# Patient Record
Sex: Female | Born: 1950 | Race: White | Hispanic: No | Marital: Married | Smoking: Never smoker
Health system: Southern US, Community
[De-identification: ages and names within clinical notes are randomized; demographics above are authoritative.]

## PROBLEM LIST (undated history)

## (undated) DIAGNOSIS — R87619 Unspecified abnormal cytological findings in specimens from cervix uteri: Secondary | ICD-10-CM

## (undated) DIAGNOSIS — E559 Vitamin D deficiency, unspecified: Secondary | ICD-10-CM

## (undated) DIAGNOSIS — Z860101 Personal history of adenomatous and serrated colon polyps: Secondary | ICD-10-CM

## (undated) DIAGNOSIS — I4891 Unspecified atrial fibrillation: Secondary | ICD-10-CM

## (undated) DIAGNOSIS — Z86010 Personal history of colonic polyps: Secondary | ICD-10-CM

## (undated) DIAGNOSIS — D72819 Decreased white blood cell count, unspecified: Secondary | ICD-10-CM

## (undated) DIAGNOSIS — M1990 Unspecified osteoarthritis, unspecified site: Secondary | ICD-10-CM

## (undated) DIAGNOSIS — G43009 Migraine without aura, not intractable, without status migrainosus: Secondary | ICD-10-CM

## (undated) DIAGNOSIS — E785 Hyperlipidemia, unspecified: Secondary | ICD-10-CM

## (undated) DIAGNOSIS — Z9889 Other specified postprocedural states: Secondary | ICD-10-CM

## (undated) DIAGNOSIS — M779 Enthesopathy, unspecified: Secondary | ICD-10-CM

## (undated) DIAGNOSIS — Z5189 Encounter for other specified aftercare: Secondary | ICD-10-CM

## (undated) DIAGNOSIS — N809 Endometriosis, unspecified: Secondary | ICD-10-CM

## (undated) HISTORY — DX: Migraine without aura, not intractable, without status migrainosus: G43.009

## (undated) HISTORY — DX: Decreased white blood cell count, unspecified: D72.819

## (undated) HISTORY — DX: Unspecified abnormal cytological findings in specimens from cervix uteri: R87.619

## (undated) HISTORY — DX: Vitamin D deficiency, unspecified: E55.9

## (undated) HISTORY — DX: Enthesopathy, unspecified: M77.9

## (undated) HISTORY — DX: Other specified postprocedural states: Z98.890

## (undated) HISTORY — PX: DIAGNOSTIC LAPAROSCOPY: SUR761

## (undated) HISTORY — DX: Hyperlipidemia, unspecified: E78.5

## (undated) HISTORY — DX: Endometriosis, unspecified: N80.9

## (undated) HISTORY — PX: POLYPECTOMY: SHX149

## (undated) HISTORY — DX: Unspecified atrial fibrillation: I48.91

## (undated) HISTORY — DX: Personal history of colonic polyps: Z86.010

## (undated) HISTORY — DX: Personal history of adenomatous and serrated colon polyps: Z86.0101

## (undated) HISTORY — DX: Encounter for other specified aftercare: Z51.89

## (undated) HISTORY — DX: Unspecified osteoarthritis, unspecified site: M19.90

---
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
## 1989-08-18 HISTORY — PX: TOTAL ABDOMINAL HYSTERECTOMY W/ BILATERAL SALPINGOOPHORECTOMY: SHX83

## 2000-11-05 ENCOUNTER — Other Ambulatory Visit: Admission: RE | Admit: 2000-11-05 | Discharge: 2000-11-05 | Payer: Self-pay | Admitting: Obstetrics and Gynecology

## 2000-11-06 ENCOUNTER — Encounter: Admission: RE | Admit: 2000-11-06 | Discharge: 2000-11-06 | Payer: Self-pay | Admitting: Obstetrics and Gynecology

## 2000-11-06 ENCOUNTER — Encounter: Payer: Self-pay | Admitting: Obstetrics and Gynecology

## 2001-12-12 ENCOUNTER — Encounter: Admission: RE | Admit: 2001-12-12 | Discharge: 2001-12-12 | Payer: Self-pay | Admitting: Obstetrics and Gynecology

## 2001-12-12 ENCOUNTER — Encounter: Payer: Self-pay | Admitting: Obstetrics and Gynecology

## 2002-12-22 ENCOUNTER — Ambulatory Visit (HOSPITAL_COMMUNITY): Admission: RE | Admit: 2002-12-22 | Discharge: 2002-12-22 | Payer: Self-pay | Admitting: *Deleted

## 2003-06-10 ENCOUNTER — Encounter: Admission: RE | Admit: 2003-06-10 | Discharge: 2003-06-10 | Payer: Self-pay | Admitting: Obstetrics and Gynecology

## 2003-06-10 ENCOUNTER — Encounter: Payer: Self-pay | Admitting: Obstetrics and Gynecology

## 2004-07-21 ENCOUNTER — Encounter: Admission: RE | Admit: 2004-07-21 | Discharge: 2004-07-21 | Payer: Self-pay | Admitting: Obstetrics and Gynecology

## 2005-08-22 ENCOUNTER — Encounter: Admission: RE | Admit: 2005-08-22 | Discharge: 2005-08-22 | Payer: Self-pay | Admitting: Obstetrics and Gynecology

## 2006-11-19 ENCOUNTER — Encounter: Admission: RE | Admit: 2006-11-19 | Discharge: 2006-11-19 | Payer: Self-pay | Admitting: Obstetrics and Gynecology

## 2006-11-29 ENCOUNTER — Encounter: Admission: RE | Admit: 2006-11-29 | Discharge: 2006-11-29 | Payer: Self-pay | Admitting: Obstetrics and Gynecology

## 2008-06-11 ENCOUNTER — Encounter: Admission: RE | Admit: 2008-06-11 | Discharge: 2008-06-11 | Payer: Self-pay | Admitting: Obstetrics and Gynecology

## 2009-01-12 ENCOUNTER — Encounter (INDEPENDENT_AMBULATORY_CARE_PROVIDER_SITE_OTHER): Payer: Self-pay | Admitting: *Deleted

## 2009-06-14 ENCOUNTER — Encounter: Admission: RE | Admit: 2009-06-14 | Discharge: 2009-06-14 | Payer: Self-pay | Admitting: Obstetrics and Gynecology

## 2009-08-02 ENCOUNTER — Encounter (INDEPENDENT_AMBULATORY_CARE_PROVIDER_SITE_OTHER): Payer: Self-pay | Admitting: *Deleted

## 2009-08-11 ENCOUNTER — Ambulatory Visit: Payer: Self-pay | Admitting: Gastroenterology

## 2009-08-23 ENCOUNTER — Ambulatory Visit: Payer: Self-pay | Admitting: Gastroenterology

## 2009-08-25 ENCOUNTER — Encounter: Payer: Self-pay | Admitting: Gastroenterology

## 2009-08-25 ENCOUNTER — Telehealth: Payer: Self-pay | Admitting: Gastroenterology

## 2010-06-27 ENCOUNTER — Telehealth: Payer: Self-pay | Admitting: Gastroenterology

## 2010-06-28 ENCOUNTER — Encounter: Admission: RE | Admit: 2010-06-28 | Discharge: 2010-06-28 | Payer: Self-pay | Admitting: Obstetrics and Gynecology

## 2010-07-15 ENCOUNTER — Encounter (INDEPENDENT_AMBULATORY_CARE_PROVIDER_SITE_OTHER): Payer: Self-pay | Admitting: *Deleted

## 2010-08-10 ENCOUNTER — Encounter (INDEPENDENT_AMBULATORY_CARE_PROVIDER_SITE_OTHER): Payer: Self-pay | Admitting: *Deleted

## 2010-08-15 ENCOUNTER — Ambulatory Visit: Payer: Self-pay | Admitting: Gastroenterology

## 2010-08-29 ENCOUNTER — Ambulatory Visit: Payer: Self-pay | Admitting: Gastroenterology

## 2010-08-30 ENCOUNTER — Encounter: Payer: Self-pay | Admitting: Gastroenterology

## 2010-10-09 ENCOUNTER — Encounter: Payer: Self-pay | Admitting: Obstetrics and Gynecology

## 2010-10-09 ENCOUNTER — Encounter: Payer: Self-pay | Admitting: Family Medicine

## 2010-10-18 NOTE — Letter (Signed)
Summary: Recall Colonoscopy Letter  [facility]  [address] [place], [place] [address]   Phone: [phone]  Fax: [phone]      January 12, 2009 MRN: [id number]   [facility] [address] [place], [place]  [address]   Dear Ms. Incorvaia,   According to your medical record, it is time for you to schedule a Colonoscopy. The American Cancer Society recommends this procedure as a method to detect early colon cancer. Patients with a family history of colon cancer, or a personal history of colon polyps or inflammatory bowel disease are at increased risk.  This letter has beeen generated based on the recommendations made at the time of your procedure. If you feel that in your particular situation this may no longer apply, please contact our office.  Please call our office at [phone] to schedule this appointment or to update your records at your earliest convenience.  Thank you for cooperating with [place] to provide you with the very best care possible.   Sincerely,  Vania Rea. Jarold Motto, M.D.  [facility] Gastroenter[facility] Division [phone]

## 2010-10-18 NOTE — Progress Notes (Signed)
Summary: rec COL?  Phone Note Call from Patient Call back at Home Phone [phone]   Caller: Patient Call For: Dr. Jarold Motto Reason for Call: Talk to Nurse Summary of Call: pt wants to know if her recall COL is due 2011 or 2013... two recall dates in [facility] Initial call taken by: Vallarie Mare,  June 27, 2010 8:54 AM  Follow-up for Phone Call        Dr Jarold Motto on her procedure from 08/23/2009. It says that she needs a recall in 2013 then you appened it to say follow up in one year.  when is her recall due?  Follow-up by: Harlow Mares CMA Duncan Dull),  June 28, 2010 8:08 AM  Additional Follow-up for Phone Call Additional follow up Details #1::        one year followup per family history and size of the polyp. Additional Follow-up by: Mardella Layman MD FACG,  June 30, 2010 11:51 AM    Additional Follow-up for Phone Call Additional follow up Details #2::    huband aware. I will send myself a flag to have her colon scheduled in December when that schedule comes out.  Follow-up by: Harlow Mares CMA Duncan Dull),  June 30, 2010 2:05 PM

## 2010-10-18 NOTE — Letter (Signed)
Summary: [facility] Instructions  [facility]  [address] [place], [place] [address]   Phone: [phone]  Fax: [phone]       STEPHENY CANAL    Jun 11, 1951    MRN: [id number]        Procedure Day /Date:   08/29/10   Monday     Arrival Time:  7:30am      Procedure Time:  8:30am     Location of Procedure:                    _x _  [facility] (4th Floor)                        PREPARATION FOR COLONOSCOPY WITH MOVIPREP   Starting 5 days prior to your procedure _12/7/11 _ do not eat nuts, seeds, popcorn, corn, beans, peas,  salads, or any raw vegetables.  Do not take any fiber supplements (e.g. Metamucil, Citrucel, and Benefiber).  THE DAY BEFORE YOUR PROCEDURE         DATE:  08/28/10   DAY:  Sunday  1.  Drink clear liquids the entire day-NO SOLID FOOD  2.  Do not drink anything colored red or purple.  Avoid juices with pulp.  No orange juice.  3.  Drink at least 64 oz. (8 glasses) of fluid/clear liquids during the day to prevent dehydration and help the prep work efficiently.  CLEAR LIQUIDS INCLUDE: Water Jello Ice Popsicles Tea (sugar ok, no milk/cream) Powdered fruit flavored drinks Coffee (sugar ok, no milk/cream) Gatorade Juice: apple, white grape, white cranberry  Lemonade Clear bullion, consomm, broth Carbonated beverages (any kind) Strained chicken noodle soup Hard Candy                             4.  In the morning, mix first dose of MoviPrep solution:    Empty 1 Pouch A and 1 Pouch B into the disposable container    Add lukewarm drinking water to the top line of the container. Mix to dissolve    Refrigerate (mixed solution should be used within 24 hrs)  5.  Begin drinking the prep at 5:00 p.m. The MoviPrep container is divided by 4 marks.   Every 15 minutes drink the solution down to the next mark (approximately 8 oz) until the full liter is complete.   6.  Follow completed prep with 16 oz of clear liquid of your choice  (Nothing red or purple).  Continue to drink clear liquids until bedtime.  7.  Before going to bed, mix second dose of MoviPrep solution:    Empty 1 Pouch A and 1 Pouch B into the disposable container    Add lukewarm drinking water to the top line of the container. Mix to dissolve    Refrigerate  THE DAY OF YOUR PROCEDURE      DATE:   DAY:  08/29/10   Monday  Beginning at  5:30am  (5 hours before procedure):         1. Every 15 minutes, drink the solution down to the next mark (approx 8 oz) until the full liter is complete.  2. Follow completed prep with 16 oz. of clear liquid of your choice.    3. You may drink clear liquids until _6:30am  _ (2 HOURS BEFORE PROCEDURE).   MEDICATION INSTRUCTIONS  Unless otherwise instructed, you should take regular prescription  medications with a small sip of water   as early as possible the morning of your procedure.           OTHER INSTRUCTIONS  You will need a responsible adult at least 60 years of age to accompany you and drive you home.   This person must remain in the waiting room during your procedure.  Wear loose fitting clothing that is easily removed.  Leave jewelry and other valuables at home.  However, you may wish to bring a book to read or  an iPod/MP3 player to listen to music as you wait for your procedure to start.  Remove all body piercing jewelry and leave at home.  Total time from sign-in until discharge is approximately 2-3 hours.  You should go home directly after your procedure and rest.  You can resume normal activities the  day after your procedure.  The day of your procedure you should not:   Drive   Make legal decisions   Operate machinery   Drink alcohol   Return to work  You will receive specific instructions about eating, activities and medications before you leave.    The above instructions have been reviewed and explained to me by   Sherren Kerns RN  August 15, 2010 1:22 PM     I  fully understand and can verbalize these instructions _____________________________ Date _________

## 2010-10-18 NOTE — Progress Notes (Signed)
Summary: Procedure f/i  Phone Note Call from Patient Call back at [phone]   Caller: Patient Call For: Dr. Jarold Motto Reason for Call: Talk to Nurse Summary of Call: Pt had colonoscopy on Monday and have a couple of questions regarding the procedure Initial call taken by: Karna Christmas,  August 25, 2009 4:50 PM  Follow-up for Phone Call        Pt states she was told after proc was done that she should not take anything other than tylenol for pain for one week.  Pt states she has a bad headache and tylenol does her no good.  Asking if she can take something like Motrin?  Had proc on Monday.   Polyp removed.   Follow-up by: Ashok Cordia RN,  August 26, 2009 8:22 AM  Additional Follow-up for Phone Call Additional follow up Details #1::        no nsaid's or asa...headache i care problem...tylonol should suffice... Additional Follow-up by: Mardella Layman MD [facility],  August 26, 2009 8:30 AM    Additional Follow-up for Phone Call Additional follow up Details #2::    Pt notified.  Discussed pathology report with pt also.   Follow-up by: Ashok Cordia RN,  August 26, 2009 8:51 AM

## 2010-10-18 NOTE — Miscellaneous (Signed)
Summary: previsit prep/rm  Clinical Lists Changes  Medications: Added new medication of MOVIPREP 100 GM  SOLR (PEG-KCL-NACL-NASULF-NA ASC-C) As per prep instructions. - Signed Rx of MOVIPREP 100 GM  SOLR (PEG-KCL-NACL-NASULF-NA ASC-C) As per prep instructions.;  #1 x 0;  Signed;  Entered by: Sherren Kerns RN;  Authorized by: Mardella Layman MD [facility];  Method used: Print then Give to Patient Observations: Added new observation of ALLERGY REV: Done (08/15/2010 12:57)    Prescriptions: MOVIPREP 100 GM  SOLR (PEG-KCL-NACL-NASULF-NA ASC-C) As per prep instructions.  #1 x 0   Entered by:   Sherren Kerns RN   Authorized by:   Mardella Layman MD [facility]   Signed by:   Sherren Kerns RN on 08/15/2010   Method used:   Print then Give to Patient   RxID:   [id number]

## 2010-10-18 NOTE — Letter (Signed)
Summary: Pre Visit Letter Revised  [facility]  [address] [place], [place] [address]   Phone: [phone]  Fax: [phone]        07/15/2010 MRN: [id number] Krista Henderson [address] [place], [place]  [address]  [place]             Procedure Date:  08/29/2010   Welcome to the Gastroenterology Division at [facility].    You are scheduled to see a nurse for your pre-procedure visit on 08/15/2010 at 1:00pm on the 3rd floor at [facility], [address]  We ask that you try to arrive at our office 15 minutes prior to your appointment time to allow for check-in.  Please take a minute to review the attached form.  If you answer "Yes" to one or more of the questions on the first page, we ask that you call the person listed at your earliest opportunity.  If you answer "No" to all of the questions, please complete the rest of the form and bring it to your appointment.    Your nurse visit will consist of discussing your medical and surgical history, your immediate family medical history, and your medications.   If you are unable to list all of your medications on the form, please bring the medication bottles to your appointment and we will list them.  We will need to be aware of both prescribed and over the counter drugs.  We will need to know exact dosage information as well.    Please be prepared to read and sign documents such as consent forms, a financial agreement, and acknowledgement forms.  If necessary, and with your consent, a friend or relative is welcome to sit-in on the nurse visit with you.  Please bring your insurance card so that we may make a copy of it.  If your insurance requires a referral to see a specialist, please bring your referral form from your primary care physician.  No co-pay is required for this nurse visit.     If you cannot keep your appointment, please call [phone] to cancel or reschedule prior to your appointment date.  This  allows [place] the opportunity to schedule an appointment for another patient in need of care.    Thank you for choosing [facility] for your medical needs.  We appreciate the opportunity to care for you.  Please visit [place] at our website  to learn more about our practice.  Sincerely, The Gastroenterology Division

## 2010-10-18 NOTE — Letter (Signed)
Summary: Patient Notice- Polyp Results   Gastroenterology  [address] [place], [place] [address]   Phone: [phone]  Fax: [phone]        August 25, 2009 MRN: [id number]    [facility] [address] [place], [place]  [address]    Dear Krista Henderson,  I am pleased to inform you that the colon polyp(s) removed during your recent colonoscopy was (were) found to be benign (no cancer detected) upon pathologic examination.  I recommend you have a repeat colonoscopy examination in 3_ years to look for recurrent polyps, as having colon polyps increases your risk for having recurrent polyps or even colon cancer in the future.  Should you develop new or worsening symptoms of abdominal pain, bowel habit changes or bleeding from the rectum or bowels, please schedule an evaluation with either your primary care physician or with me.  Additional information/recommendations:  xx__ No further action with gastroenterology is needed at this time. Please      follow-up with your primary care physician for your other healthcare      needs.  __ Please call [phone] to schedule a return visit to review your      situation.  __ Please keep your follow-up visit as already scheduled.  __ Continue treatment plan as outlined the day of your exam.  Please call us if you are having persistent problems or have questions about your condition that have not been fully answered at this time.  Sincerely,  Mardella Layman MD [facility]  This letter has been electronically signed by your physician.  Appended Document: Patient Notice- Polyp Results Letter mailed 12.09.10.

## 2010-10-18 NOTE — Miscellaneous (Signed)
Summary: [facility] Previsit/prep  Clinical Lists Changes  Medications: Added new medication of MOVIPREP 100 GM  SOLR (PEG-KCL-NACL-NASULF-NA ASC-C) As per prep instructions. - Signed Rx of MOVIPREP 100 GM  SOLR (PEG-KCL-NACL-NASULF-NA ASC-C) As per prep instructions.;  #1 x 0;  Signed;  Entered by: [name] RN;  Authorized by: Mardella Layman MD [facility];  Method used: Print then Give to Patient Allergies: Added new allergy or adverse reaction of CODEINE Observations: Added new observation of NKA: F (08/11/2009 11:04)    Prescriptions: MOVIPREP 100 GM  SOLR (PEG-KCL-NACL-NASULF-NA ASC-C) As per prep instructions.  #1 x 0   Entered by:   [name] RN   Authorized by:   Mardella Layman MD [facility]   Signed by:   [name] RN on 08/11/2009   Method used:   Print then Give to Patient   RxID:   [id number]

## 2010-10-18 NOTE — Letter (Signed)
Summary: [facility] Instructions  [facility]  [address] [place], [place] [address]   Phone: [phone]  Fax: [phone]       Krista Henderson    1951-03-19    MRN: [id number]        Procedure Day /Date: 08/23/2009     Arrival Time: 7:30 am      Procedure Time: 8:30am     Location of Procedure:                    [name] _  [name] (4th Floor)   PREPARATION FOR COLONOSCOPY WITH MOVIPREP   Starting 5 days prior to your procedure 08/18/09 do not eat nuts, seeds, popcorn, corn, beans, peas,  salads, or any raw vegetables.  Do not take any fiber supplements (e.g. Metamucil, Citrucel, and Benefiber).  THE DAY BEFORE YOUR PROCEDURE         DATE: 08/22/09  DAY: Sunday 1.  Drink clear liquids the entire day-NO SOLID FOOD  2.  Do not drink anything colored red or purple.  Avoid juices with pulp.  No orange juice.  3.  Drink at least 64 oz. (8 glasses) of fluid/clear liquids during the day to prevent dehydration and help the prep work efficiently.  CLEAR LIQUIDS INCLUDE: Water Jello Ice Popsicles Tea (sugar ok, no milk/cream) Powdered fruit flavored drinks Coffee (sugar ok, no milk/cream) Gatorade Juice: apple, white grape, white cranberry  Lemonade Clear bullion, consomm, broth Carbonated beverages (any kind) Strained chicken noodle soup Hard Candy                             4.  In the morning, mix first dose of MoviPrep solution:    Empty 1 Pouch A and 1 Pouch B into the disposable container    Add lukewarm drinking water to the top line of the container. Mix to dissolve    Refrigerate (mixed solution should be used within 24 hrs)  5.  Begin drinking the prep at 5:00 p.m. The MoviPrep container is divided by 4 marks.   Every 15 minutes drink the solution down to the next mark (approximately 8 oz) until the full liter is complete.   6.  Follow completed prep with 16 oz of clear liquid of your choice (Nothing red or purple).  Continue to  drink clear liquids until bedtime.  7.  Before going to bed, mix second dose of MoviPrep solution:    Empty 1 Pouch A and 1 Pouch B into the disposable container    Add lukewarm drinking water to the top line of the container. Mix to dissolve    Refrigerate  THE DAY OF YOUR PROCEDURE      DATE:   08/23/09 DAY: Monday  Beginning at 3:30 a.m. (5 hours before procedure):        1. Every 15 minutes, drink the solution down to the next mark (approx 8 oz) until the full liter is complete.  2. Follow completed prep with 16 oz. of clear liquid of your choice.    3. You may drink clear liquids until 6:30AM (2 HOURS BEFORE PROCEDURE).   MEDICATION INSTRUCTIONS  Unless otherwise instructed, you should take regular prescription medications with a small sip of water   as early as possible the morning of your procedure.           OTHER INSTRUCTIONS  You will need a responsible adult at least  60 years of age to accompany you and drive you home.   This person must remain in the waiting room during your procedure.  Wear loose fitting clothing that is easily removed.  Leave jewelry and other valuables at home.  However, you may wish to bring a book to read or  an iPod/MP3 player to listen to music as you wait for your procedure to start.  Remove all body piercing jewelry and leave at home.  Total time from sign-in until discharge is approximately 2-3 hours.  You should go home directly after your procedure and rest.  You can resume normal activities the  day after your procedure.  The day of your procedure you should not:   Drive   Make legal decisions   Operate machinery   Drink alcohol   Return to work  You will receive specific instructions about eating, activities and medications before you leave.    The above instructions have been reviewed and explained to me by   [name] RN  August 11, 2009 11:49 AM     I fully understand and can verbalize these  instructions _____________________________ Date _________

## 2010-10-20 NOTE — Procedures (Signed)
Summary: Colonoscopy  Patient: Krista Henderson Note: All result statuses are Final unless otherwise noted.  Tests: (1) Colonoscopy (COL)   COL Colonoscopy           DONE     [facility]     [address]     [place], [place]  [address]           COLONOSCOPY PROCEDURE REPORT           PATIENT:  Krista Henderson, Faith  MR#:  [id number]     BIRTHDATE:  06-12-1951, 59 yrs. old  GENDER:  female     ENDOSCOPIST:  Vania Rea. Jarold Motto, MD, [facility]     REF. BY:  Rudi Heap, M.D.     PROCEDURE DATE:  08/29/2010     PROCEDURE:  Colonoscopy with snare polypectomy     ASA CLASS:  Class I     INDICATIONS:  family history of colon cancer, history of     pre-cancerous (adenomatous) colon polyps     MEDICATIONS:   Fentanyl 75 mcg IV, Versed 8 mg IV           DESCRIPTION OF PROCEDURE:   After the risks benefits and     alternatives of the procedure were thoroughly explained, informed     consent was obtained.  Digital rectal exam was performed and     revealed no abnormalities.   The LB [id number] endoscope     was introduced through the anus and advanced to the cecum, which     was identified by both the appendix and ileocecal valve, without     limitations.  The quality of the prep was excellent, using     MoviPrep.  The instrument was then slowly withdrawn as the colon     was fully examined.     <<PROCEDUREIMAGES>>           FINDINGS:  There were multiple polyps identified and removed. in     the sigmoid to descending colon segments. 3-4 mm flat sessile     polyps snare excised.  Moderate diverticulosis was found in the     sigmoid to descending colon segments.   Retroflexed views in the     rectum revealed no abnormalities.    The scope was then withdrawn     from the patient and the procedure completed.           COMPLICATIONS:  None     ENDOSCOPIC IMPRESSION:     1) Polyps, multiple in the sigmoid to descending colon segments           2) Moderate diverticulosis in the sigmoid to  descending colon     segments     recurrent adenomas and ++FH OF COLON CA.     RECOMMENDATIONS:     1) Repeat Colonoscopy in 3 years.     2) high fiber diet     REPEAT EXAM:  No           ______________________________     Vania Rea. Jarold Motto, MD, [facility]           CC:           n.     eSIGNED:   Vania Rea. Patterson at 08/29/2010 09:04 AM           Leslee Home, [id number]  Note: An exclamation mark (!) indicates a result that was not dispersed into the flowsheet. Document Creation Date: 08/29/2010 9:05 AM _______________________________________________________________________  (  1) Order result status: Final Collection or observation date-time: 08/29/2010 08:56 Requested date-time:  Receipt date-time:  Reported date-time:  Referring Physician:   Ordering Physician: Sheryn Bison [phone]) Specimen Source:  Source: Launa Grill Order Number: [id number] Lab site:   Appended Document: Colonoscopy three-year followup  Appended Document: Colonoscopy     Procedures Next Due Date:    Colonoscopy: 08/2013

## 2010-10-20 NOTE — Letter (Signed)
Summary: Patient Notice- Polyp Results  [facility]  [address] [place], [place] [address]   Phone: [phone]  Fax: [phone]        August 30, 2010 MRN: [id number]    [facility] [address] [place], [place]  [address]    Dear Ms. Lesperance,  I am pleased to inform you that the colon polyp(s) removed during your recent colonoscopy was (were) found to be benign (no cancer detected) upon pathologic examination.  I recommend you have a repeat colonoscopy examination in 3_ years to look for recurrent polyps, as having colon polyps increases your risk for having recurrent polyps or even colon cancer in the future.  Should you develop new or worsening symptoms of abdominal pain, bowel habit changes or bleeding from the rectum or bowels, please schedule an evaluation with either your primary care physician or with me.  Additional information/recommendations:  _xx_ No further action with gastroenterology is needed at this time. Please      follow-up with your primary care physician for your other healthcare      needs.  __ Please call [phone] to schedule a return visit to review your      situation.  __ Please keep your follow-up visit as already scheduled.  __ Continue treatment plan as outlined the day of your exam.  Please call us if you are having persistent problems or have questions about your condition that have not been fully answered at this time.  Sincerely,  Mardella Layman MD [facility]  This letter has been electronically signed by your physician.  Appended Document: Patient Notice- Polyp Results Letter Mailed

## 2011-02-03 NOTE — Procedures (Signed)
NAME:  Krista Henderson, Krista Henderson                          ACCOUNT NO.:  [id number]   MEDICAL RECORD NO.:  [id number]                   PATIENT TYPE:  OUT   LOCATION:  [facility]                                 FACILITY:  [facility]   PHYSICIAN:  Vida Roller, M.D.                DATE OF BIRTH:  1950/10/03   DATE OF PROCEDURE:  12/22/2002  DATE OF DISCHARGE:                                  ECHOCARDIOGRAM   PROCEDURE:  Stress echocardiogram.   TAPE NUMBER:  [id number]   TAPE COUNT:  [id number]   CLINICAL INFORMATION:  This is a 60 year old woman with cardiac risk factors  of hyperlipidemia. She presented for atypical chest discomfort evaluation.   DETAILS OF THE PROCEDURE:  The patient was imaged in the supine position and  left lateral decubitus position where images of the left ventricle were  performed in the parasternal-long and parasternal-short, apical-4 and apical-  2 chamber views.  These views were used in a loop format as well as recorded  on tape for comparison with the stress images.  The patient then exercised  on a Bruce treadmill.  When she attained her maximum predicted heart rate,  she was immediately moved back to the echo table where echocardiographic  images were again obtained in the parasternal-long and parasternal-short and  apical-4 and apical-2 chambers; and these views were compared with the rest  of the views to assess for wall motion abnormality.   IMAGES:  The left ventricle is normal size with normal systolic function at  rest.  There are no wall motion abnormalities.  Overall ejection fraction is  estimated at 60%.  When the patient exercises on a Bruce protocol her left  ventricular chamber size decreases appropriately.  She has appropriate  augmentation of systolic function; and no significant stress induced wall  motion abnormalities are seen.   STRESS TEST:  The patient exercised on a Bruce protocol for 5-minutes  attaining 7 METS of exercise.  Her resting heart rate  went from 76 to 174  beats/minute which is 103% of her maximum predicted heart rate for her age.  Her blood pressure went from 142/88 to 180/92.  During that time she  experienced bilateral leg fatigue with no chest pain and no shortness of  breath. She had no evidence of arrythmia or ischemic ST-T wave changes on  her electrocardiogram.  The reason for stopping the test was fatigue and  attaining over 100% of the maximum predicted heart rate.   ASSESSMENT:  There is no evidence of hemodynamically significant coronary  artery disease.  The overall poor exercise tolerance; however, limits the  sensitivity and specificity of the test somewhat.  Vida Roller, M.D.    JH/MEDQ  D:  12/22/2002  T:  12/23/2002  Job:  [id number]

## 2011-02-03 NOTE — Procedures (Signed)
   NAME:  Krista Henderson, Krista Henderson                          ACCOUNT NO.:  [id number]   MEDICAL RECORD NO.:  [id number]                   PATIENT TYPE:  OUT   LOCATION:  [facility]                                 FACILITY:  [facility]   PHYSICIAN:  Jae Dire, P.A. [facility]               DATE OF BIRTH:  05-May-1951   DATE OF PROCEDURE:  DATE OF DISCHARGE:                                    STRESS TEST   PROCEDURE:  Stress echocardiogram   INDICATIONS:  Ms. Deane is a 60 year old female with no known coronary  artery disease.  She presented with atypical chest discomfort and minimal  cardiac risk factors.   BASELINE DATA:  EKG shows sinus rhythm at 76 beats/minute with nonspecific  ST abnormalities.  Her blood pressure is 142/88.   DESCRIPTION:  The patient exercised for a total of 5 minutes to 7.0 METS in  Bruce Protocol stage 2.   STRESS DATA:  1. Maximum heart rate was 274 beats/minute with is 103% of predicted     maximum.  2. Maximum blood pressure is 180/92.  3. EKG showed no arrhythmias and no ischemic changes.  4. Echocardiogram and final results are pending MD review.                                               Jae Dire, P.A. [facility]    AB/MEDQ  D:  12/22/2002  T:  12/22/2002  Job:  [id number]

## 2011-06-26 ENCOUNTER — Other Ambulatory Visit: Payer: Self-pay | Admitting: Obstetrics and Gynecology

## 2011-06-26 DIAGNOSIS — Z1231 Encounter for screening mammogram for malignant neoplasm of breast: Secondary | ICD-10-CM

## 2011-07-11 ENCOUNTER — Ambulatory Visit: Payer: Self-pay

## 2011-07-20 ENCOUNTER — Ambulatory Visit
Admission: RE | Admit: 2011-07-20 | Discharge: 2011-07-20 | Disposition: A | Discharge status: Home / Self Care | Payer: BC Managed Care – PPO | Source: Ambulatory Visit | Attending: Obstetrics and Gynecology | Admitting: Obstetrics and Gynecology

## 2011-07-20 DIAGNOSIS — Z1231 Encounter for screening mammogram for malignant neoplasm of breast: Secondary | ICD-10-CM

## 2011-07-25 ENCOUNTER — Other Ambulatory Visit: Payer: Self-pay | Admitting: Obstetrics and Gynecology

## 2011-07-25 DIAGNOSIS — R928 Other abnormal and inconclusive findings on diagnostic imaging of breast: Secondary | ICD-10-CM

## 2011-08-02 ENCOUNTER — Ambulatory Visit
Admission: RE | Admit: 2011-08-02 | Discharge: 2011-08-02 | Disposition: A | Discharge status: Home / Self Care | Payer: BC Managed Care – PPO | Source: Ambulatory Visit | Attending: Obstetrics and Gynecology | Admitting: Obstetrics and Gynecology

## 2011-08-02 DIAGNOSIS — R928 Other abnormal and inconclusive findings on diagnostic imaging of breast: Secondary | ICD-10-CM

## 2012-07-04 ENCOUNTER — Other Ambulatory Visit: Payer: Self-pay | Admitting: Obstetrics and Gynecology

## 2012-07-04 DIAGNOSIS — Z1231 Encounter for screening mammogram for malignant neoplasm of breast: Secondary | ICD-10-CM

## 2012-08-05 ENCOUNTER — Ambulatory Visit
Admission: RE | Admit: 2012-08-05 | Discharge: 2012-08-05 | Disposition: A | Discharge status: Home / Self Care | Payer: BC Managed Care – PPO | Source: Ambulatory Visit | Attending: Obstetrics and Gynecology | Admitting: Obstetrics and Gynecology

## 2012-08-05 DIAGNOSIS — Z1231 Encounter for screening mammogram for malignant neoplasm of breast: Secondary | ICD-10-CM

## 2012-08-13 ENCOUNTER — Telehealth: Payer: Self-pay | Admitting: Oncology

## 2012-08-13 NOTE — Telephone Encounter (Signed)
C/D 08/13/12 for appt.09/04/12

## 2012-08-13 NOTE — Telephone Encounter (Signed)
S/W PT IN REF TO NP APPT ON 09/04/12@1 :30 REFERRING DR Rudi Heap DX-LEUKOPENIA MAILED NP PACKET

## 2012-08-16 ENCOUNTER — Other Ambulatory Visit: Payer: Self-pay | Admitting: Oncology

## 2012-08-16 ENCOUNTER — Encounter: Payer: Self-pay | Admitting: Oncology

## 2012-08-16 DIAGNOSIS — D72819 Decreased white blood cell count, unspecified: Secondary | ICD-10-CM

## 2012-08-16 HISTORY — DX: Decreased white blood cell count, unspecified: D72.819

## 2012-09-04 ENCOUNTER — Other Ambulatory Visit (HOSPITAL_BASED_OUTPATIENT_CLINIC_OR_DEPARTMENT_OTHER): Payer: BC Managed Care – PPO | Admitting: Lab

## 2012-09-04 ENCOUNTER — Ambulatory Visit (HOSPITAL_BASED_OUTPATIENT_CLINIC_OR_DEPARTMENT_OTHER): Payer: BC Managed Care – PPO | Admitting: Oncology

## 2012-09-04 ENCOUNTER — Ambulatory Visit: Payer: BC Managed Care – PPO

## 2012-09-04 VITALS — BP 150/79 | HR 73 | Temp 98.2°F | Resp 20 | Ht 65.0 in | Wt 168.4 lb

## 2012-09-04 DIAGNOSIS — D72819 Decreased white blood cell count, unspecified: Secondary | ICD-10-CM

## 2012-09-04 DIAGNOSIS — K7460 Unspecified cirrhosis of liver: Secondary | ICD-10-CM

## 2012-09-04 LAB — CBC & DIFF AND RETIC
BASO%: 0.6 % (ref 0.0–2.0)
Basophils Absolute: 0 10*3/uL (ref 0.0–0.1)
EOS%: 1.4 % (ref 0.0–7.0)
Eosinophils Absolute: 0.1 10*3/uL (ref 0.0–0.5)
HCT: 35.7 % (ref 34.8–46.6)
HGB: 12.4 g/dL (ref 11.6–15.9)
Immature Retic Fract: 5.7 % (ref 1.60–10.00)
LYMPH%: 35.3 % (ref 14.0–49.7)
MCH: 32.5 pg (ref 25.1–34.0)
MCHC: 34.7 g/dL (ref 31.5–36.0)
MCV: 93.7 fL (ref 79.5–101.0)
MONO#: 0.2 10*3/uL (ref 0.1–0.9)
MONO%: 6.9 % (ref 0.0–14.0)
NEUT#: 1.9 10*3/uL (ref 1.5–6.5)
NEUT%: 55.8 % (ref 38.4–76.8)
Platelets: 165 10*3/uL (ref 145–400)
RBC: 3.81 10*6/uL (ref 3.70–5.45)
RDW: 12.1 % (ref 11.2–14.5)
Retic %: 1.7 % (ref 0.70–2.10)
Retic Ct Abs: 64.77 10*3/uL (ref 33.70–90.70)
WBC: 3.5 10*3/uL — ABNORMAL LOW (ref 3.9–10.3)
lymph#: 1.2 10*3/uL (ref 0.9–3.3)

## 2012-09-04 LAB — CHCC SMEAR

## 2012-09-04 LAB — MORPHOLOGY
PLT EST: ADEQUATE
RBC Comments: NORMAL

## 2012-09-04 NOTE — Progress Notes (Signed)
New Patient Hematology-Oncology Evaluation   Krista Henderson [id number] 1950-09-24 61 y.o. 09/04/2012  CC: Dr. 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 She has been leukopenic for at least 5 years. She brings laboratory data back as far as 03/08/2007 when total white count was 3000 with 55% neutrophils 36% lymphocytes 9 monocytes, hemoglobin 13, and platelet count 159,000. Fluctuated over Time with Lowest White Count Recorded 2600 Done Just Last Month on November 12. Hemoglobin and Platelets Have Remained Stable. Liver Chemistries Also Done November 13 Are Normal. She Has No History of Hepatitis Yellow Jaundice or Malaria but Did Have a Bad Case of Mononucleosis When She Was 61 Years Old. A Hepatitis Profile Done through Dr. Kathi [facility] Was Negative. She Has No Signs or Symptoms of a Collagen Vascular Disorder and Specifically denies polymyalgia, polyarthralgia, hair loss, skin rashes, or easy bruisability. 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 She did not do any of the spraying but did help harvest the tobacco. No exposure to organic solvents or therapeutic radiation. I took care of her mother who I initially thought that had a myelodysplastic syndrome characterized by thrombocytopenia and splenomegaly. As her situation even involved, it appeared that she actually had cirrhosis as the etiology of her splenomegaly.  She does not get frequent or severe infections.   PMH: Past Medical History  Diagnosis Date  . 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    WBC 3,000 08/30/11 52 P 43 L    No past surgical history on file.  Allergies: Allergies  Allergen Reactions  . Codeine     REACTION: rash/nausea/vomiting    Medications: Calcium and vitamin D supplements. Estrogen patch. GI intolerance  to codeine   Social History:  She is married. She had C-sections with both of her children a son currently age 73 and a daughter 35. They're both healthy. She does not smoke or use alcohol. She works as an Print production planner.  Family History: Father with history of prostate and colon cancer. Mother with history of cirrhosis and stroke. She is an only child.  Review of Systems: Constitutional symptoms: No constitutional symptoms HEENT: No sore throat Respiratory: No cough or dyspnea Cardiovascular:  No chest pain or palpitations Gastrointestinal ROS: No change in bowel habit Genito-Urinary ROS: No hematuria or vaginal bleeding Hematological and Lymphatic: No swollen glands Musculoskeletal: Some arthritis in her right foot status post previous fracture Neurologic: No headache or change in vision, no paresthesias Dermatologic: No rash or ecchymosis no easy bruisability Remaining ROS negative.  Physical Exam: Blood pressure 150/79, pulse 73, temperature 98.2 F (36.8 C), temperature source Oral, resp. rate 20, height 5\' 5"  (1.651 m), weight 168 lb 6.4 oz (76.386 kg). Wt Readings from Last 3 Encounters:  09/04/12 168 lb 6.4 oz (76.386 kg)    General appearance: Well-nourished Caucasian woman Head: Normal Neck: Normal Lymph nodes: No adenopathy Breasts: Lungs: Clear to auscultation resonant to percussion Heart: Regular rhythm no murmur Abdominal: Soft, obese, nontender, no mass, no organomegaly GU: Extremities: No edema, no calf tenderness Neurologic: No focal deficit Skin: No rash or ecchymosis    Lab Results: Lab Results  Component Value Date   WBC 3.5* 09/04/2012   HGB 12.4 09/04/2012   HCT 35.7 09/04/2012   MCV 93.7 09/04/2012   PLT 165 09/04/2012  Chemistry   No results found for this basename: NA, K, CL, CO2, BUN, CREATININE, GLU   No results found for this basename: CALCIUM, ALKPHOS, AST, ALT, BILITOT       Review of peripheral blood film: Normochromic  normocytic red cells, mature white cells, subpopulation of benign reactive, large granular lymphocytes. Platelets normal    Impression and Plan: Chronic, stable, leukopenia over at least a five-year interval of observation.  She gives a history of mononucleosis as a teenager. She has a subpopulation of large granular lymphocytes on review of the peripheral blood film. Although it is guilt by association, I believe her leukopenia is likely related to chronic suppression of white cells by previous mononucleosis infection. She's not getting any recurrent or severe infections. There are no progressive changes in her other cell lines over time. I doubt that she has a myelodysplastic syndrome. There are no signs or symptoms of a collagen vascular disorder.  Recommendation: Observation alone I will. see her back in one year.      Levert Feinstein, MD 09/04/2012, 3:21 PM

## 2012-09-05 ENCOUNTER — Telehealth: Payer: Self-pay | Admitting: Oncology

## 2012-09-05 LAB — EPSTEIN-BARR VIRUS EARLY D ANTIGEN ANTIBODY, IGG: EBV EA IgG: 8.4 U/mL (ref <9.0)

## 2012-09-05 LAB — EPSTEIN-BARR VIRUS NUCLEAR ANTIGEN ANTIBODY, IGG: EBV NA IgG: >600 U/mL — ABNORMAL HIGH (ref <18.0)

## 2012-09-05 LAB — EPSTEIN-BARR VIRUS VCA, IGG: EBV VCA IgG: 43 U/mL — ABNORMAL HIGH (ref <18.0)

## 2012-09-05 LAB — EPSTEIN-BARR VIRUS VCA, IGM: EBV VCA IgM: 15.5 U/mL (ref <36.0)

## 2012-09-05 NOTE — Telephone Encounter (Signed)
Called pt left message regarding lab and MD appt on December 2014

## 2012-09-06 ENCOUNTER — Telehealth: Payer: Self-pay | Admitting: *Deleted

## 2012-09-06 ENCOUNTER — Telehealth: Payer: Self-pay | Admitting: Oncology

## 2012-09-06 LAB — ANA: Anti Nuclear Antibody(ANA): NEGATIVE

## 2012-09-06 LAB — CYTOMEGALOVIRUS ANTIBODY, IGG: Cytomegalovirus Ab-IgG: >6 — ABNORMAL HIGH (ref <0.90)

## 2012-09-06 LAB — CMV IGM: CMV IgM: 0 (ref <0.90)

## 2012-09-06 NOTE — Telephone Encounter (Signed)
Pt called and wants to r/s appt from 09/02/13 to 09/16/13 lab and MD

## 2012-09-06 NOTE — Telephone Encounter (Signed)
Message left on identified vm per Dr. Patsy Lager instructions.

## 2012-09-06 NOTE — Telephone Encounter (Signed)
Message copied by Sabino Snipes on Fri Sep 06, 2012  4:39 PM ------      Message from: Levert Feinstein      Created: Fri Sep 06, 2012  8:34 AM       Call pt - evidence of exposure to mononucleosis (which we know already) but no signs of active infection. No change in plan which is observation alone

## 2013-01-14 ENCOUNTER — Telehealth: Payer: Self-pay | Admitting: Obstetrics and Gynecology

## 2013-01-14 DIAGNOSIS — D72819 Decreased white blood cell count, unspecified: Secondary | ICD-10-CM

## 2013-01-14 MED ORDER — ESTRADIOL 0.0375 MG/24HR TD PTTW: 1.0000 | MEDICATED_PATCH | TRANSDERMAL | Status: DC

## 2013-01-14 NOTE — Telephone Encounter (Signed)
Pt needing rx for vivelle dot 3 month supply to [facility] at [phone]

## 2013-01-14 NOTE — Telephone Encounter (Signed)
Patient was given ok for 1 year of refills and samples at 07/10/12 visit.

## 2013-05-08 ENCOUNTER — Telehealth: Payer: Self-pay | Admitting: Obstetrics and Gynecology

## 2013-05-08 NOTE — Telephone Encounter (Addendum)
Patient is asking is Krista Henderson has gone generic? Patient has a discount card for [facility] and needs a prescription called to  [facility]. [place], [place] [phone])

## 2013-05-09 ENCOUNTER — Other Ambulatory Visit: Payer: Self-pay | Admitting: *Deleted

## 2013-05-09 MED ORDER — ESTRADIOL 0.0375 MG/24HR TD PTTW: 1.0000 | MEDICATED_PATCH | TRANSDERMAL | Status: DC

## 2013-05-09 NOTE — Telephone Encounter (Signed)
Spoke with pt whose Aex is due in October. Pt needs her hormone patch refilled, and is wondering if vivelle has gone generic.One month of vivelle is $101.  Pt's husband's job has ended and she no longer has Rx insurance. Pt needs cheapest alternative. Pt reports minivelle with a discount card will be $43.88 for a month's supply. Pt has not tried minivelle yet and is not sure she will even like it. Pt wondering if we could give her a sample to try. Advised pt that vivelle has gone generic, but I am not sure what the cost would be. Pt will call pharmacy and check. OK for pt to try sample of minivelle?

## 2013-05-09 NOTE — Telephone Encounter (Signed)
It would be fine to give her a month of minivelle samples, if we have them.  As an alternative, she could consider po estrogen.  The generic pill is on the $4 list at [facility] and [facility] and probably many other pharmacies.  It's disadvantage is that it can raise triglyceride levels (the fats in the blood) and carries a small risk of increasing her change of having a blood clot (can happen in leg, lung, heart, or brain).  Many Many women do fine on the pill, and the risks are less than those associated with regular birth control pills.  Her choice.

## 2013-05-09 NOTE — Telephone Encounter (Signed)
Samples at front desk to pick up by patient. Left message for patient to call our office for further information concerning samples of minivelle patches.

## 2013-06-24 ENCOUNTER — Encounter: Payer: Self-pay | Admitting: Gastroenterology

## 2013-07-31 ENCOUNTER — Ambulatory Visit: Payer: Self-pay | Admitting: Obstetrics and Gynecology

## 2013-08-07 ENCOUNTER — Other Ambulatory Visit: Payer: Self-pay | Admitting: Family Medicine

## 2013-08-08 ENCOUNTER — Other Ambulatory Visit: Payer: Self-pay

## 2013-08-08 DIAGNOSIS — Z1231 Encounter for screening mammogram for malignant neoplasm of breast: Secondary | ICD-10-CM

## 2013-08-11 NOTE — Telephone Encounter (Signed)
Last seen in 2013, level was 39

## 2013-08-13 ENCOUNTER — Encounter: Payer: Self-pay | Admitting: Family Medicine

## 2013-08-19 ENCOUNTER — Other Ambulatory Visit: Payer: Self-pay

## 2013-08-20 ENCOUNTER — Other Ambulatory Visit (INDEPENDENT_AMBULATORY_CARE_PROVIDER_SITE_OTHER): Payer: BC Managed Care – PPO

## 2013-08-20 ENCOUNTER — Telehealth: Payer: Self-pay | Admitting: *Deleted

## 2013-08-20 DIAGNOSIS — Z0000 Encounter for general adult medical examination without abnormal findings: Secondary | ICD-10-CM

## 2013-08-20 LAB — POCT CBC
Granulocyte percent: 55.7 %G (ref 37–80)
HCT, POC: 39.3 % (ref 37.7–47.9)
Hemoglobin: 13.1 g/dL (ref 12.2–16.2)
Lymph, poc: 1.2 (ref 0.6–3.4)
MCH, POC: 32.1 pg — AB (ref 27–31.2)
MCHC: 33.4 g/dL (ref 31.8–35.4)
MCV: 96 fL (ref 80–97)
MPV: 8.4 fL (ref 0–99.8)
POC Granulocyte: 1.7 — AB (ref 2–6.9)
POC LYMPH PERCENT: 40.2 %L (ref 10–50)
Platelet Count, POC: 173 10*3/uL (ref 142–424)
RBC: 4.1 M/uL (ref 4.04–5.48)
RDW, POC: 12.3 %
WBC: 3.1 10*3/uL — AB (ref 4.6–10.2)

## 2013-08-20 NOTE — Telephone Encounter (Signed)
Pt notified and verbalized understanding.

## 2013-08-20 NOTE — Telephone Encounter (Signed)
Message copied by Baltazar Apo on Wed Aug 20, 2013 11:08 AM ------      Message from: Ernestina Penna      Created: Wed Aug 20, 2013  9:54 AM       CBC is within normal limits with a slightly decreased white blood cell count, a good hemoglobin of 13.1, and adequate platelets ------

## 2013-08-20 NOTE — Progress Notes (Signed)
Patient came in for labs only.

## 2013-08-22 LAB — BMP8+EGFR
BUN/Creatinine Ratio: 15 (ref 11–26)
BUN: 13 mg/dL (ref 8–27)
CO2: 22 mmol/L (ref 18–29)
Calcium: 9.3 mg/dL (ref 8.6–10.2)
Chloride: 101 mmol/L (ref 97–108)
Creatinine, Ser: 0.86 mg/dL (ref 0.57–1.00)
GFR calc Af Amer: 84 mL/min/{1.73_m2} (ref >59)
GFR calc non Af Amer: 73 mL/min/{1.73_m2} (ref >59)
Glucose: 103 mg/dL — ABNORMAL HIGH (ref 65–99)
Potassium: 4.3 mmol/L (ref 3.5–5.2)
Sodium: 140 mmol/L (ref 134–144)

## 2013-08-22 LAB — HEPATIC FUNCTION PANEL
ALT: 14 IU/L (ref 0–32)
AST: 17 IU/L (ref 0–40)
Albumin: 4.3 g/dL (ref 3.6–4.8)
Alkaline Phosphatase: 73 IU/L (ref 39–117)
Bilirubin, Direct: 0.19 mg/dL (ref 0.00–0.40)
Total Bilirubin: 0.9 mg/dL (ref 0.0–1.2)
Total Protein: 6.3 g/dL (ref 6.0–8.5)

## 2013-08-22 LAB — NMR, LIPOPROFILE
Cholesterol: 210 mg/dL — ABNORMAL HIGH (ref <200)
HDL Cholesterol by NMR: 57 mg/dL (ref >=40)
HDL Particle Number: 40.5 umol/L (ref >=30.5)
LDL Particle Number: 1702 nmol/L — ABNORMAL HIGH (ref <1000)
LDL Size: 21.3 nm (ref >20.5)
LDLC SERPL CALC-MCNC: 115 mg/dL — ABNORMAL HIGH (ref <100)
LP-IR Score: 31 (ref <=45)
Small LDL Particle Number: 671 nmol/L — ABNORMAL HIGH (ref <=527)
Triglycerides by NMR: 190 mg/dL — ABNORMAL HIGH (ref <150)

## 2013-08-22 LAB — VITAMIN D 25 HYDROXY (VIT D DEFICIENCY, FRACTURES): Vit D, 25-Hydroxy: 23.1 ng/mL — ABNORMAL LOW (ref 30.0–100.0)

## 2013-08-26 ENCOUNTER — Telehealth: Payer: Self-pay | Admitting: *Deleted

## 2013-08-26 NOTE — Telephone Encounter (Signed)
Message copied by Monica Becton on Tue Aug 26, 2013  5:19 PM ------      Message from: Ernestina Penna      Created: Fri Aug 22, 2013  1:48 PM       The. blood sugar is elevated at 103. Kidney function is good. Electrolytes including potassium are within normal limits.      All liver function tests are within normal limits      On advanced lipid testing, a total LDL [place] number is elevated at 09/24/2000. This number should be less than 1000. The LDL C. is elevated at 115. The triglycerides are elevated at 190 and these need to be much lower ideally less than 100. The small LDL particle number is also elevated. The HDL particle number, which is the good cholesterol particle is very good.0------- the patient really needs to be started on a statin drug, please let me know if she is willing to do that. If she is to start Crestor 10 one daily. Give her a coupon that will save money. If she is willing to do this she must have her liver function tests checked 4 weeks after starting the medication. She must continue to do aggressive therapeutic lifestyle changes which include diet and exercise. She must also try to watch her carbohydrate intake more as this will help bring the sugar and the triglycerides down.      The vitamin D level is very low-------------- please confirm her current treatment. She should be taking vitamin D 50,000 and units once weekly #12 one refill. Recheck vitamin D level in 3 months ------

## 2013-08-27 ENCOUNTER — Telehealth: Payer: Self-pay | Admitting: *Deleted

## 2013-08-27 ENCOUNTER — Encounter: Payer: Self-pay | Admitting: Family Medicine

## 2013-08-27 NOTE — Telephone Encounter (Signed)
Message copied by Magdalene River on Wed Aug 27, 2013 10:47 AM ------      Message from: Ernestina Penna      Created: Fri Aug 22, 2013  1:48 PM       The. blood sugar is elevated at 103. Kidney function is good. Electrolytes including potassium are within normal limits.      All liver function tests are within normal limits      On advanced lipid testing, a total LDL [place] number is elevated at 09/24/2000. This number should be less than 1000. The LDL C. is elevated at 115. The triglycerides are elevated at 190 and these need to be much lower ideally less than 100. The small LDL particle number is also elevated. The HDL particle number, which is the good cholesterol particle is very good.0------- the patient really needs to be started on a statin drug, please let me know if she is willing to do that. If she is to start Crestor 10 one daily. Give her a coupon that will save money. If she is willing to do this she must have her liver function tests checked 4 weeks after starting the medication. She must continue to do aggressive therapeutic lifestyle changes which include diet and exercise. She must also try to watch her carbohydrate intake more as this will help bring the sugar and the triglycerides down.      The vitamin D level is very low-------------- please confirm her current treatment. She should be taking vitamin D 50,000 and units once weekly #12 one refill. Recheck vitamin D level in 3 months ------

## 2013-08-27 NOTE — Telephone Encounter (Signed)
LM TO GO OVER LABS 

## 2013-08-28 ENCOUNTER — Encounter: Payer: Self-pay | Admitting: Family Medicine

## 2013-09-01 ENCOUNTER — Encounter: Payer: BC Managed Care – PPO | Admitting: Gastroenterology

## 2013-09-02 ENCOUNTER — Telehealth: Payer: Self-pay | Admitting: Family Medicine

## 2013-09-02 ENCOUNTER — Ambulatory Visit: Payer: BC Managed Care – PPO | Admitting: Oncology

## 2013-09-02 ENCOUNTER — Other Ambulatory Visit: Payer: BC Managed Care – PPO | Admitting: Lab

## 2013-09-02 ENCOUNTER — Ambulatory Visit (INDEPENDENT_AMBULATORY_CARE_PROVIDER_SITE_OTHER): Payer: BC Managed Care – PPO

## 2013-09-02 ENCOUNTER — Encounter: Payer: Self-pay | Admitting: Family Medicine

## 2013-09-02 ENCOUNTER — Ambulatory Visit (INDEPENDENT_AMBULATORY_CARE_PROVIDER_SITE_OTHER): Payer: BC Managed Care – PPO | Admitting: Family Medicine

## 2013-09-02 VITALS — BP 145/89 | HR 81 | Temp 98.9°F | Ht 65.0 in | Wt 168.0 lb

## 2013-09-02 DIAGNOSIS — D72819 Decreased white blood cell count, unspecified: Secondary | ICD-10-CM

## 2013-09-02 DIAGNOSIS — Z0000 Encounter for general adult medical examination without abnormal findings: Secondary | ICD-10-CM

## 2013-09-02 DIAGNOSIS — E559 Vitamin D deficiency, unspecified: Secondary | ICD-10-CM | POA: Insufficient documentation

## 2013-09-02 DIAGNOSIS — E785 Hyperlipidemia, unspecified: Secondary | ICD-10-CM | POA: Insufficient documentation

## 2013-09-02 DIAGNOSIS — Z780 Asymptomatic menopausal state: Secondary | ICD-10-CM

## 2013-09-02 DIAGNOSIS — M79672 Pain in left foot: Secondary | ICD-10-CM

## 2013-09-02 NOTE — Telephone Encounter (Signed)
VERIFIED W/ INS DEPT- DEXASCANS NOT COVERED UNDER PREVENTIVE.  PT AWARE.  RS

## 2013-09-02 NOTE — Patient Instructions (Addendum)
Continue current medications. Continue good therapeutic lifestyle changes which include good diet and exercise. Fall precautions discussed with patient. Schedule your flu vaccine if you haven't had it yet If you are over 62 years old - you may need Prevnar 13 or the adult Pneumonia vaccine. Please  consider starting a statin drug--continue with your aggressive therapeutic lifestyle changes, watching your diet and carb intake Followup with your gynecologist, the hematologist, and the gastroenterologist for your colonoscopy. Use a heel cup for your he'll and if the pain continues we may need to do x-rays of your heel to see if there could be any spurs causing the problem

## 2013-09-02 NOTE — Progress Notes (Addendum)
Subjective:    Patient ID: Krista Henderson, female    DOB: Jun 26, 1951, 62 y.o.   MRN: [id number]  HPI Pt here for follow up and management of chronic medical problems. Patient has a history of hyperlipidemia, estrogen replacement therapy and vitamin D deficiency. She also has a history of leukopenia.rrecent labs were reviewed with patient and the findings were discussed. She had understanding of this.     Patient Active Problem List   Diagnosis Date Noted  . 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   Outpatient Encounter Prescriptions as of 09/02/2013  Medication Sig  . Calcium Carbonate-Vitamin D 600-400 MG-UNIT per tablet Take 1 tablet by mouth daily. With magnesium  . estradiol (VIVELLE-DOT) 0.0375 MG/24HR Place 1 patch onto the skin 2 (two) times a week.  . Vitamin D, Ergocalciferol, (DRISDOL) 50000 UNITS CAPS capsule TAKE 1 CAPSULE BY MOUTH EVERY WEEK  . [DISCONTINUED] estradiol (VIVELLE-DOT) 0.0375 MG/24HR Place 1 patch onto the skin 2 (two) times a week. Samles given 05/09/2013 Lot # [id number]  Exp. 10/2013  X 2 boxes    Review of Systems  Constitutional: Negative.   HENT: Negative.   Eyes: Negative.   Respiratory: Negative.   Cardiovascular: Negative.   Gastrointestinal: Negative.   Endocrine: Negative.   Genitourinary: Negative.   Musculoskeletal: Negative.        Left heel pain  Skin: Negative.   Allergic/Immunologic: Negative.   Neurological: Negative.   Hematological: Negative.   Psychiatric/Behavioral: Negative.        Objective:   Physical Exam  Nursing note and vitals reviewed. Constitutional: She is oriented to person, place, and time. She appears well-developed and well-nourished. No distress.  HENT:  Head: Normocephalic and atraumatic.  Right Ear: External ear normal.  Left Ear: External ear normal.  Mouth/Throat: Oropharynx is clear and moist. No oropharyngeal exudate.  Nasal congestion bilateral  Eyes: Conjunctivae and EOM are normal. Pupils are equal, round, and  reactive to light. Right eye exhibits no discharge. Left eye exhibits no discharge. No scleral icterus.  Neck: Normal range of motion. Neck supple. No JVD present. No thyromegaly present.  Cardiovascular: Normal rate, regular rhythm, normal heart sounds and intact distal pulses.  Exam reveals no gallop and no friction rub.   No murmur heard. At 72 per minute  Pulmonary/Chest: Effort normal and breath sounds normal. No respiratory distress. She has no wheezes. She has no rales. She exhibits no tenderness.  Abdominal: Soft. Bowel sounds are normal. She exhibits no mass. There is no tenderness. There is no rebound and no guarding.  Musculoskeletal: Normal range of motion. She exhibits no edema and no tenderness.  Lymphadenopathy:    She has no cervical adenopathy.  Neurological: She is alert and oriented to person, place, and time. She has normal reflexes. No cranial nerve deficit.  Skin: Skin is warm and dry.  Psychiatric: She has a normal mood and affect. Her behavior is normal. Judgment and thought content normal.   BP 145/89  Pulse 81  Temp(Src) 98.9 F (37.2 C) (Oral)  Ht 5\' 5"  (1.651 m)  Wt 168 lb (76.204 kg)  BMI 27.96 kg/m2  [facility] reading (PRIMARY) by  Dr.Moore;-Chest x-ray shows degenerative changes and scoliosis                                        Assessment & Plan:  1. Vitamin D deficiency  2. Hyperlipidemia  3. Leukopenia  4. Annual physical exam  No orders of the defined types were placed in this encounter.   Patient Instructions  Continue current medications. Continue good therapeutic lifestyle changes which include good diet and exercise. Fall precautions discussed with patient. Schedule your flu vaccine if you haven't had it yet If you are over 74 years old - you may need Prevnar 13 or the adult Pneumonia vaccine. Please  consider starting a statin drug--continue with your aggressive therapeutic lifestyle changes, watching your diet and carb  intake Followup with your gynecologist, the hematologist, and the gastroenterologist for your colonoscopy. Use a heel cup for your he'll and if the pain continues we may need to do x-rays of your heel to see if there could be any spurs causing the problem   Nyra Capes MD

## 2013-09-03 ENCOUNTER — Ambulatory Visit: Payer: BC Managed Care – PPO

## 2013-09-03 ENCOUNTER — Other Ambulatory Visit: Payer: BC Managed Care – PPO

## 2013-09-03 ENCOUNTER — Ambulatory Visit: Payer: Self-pay | Admitting: Gynecology

## 2013-09-03 ENCOUNTER — Telehealth: Payer: Self-pay | Admitting: *Deleted

## 2013-09-03 ENCOUNTER — Other Ambulatory Visit: Payer: Self-pay | Admitting: *Deleted

## 2013-09-03 DIAGNOSIS — D72819 Decreased white blood cell count, unspecified: Secondary | ICD-10-CM

## 2013-09-03 NOTE — Telephone Encounter (Signed)
Pt was scheduled for her annual exam 09/03/13 appt was cancelled and not rescheduled.

## 2013-09-03 NOTE — Telephone Encounter (Signed)
Pt was scheduled for appt 09/03/13,  but was cancelled was not rescheduled.

## 2013-09-05 ENCOUNTER — Telehealth: Payer: Self-pay

## 2013-09-05 MED ORDER — ESTRADIOL 0.0375 MG/24HR TD PTTW: 1.0000 | MEDICATED_PATCH | TRANSDERMAL | Status: DC

## 2013-09-05 NOTE — Telephone Encounter (Signed)
[facility] to [phone] 

## 2013-09-08 ENCOUNTER — Ambulatory Visit: Payer: BC Managed Care – PPO

## 2013-09-09 NOTE — Telephone Encounter (Signed)
Patient said that only 2 months of Vivelle Dot was called in; she needs 3 months (90 day) in order to get discount.

## 2013-09-10 NOTE — Telephone Encounter (Signed)
Pt has an Annual Exam scheduled for 09/2013 a Rx was sent to [facility] for Vivelle Dot #8 x 1 refill Pt says her Rx for Vivelle Dot cost has increased, it is much cheaper if she gets a 90 day supply. So Rx was called in for a 90 day supply.

## 2013-09-15 ENCOUNTER — Other Ambulatory Visit: Payer: BC Managed Care – PPO | Admitting: Lab

## 2013-09-16 ENCOUNTER — Ambulatory Visit: Payer: BC Managed Care – PPO | Admitting: Oncology

## 2013-09-16 ENCOUNTER — Other Ambulatory Visit: Payer: BC Managed Care – PPO | Admitting: Lab

## 2013-09-22 ENCOUNTER — Ambulatory Visit: Payer: BC Managed Care – PPO

## 2013-09-22 ENCOUNTER — Other Ambulatory Visit: Payer: Self-pay

## 2013-09-22 ENCOUNTER — Telehealth: Payer: Self-pay | Admitting: Family Medicine

## 2013-09-22 DIAGNOSIS — J069 Acute upper respiratory infection, unspecified: Secondary | ICD-10-CM

## 2013-09-23 MED ORDER — AMOXICILLIN 500 MG PO CAPS: 500.0000 mg | ORAL_CAPSULE | Freq: Three times a day (TID) | ORAL | Status: DC

## 2013-09-23 NOTE — Telephone Encounter (Signed)
Patient called in complaining of ear pain and yellow drainage and wanted an antibiotic called into the local drugstore She was called and notified that [facility] [place]

## 2013-09-24 ENCOUNTER — Telehealth: Payer: Self-pay | Admitting: Oncology

## 2013-09-24 NOTE — Telephone Encounter (Signed)
Pt called and change appt to February 2015 per pt rqst  due to her colonoscopy

## 2013-10-01 ENCOUNTER — Ambulatory Visit: Payer: Self-pay | Admitting: Gynecology

## 2013-10-01 ENCOUNTER — Encounter: Payer: Self-pay | Admitting: Family Medicine

## 2013-10-02 ENCOUNTER — Encounter: Payer: Self-pay | Admitting: Internal Medicine

## 2013-10-02 ENCOUNTER — Ambulatory Visit (AMBULATORY_SURGERY_CENTER): Payer: Self-pay

## 2013-10-02 VITALS — Ht 65.0 in | Wt 165.0 lb

## 2013-10-02 DIAGNOSIS — Z860100 Personal history of colon polyps, unspecified: Secondary | ICD-10-CM

## 2013-10-02 MED ORDER — MOVIPREP 100 G PO SOLR: 1.0000 | Freq: Once | ORAL | Status: DC

## 2013-10-06 ENCOUNTER — Encounter: Payer: BC Managed Care – PPO | Admitting: Gastroenterology

## 2013-10-08 ENCOUNTER — Ambulatory Visit
Admission: RE | Admit: 2013-10-08 | Discharge: 2013-10-08 | Disposition: A | Discharge status: Home / Self Care | Payer: BC Managed Care – PPO | Source: Ambulatory Visit

## 2013-10-08 DIAGNOSIS — Z1231 Encounter for screening mammogram for malignant neoplasm of breast: Secondary | ICD-10-CM

## 2013-10-09 NOTE — Telephone Encounter (Signed)
Please review/sign/close encounter.

## 2013-10-13 ENCOUNTER — Encounter: Payer: Self-pay | Admitting: Gastroenterology

## 2013-10-13 ENCOUNTER — Ambulatory Visit (AMBULATORY_SURGERY_CENTER): Payer: BC Managed Care – PPO | Admitting: Gastroenterology

## 2013-10-13 VITALS — BP 127/64 | HR 57 | Temp 98.0°F | Resp 18 | Ht 65.0 in | Wt 165.0 lb

## 2013-10-13 DIAGNOSIS — Z800 Family history of malignant neoplasm of digestive organs: Secondary | ICD-10-CM

## 2013-10-13 DIAGNOSIS — Z860100 Personal history of colon polyps, unspecified: Secondary | ICD-10-CM

## 2013-10-13 DIAGNOSIS — Z1211 Encounter for screening for malignant neoplasm of colon: Secondary | ICD-10-CM

## 2013-10-13 HISTORY — PX: COLONOSCOPY: SHX174

## 2013-10-13 MED ORDER — SODIUM CHLORIDE 0.9 % IV SOLN: 500.0000 mL | INTRAVENOUS | Status: DC

## 2013-10-13 NOTE — Patient Instructions (Signed)
YOU HAD AN ENDOSCOPIC PROCEDURE TODAY AT THE [facility]: Refer to the procedure report that was given to you for any specific questions about what was found during the examination.  If the procedure report does not answer your questions, please call your gastroenterologist to clarify.  If you requested that your care partner not be given the details of your procedure findings, then the procedure report has been included in a sealed envelope for you to review at your convenience later.  YOU SHOULD EXPECT: Some feelings of bloating in the abdomen. Passage of more gas than usual.  Walking can help get rid of the air that was put into your GI tract during the procedure and reduce the bloating. If you had a lower endoscopy (such as a colonoscopy or flexible sigmoidoscopy) you may notice spotting of blood in your stool or on the toilet paper. If you underwent a bowel prep for your procedure, then you may not have a normal bowel movement for a few days.  DIET: Your first meal following the procedure should be a light meal and then it is ok to progress to your normal diet.  A half-sandwich or bowl of soup is an example of a good first meal.  Heavy or fried foods are harder to digest and may make you feel nauseous or bloated.  Likewise meals heavy in dairy and vegetables can cause extra gas to form and this can also increase the bloating.  Drink plenty of fluids but you should avoid alcoholic beverages for 24 hours.  ACTIVITY: Your care partner should take you home directly after the procedure.  You should plan to take it easy, moving slowly for the rest of the day.  You can resume normal activity the day after the procedure however you should NOT DRIVE or use heavy machinery for 24 hours (because of the sedation medicines used during the test).    SYMPTOMS TO REPORT IMMEDIATELY: A gastroenterologist can be reached at any hour.  During normal business hours, 8:30 AM to 5:00 PM Monday through Friday,  call [phone].  After hours and on weekends, please call the GI answering service at [phone] who will take a message and have the physician on call contact you.   Following lower endoscopy (colonoscopy or flexible sigmoidoscopy):  Excessive amounts of blood in the stool  Significant tenderness or worsening of abdominal pains  Swelling of the abdomen that is new, acute  Fever of 100F or higher  FOLLOW UP: If any biopsies were taken you will be contacted by phone or by letter within the next 1-3 weeks.  Call your gastroenterologist if you have not heard about the biopsies in 3 weeks.  Our staff will call the home number listed on your records the next business day following your procedure to check on you and address any questions or concerns that you may have at that time regarding the information given to you following your procedure. This is a courtesy call and so if there is no answer at the home number and we have not heard from you through the emergency physician on call, we will assume that you have returned to your regular daily activities without incident.  SIGNATURES/CONFIDENTIALITY: You and/or your care partner have signed paperwork which will be entered into your electronic medical record.  These signatures attest to the fact that that the information above on your After Visit Summary has been reviewed and is understood.  Full responsibility of the confidentiality of this   discharge information lies with you and/or your care-partner.  Normal exam.  Repeat colonoscopy in 5 years.  

## 2013-10-13 NOTE — Op Note (Signed)
[place]  [address] [place], [id number]   COLONOSCOPY PROCEDURE REPORT  PATIENT: Krista, Henderson.  MR#: [id number] BIRTHDATE: March 02, 1951 , 62  yrs. old GENDER: Female ENDOSCOPIST: Sable Feil, MD, [facility] REFERRED XB:JYNWGN Laurance Flatten, M.D. PROCEDURE DATE:  10/13/2013 PROCEDURE:   Colonoscopy, surveillance First Screening Colonoscopy - Avg.  risk and is 50 yrs.  old or older - No.  Prior Negative Screening - Now for repeat screening. N/A  History of Adenoma - Now for follow-up colonoscopy & has been > or = to 3 yrs.  Yes hx of adenoma.  Has been 3 or more years since last colonoscopy. ASA CLASS:   Class II INDICATIONS:Patient's immediate family history of colon cancer. MEDICATIONS: propofol (Diprivan) 250mg  IV  DESCRIPTION OF PROCEDURE:   After the risks benefits and alternatives of the procedure were thoroughly explained, informed consent was obtained.  A digital rectal exam revealed no abnormalities of the rectum.   The     endoscope was introduced through the anus and advanced to the cecum, which was identified by both the appendix and ileocecal valve. No adverse events experienced.   The quality of the prep was excellent, using MoviPrep  The instrument was then slowly withdrawn as the colon was fully examined.      COLON FINDINGS: A normal appearing cecum, ileocecal valve, and appendiceal orifice were identified.  The ascending, hepatic flexure, transverse, splenic flexure, descending, sigmoid colon and rectum appeared unremarkable.  No polyps or cancers were seen. Retroflexed views revealed no abnormalities. The time to cecum=3 minutes 57 seconds.  Withdrawal time=7 minutes 56 seconds.  The scope was withdrawn and the procedure completed. COMPLICATIONS: There were no complications.  ENDOSCOPIC IMPRESSION: Normal colon....Marland Kitchenno polyps noted....  RECOMMENDATIONS: 1.  Repeat Colonoscopy in 5 years. 2.  Continue current medications   eSigned:   Sable Feil, MD, [facility] 10/13/2013 8:42 AM   cc:

## 2013-10-13 NOTE — Progress Notes (Signed)
Report to pacu rn, vss, bbs=clear 

## 2013-10-14 ENCOUNTER — Telehealth: Payer: Self-pay

## 2013-10-14 ENCOUNTER — Other Ambulatory Visit: Payer: BC Managed Care – PPO

## 2013-10-14 ENCOUNTER — Ambulatory Visit: Payer: BC Managed Care – PPO | Admitting: Oncology

## 2013-10-14 NOTE — Telephone Encounter (Signed)
  Follow up Call-  Call back number 10/13/2013  Post procedure Call Back phone  # [phone] hm  Permission to leave phone message Yes     Patient questions:  Do you have a fever, pain , or abdominal swelling? no Pain Score  0 *  Have you tolerated food without any problems? yes  Have you been able to return to your normal activities? yes  Do you have any questions about your discharge instructions: Diet   no Medications  no Follow up visit  no  Do you have questions or concerns about your Care? no  Actions: * If pain score is 4 or above: No action needed, pain <4.

## 2013-10-15 ENCOUNTER — Encounter: Payer: Self-pay | Admitting: Obstetrics and Gynecology

## 2013-10-16 ENCOUNTER — Telehealth: Payer: Self-pay | Admitting: *Deleted

## 2013-10-16 NOTE — Telephone Encounter (Signed)
Pt did not come to pick up [place] samples left for her at front desk on 05/09/13. Samples discarded per protocol.

## 2013-10-20 ENCOUNTER — Ambulatory Visit: Payer: Self-pay | Admitting: Gynecology

## 2013-10-28 ENCOUNTER — Ambulatory Visit: Payer: BC Managed Care – PPO | Admitting: Oncology

## 2013-10-28 ENCOUNTER — Other Ambulatory Visit: Payer: BC Managed Care – PPO

## 2013-11-10 ENCOUNTER — Ambulatory Visit: Payer: Self-pay | Admitting: Gynecology

## 2013-11-24 ENCOUNTER — Encounter: Payer: Self-pay | Admitting: Gynecology

## 2013-11-24 ENCOUNTER — Ambulatory Visit (INDEPENDENT_AMBULATORY_CARE_PROVIDER_SITE_OTHER): Payer: BC Managed Care – PPO | Admitting: Gynecology

## 2013-11-24 VITALS — BP 148/93 | HR 83 | Resp 20 | Ht 64.0 in | Wt 170.0 lb

## 2013-11-24 DIAGNOSIS — Z01419 Encounter for gynecological examination (general) (routine) without abnormal findings: Secondary | ICD-10-CM

## 2013-11-24 DIAGNOSIS — E049 Nontoxic goiter, unspecified: Secondary | ICD-10-CM

## 2013-11-24 DIAGNOSIS — Z0000 Encounter for general adult medical examination without abnormal findings: Secondary | ICD-10-CM

## 2013-11-24 DIAGNOSIS — N951 Menopausal and female climacteric states: Secondary | ICD-10-CM

## 2013-11-24 DIAGNOSIS — D72819 Decreased white blood cell count, unspecified: Secondary | ICD-10-CM

## 2013-11-24 LAB — POCT URINALYSIS DIPSTICK
Leukocytes, UA: NEGATIVE
Urobilinogen, UA: NEGATIVE
pH, UA: 5

## 2013-11-24 MED ORDER — ESTRADIOL 0.0375 MG/24HR TD PTTW: 1.0000 | MEDICATED_PATCH | TRANSDERMAL | Status: DC

## 2013-11-24 NOTE — Patient Instructions (Signed)

## 2013-11-24 NOTE — Addendum Note (Signed)
Addended by: Alfonzo Feller on: 11/24/2013 05:18 PM   Modules accepted: Orders

## 2013-11-24 NOTE — Progress Notes (Signed)
63 y.o. Married Caucasian female   G2P2002 here for annual exam. Pt reports menses are not regular.  She does not report hot flashes, does not have night sweats, does have vaginal dryness.  She is not using lubricants.  She does not report post-menopasual bleeding.  Pt happy with vivelle and would like to continue..pt reports 9# weight gain since May.  No LMP recorded. Patient has had a hysterectomy.          Sexually active: yes  The current method of family planning is status post hysterectomy.    Exercising: yes  walking, pickle ball 5x/2x wk Last pap: 2002? Hysterectomy Abnormal PAP: yes before hysterectomy  Mammogram: 10/09/13 Bi-rads 1 BSE: yes  Colonoscopy: 09/2013 Normal f/u in 5 years  DEXA: 2013 Alcohol: no Tobacco: no  Labs: Berkley Harvey ; Urine: Negative  Health Maintenance  Topic Date Due  . Pap Smear  10/20/2003  . Influenza Vaccine  09/03/2015 (Originally 04/18/2013)  . Mammogram  10/09/2015  . Colonoscopy  10/13/2018  . Tetanus/tdap  05/19/2021  . Zostavax  Completed    Family History  Problem Relation Age of Onset  . Diabetes Mother   . Osteoporosis Mother   . Hypertension Mother   . Cancer Father     colon  . Colon cancer Father 74  . Stomach cancer Paternal Grandfather   . Esophageal cancer Neg Hx   . Rectal cancer Neg Hx   . Breast cancer Paternal Aunt     80's    Patient Active Problem List   Diagnosis Date Noted  . Vitamin D deficiency 09/02/2013  . Hyperlipidemia 09/02/2013  . 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    Past Medical History  Diagnosis Date  . 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    WBC 3,000 08/30/11 52 P 43 L  . Vitamin D deficiency   . Hormone replacement therapy   . Hyperlipidemia   . Endometriosis   . History of migraine     Past Surgical History  Procedure Laterality Date  . Abdominal laporoscopic surgery    . Cesarean section      x2 '84 & '85  . Abdominal hysterectomy  1990    TAH/BSO-Endometriosis  . Appendectomy  1965    7th grade in  the '60's    Allergies: Codeine; Influenza vaccines; and Tylenol  Current Outpatient Prescriptions  Medication Sig Dispense Refill  . Calcium Carbonate-Vitamin D 600-400 MG-UNIT per tablet Take 1 tablet by mouth daily. With magnesium      . estradiol (VIVELLE-DOT) 0.0375 MG/24HR Place 1 patch onto the skin 2 (two) times a week.  8 patch  1  . Vitamin D, Ergocalciferol, (DRISDOL) 50000 UNITS CAPS capsule TAKE 1 CAPSULE BY MOUTH EVERY WEEK  12 capsule  0   No current facility-administered medications for this visit.    ROS: Pertinent items are noted in HPI.  Exam:    BP 148/93  Pulse 83  Resp 20  Ht 5\' 4"  (1.626 m)  Wt 170 lb (77.111 kg)  BMI 29.17 kg/m2 Weight change: @WEIGHTCHANGE @ Last 3 height recordings:  Ht Readings from Last 3 Encounters:  11/24/13 5\' 4"  (1.626 m)  10/13/13 5\' 5"  (1.651 m)  10/02/13 5\' 5"  (1.651 m)   General appearance: alert, cooperative and appears stated age Head: Normocephalic, without obvious abnormality, atraumatic Neck: no adenopathy, no carotid bruit, no JVD, supple, symmetrical, trachea midline and thyroid enlarged, symmetric, no tenderness/mass/nodules Lungs: clear to auscultation bilaterally Breasts: normal appearance, no masses or tenderness Heart: regular rate and  rhythm, S1, S2 normal, no murmur, click, rub or gallop Abdomen: soft, non-tender; bowel sounds normal; no masses,  no organomegaly Extremities: extremities normal, atraumatic, no cyanosis or edema Skin: Skin color, texture, turgor normal. No rashes or lesions Lymph nodes: Cervical, supraclavicular, and axillary nodes normal. no inguinal nodes palpated Neurologic: Grossly normal   Pelvic: External genitalia:  no lesions              Urethra: normal appearing urethra with no masses, tenderness or lesions              Bartholins and Skenes: normal                 Vagina: normal appearing vagina with normal color and discharge, no lesions              Cervix: absent               Pap taken: no        Bimanual Exam:  Uterus:  absent                                      Adnexa:    no masses                                      Rectovaginal: Confirms                                      Anus:  normal sphincter tone, no lesions  A: well woman Menopausal symptoms Vaginal dryness Enlarged thyroid     P: mammogram counseled on breast self exam, mammography screening, adequate intake of calcium and vitamin D, diet and exercise Pt would like to continue vivelle dot recommend cocoanut oil   TSH return annually or prn Discussed PAP guideline changes, importance of weight bearing exercises, calcium, vit D and balanced diet.  An After Visit Summary was printed and given to the patient.

## 2013-11-25 LAB — TSH: TSH: 2.956 u[IU]/mL (ref 0.350–4.500)

## 2014-01-06 ENCOUNTER — Ambulatory Visit: Payer: BC Managed Care – PPO | Admitting: Family Medicine

## 2014-01-27 ENCOUNTER — Other Ambulatory Visit: Payer: Self-pay | Admitting: Family Medicine

## 2014-01-27 ENCOUNTER — Telehealth: Payer: Self-pay | Admitting: Family Medicine

## 2014-01-27 MED ORDER — VITAMIN D (ERGOCALCIFEROL) 1.25 MG (50000 UNIT) PO CAPS: ORAL_CAPSULE | ORAL | Status: DC

## 2014-02-17 ENCOUNTER — Other Ambulatory Visit: Payer: Self-pay | Admitting: Gynecology

## 2014-02-19 ENCOUNTER — Telehealth: Payer: Self-pay

## 2014-02-19 DIAGNOSIS — D72819 Decreased white blood cell count, unspecified: Secondary | ICD-10-CM

## 2014-02-19 MED ORDER — ESTRADIOL 0.0375 MG/24HR TD PTTW: 1.0000 | MEDICATED_PATCH | TRANSDERMAL | Status: DC

## 2014-02-19 NOTE — Telephone Encounter (Addendum)
[facility] pharmacy calling stating that patient would like to pick up three month supply of vivelle dot at a time and prescription was not written that way. Requesting rx be changed to 3 month supply with refills. Rx for vivelle dot 0.0375 changed to #24 with 3RF and sent over to [facility]. Pharmacy agreeable.  Routing to provider for final review. Patient agreeable to disposition. Will close encounter

## 2014-03-27 DIAGNOSIS — Z0289 Encounter for other administrative examinations: Secondary | ICD-10-CM

## 2014-05-04 ENCOUNTER — Other Ambulatory Visit: Payer: Self-pay | Admitting: Family Medicine

## 2014-05-06 NOTE — Telephone Encounter (Signed)
Last vit D was 23.1 on 08/2013

## 2014-05-11 ENCOUNTER — Ambulatory Visit (INDEPENDENT_AMBULATORY_CARE_PROVIDER_SITE_OTHER): Payer: BC Managed Care – PPO | Admitting: Family Medicine

## 2014-05-11 ENCOUNTER — Encounter: Payer: Self-pay | Admitting: Family Medicine

## 2014-05-11 VITALS — BP 148/87 | HR 80 | Temp 98.9°F | Ht 64.0 in | Wt 162.0 lb

## 2014-05-11 DIAGNOSIS — H6090 Unspecified otitis externa, unspecified ear: Secondary | ICD-10-CM | POA: Insufficient documentation

## 2014-05-11 DIAGNOSIS — H60399 Other infective otitis externa, unspecified ear: Secondary | ICD-10-CM

## 2014-05-11 DIAGNOSIS — B029 Zoster without complications: Secondary | ICD-10-CM

## 2014-05-11 DIAGNOSIS — H6091 Unspecified otitis externa, right ear: Secondary | ICD-10-CM

## 2014-05-11 MED ORDER — VALACYCLOVIR HCL 1 G PO TABS: 1000.0000 mg | ORAL_TABLET | Freq: Three times a day (TID) | ORAL | Status: DC

## 2014-05-11 MED ORDER — ANTIPYRINE-BENZOCAINE 5.4-1.4 % OT SOLN: 3.0000 [drp] | OTIC | Status: DC | PRN

## 2014-05-11 NOTE — Progress Notes (Signed)
Krista Henderson is a 63 y.o. female who presents to [facility] today for back pain and ear pain   R lower back pain 4 days ago. Developed a hot feeling and a rash on Sunday. Spreading. Painful. H/o shingles in 2008. Shingles shot sometime in the past. Denies CP, SOB, fevers. Expired Lidoderm patches w/ some benefit. Movement makes the pain worse.   R ear pain: started a few days ago. Unchanged. Qtips w/o relief. No discharge. Deneis fevers, vertigo, hearing loss. H/o R TM rupture  The following portions of the patient's history were reviewed and updated as appropriate: allergies, current medications, past medical history, family and social history, and problem list.  Patient is a nonsmoker.    Past Medical History  Diagnosis Date  . 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    WBC 3,000 08/30/11 52 P 43 L  . Vitamin D deficiency   . Hormone replacement therapy   . Hyperlipidemia   . Endometriosis   . Migraine without aura     ROS as above otherwise neg.    Medications reviewed. Current Outpatient Prescriptions  Medication Sig Dispense Refill  . estradiol (VIVELLE-DOT) 0.0375 MG/24HR Place 1 patch onto the skin 2 (two) times a week.  24 patch  3  . Calcium Carbonate-Vitamin D 600-400 MG-UNIT per tablet Take 1 tablet by mouth daily. With magnesium      . Vitamin D, Ergocalciferol, (DRISDOL) 50000 UNITS CAPS capsule TAKE 1 CAPSULE BY MOUTH EVERY WEEK  12 capsule  0   No current facility-administered medications for this visit.    Exam:  BP 148/87  Pulse 80  Temp(Src) 98.9 F (37.2 C) (Oral)  Ht 5\' 4"  (1.626 m)  Wt 162 lb (73.483 kg)  BMI 27.79 kg/m2 Gen: Well NAD HEENT: EOMI,  MMM, L TM and external canal nml, R TM injected w/o retraction or protrusion. No effusion. External canal w/ mild erythema but no skin maceration or edema.  Lungs: CTABL Nl WOB Heart: RRR no MRG Abd: NABS, NT, ND Exts: warm and well perfused.  SKin: lumbar spin w/ Ecchymosis on L and R side that is warm to the touch.R>L. Mild  swelling. And ttp. No bony pain  No results found for this or any previous visit (from the past 72 hour(s)).  A/P (as seen in Problem list)  Shingles Not a typical presentation of shingles, but common for pt who develops more classic symptoms if allowed to progress NSAIDs Valtrex Precautions given and all questions answered   Otitis externa R non-infectious otitis externa.  Auralgan Routine cleaning instructions given w/ H2O2 and water. No Qtips

## 2014-05-11 NOTE — Assessment & Plan Note (Signed)
Not a typical presentation of shingles, but common for pt who develops more classic symptoms if allowed to progress NSAIDs Valtrex Precautions given and all questions answered

## 2014-05-11 NOTE — Patient Instructions (Signed)
You are doing well overall Please start the Valtrex for the shingles infection Please use an NSAID for pain relief as needed Your ear is not infected, just irritated Please start the auralgan for the ear pain.   Shingles Shingles (herpes zoster) is an infection that is caused by the same virus that causes chickenpox (varicella). The infection causes a painful skin rash and fluid-filled blisters, which eventually break open, crust over, and heal. It may occur in any area of the body, but it usually affects only one side of the body or face. The pain of shingles usually lasts about 1 month. However, some people with shingles may develop long-term (chronic) pain in the affected area of the body. Shingles often occurs many years after the person had chickenpox. It is more common:  In people older than 50 years.  In people with weakened immune systems, such as those with HIV, AIDS, or cancer.  In people taking medicines that weaken the immune system, such as transplant medicines.  In people under great stress. CAUSES  Shingles is caused by the varicella zoster virus (VZV), which also causes chickenpox. After a person is infected with the virus, it can remain in the person's body for years in an inactive state (dormant). To cause shingles, the virus reactivates and breaks out as an infection in a nerve root. The virus can be spread from person to person (contagious) through contact with open blisters of the shingles rash. It will only spread to people who have not had chickenpox. When these people are exposed to the virus, they may develop chickenpox. They will not develop shingles. Once the blisters scab over, the person is no longer contagious and cannot spread the virus to others. SIGNS AND SYMPTOMS  Shingles shows up in stages. The initial symptoms may be pain, itching, and tingling in an area of the skin. This pain is usually described as burning, stabbing, or throbbing.In a few days or weeks, a  painful red rash will appear in the area where the pain, itching, and tingling were felt. The rash is usually on one side of the body in a band or belt-like pattern. Then, the rash usually turns into fluid-filled blisters. They will scab over and dry up in approximately 2-3 weeks. Flu-like symptoms may also occur with the initial symptoms, the rash, or the blisters. These may include:  Fever.  Chills.  Headache.  Upset stomach. DIAGNOSIS  Your health care provider will perform a skin exam to diagnose shingles. Skin scrapings or fluid samples may also be taken from the blisters. This sample will be examined under a microscope or sent to a lab for further testing. TREATMENT  There is no specific cure for shingles. Your health care provider will likely prescribe medicines to help you manage the pain, recover faster, and avoid long-term problems. This may include antiviral drugs, anti-inflammatory drugs, and pain medicines. HOME CARE INSTRUCTIONS   Take a cool bath or apply cool compresses to the area of the rash or blisters as directed. This may help with the pain and itching.   Take medicines only as directed by your health care provider.   Rest as directed by your health care provider.  Keep your rash and blisters clean with mild soap and cool water or as directed by your health care provider.  Do not pick your blisters or scratch your rash. Apply an anti-itch cream or numbing creams to the affected area as directed by your health care provider.  Keep your  shingles rash covered with a loose bandage (dressing).  Avoid skin contact with:  Babies.   Pregnant women.   Children with eczema.   Elderly people with transplants.   People with chronic illnesses, such as leukemia or AIDS.   Wear loose-fitting clothing to help ease the pain of material rubbing against the rash.  Keep all follow-up visits as directed by your health care provider.If the area involved is on your  face, you may receive a referral for a specialist, such as an eye doctor (ophthalmologist) or an ear, nose, and throat (ENT) doctor. Keeping all follow-up visits will help you avoid eye problems, chronic pain, or disability.  SEEK IMMEDIATE MEDICAL CARE IF:   You have facial pain, pain around the eye area, or loss of feeling on one side of your face.  You have ear pain or ringing in your ear.  You have loss of taste.  Your pain is not relieved with prescribed medicines.   Your redness or swelling spreads.   You have more pain and swelling.  Your condition is worsening or has changed.   You have a fever. MAKE SURE YOU:  Understand these instructions.  Will watch your condition.  Will get help right away if you are not doing well or get worse. Document Released: 09/04/2005 Document Revised: 01/19/2014 Document Reviewed: 04/18/2012 [facility] Patient Information 2015 [place], [place]. This information is not intended to replace advice given to you by your health care provider. Make sure you discuss any questions you have with your health care provider.

## 2014-05-11 NOTE — Assessment & Plan Note (Signed)
R non-infectious otitis externa.  Auralgan Routine cleaning instructions given w/ H2O2 and water. No Qtips

## 2014-06-24 ENCOUNTER — Other Ambulatory Visit: Payer: BC Managed Care – PPO

## 2014-06-30 ENCOUNTER — Encounter: Payer: BC Managed Care – PPO | Admitting: Family Medicine

## 2014-07-02 ENCOUNTER — Encounter: Payer: BC Managed Care – PPO | Admitting: Family Medicine

## 2014-07-07 ENCOUNTER — Other Ambulatory Visit (INDEPENDENT_AMBULATORY_CARE_PROVIDER_SITE_OTHER): Payer: BC Managed Care – PPO

## 2014-07-07 DIAGNOSIS — E559 Vitamin D deficiency, unspecified: Secondary | ICD-10-CM

## 2014-07-07 DIAGNOSIS — E785 Hyperlipidemia, unspecified: Secondary | ICD-10-CM

## 2014-07-07 DIAGNOSIS — I10 Essential (primary) hypertension: Secondary | ICD-10-CM

## 2014-07-07 LAB — POCT CBC
Granulocyte percent: 62.6 %G (ref 37–80)
HCT, POC: 38.9 % (ref 37.7–47.9)
Hemoglobin: 12.6 g/dL (ref 12.2–16.2)
Lymph, poc: 1.1 (ref 0.6–3.4)
MCH, POC: 31.4 pg — AB (ref 27–31.2)
MCHC: 32.5 g/dL (ref 31.8–35.4)
MCV: 96.6 fL (ref 80–97)
MPV: 8.8 fL (ref 0–99.8)
POC Granulocyte: 2.3 (ref 2–6.9)
POC LYMPH PERCENT: 30.8 %L (ref 10–50)
Platelet Count, POC: 156 10*3/uL (ref 142–424)
RBC: 4 M/uL — AB (ref 4.04–5.48)
RDW, POC: 12.1 %
WBC: 3.7 10*3/uL — AB (ref 4.6–10.2)

## 2014-07-08 ENCOUNTER — Telehealth: Payer: Self-pay | Admitting: *Deleted

## 2014-07-08 LAB — BMP8+EGFR
BUN/Creatinine Ratio: 21 (ref 11–26)
BUN: 18 mg/dL (ref 8–27)
CO2: 21 mmol/L (ref 18–29)
Calcium: 9.4 mg/dL (ref 8.7–10.3)
Chloride: 102 mmol/L (ref 97–108)
Creatinine, Ser: 0.84 mg/dL (ref 0.57–1.00)
GFR calc Af Amer: 86 mL/min/{1.73_m2} (ref >59)
GFR calc non Af Amer: 74 mL/min/{1.73_m2} (ref >59)
Glucose: 114 mg/dL — ABNORMAL HIGH (ref 65–99)
Potassium: 4.2 mmol/L (ref 3.5–5.2)
Sodium: 140 mmol/L (ref 134–144)

## 2014-07-08 LAB — LIPID PANEL
Chol/HDL Ratio: 3.4 ratio units (ref 0.0–4.4)
Cholesterol, Total: 230 mg/dL — ABNORMAL HIGH (ref 100–199)
HDL: 68 mg/dL (ref >39)
LDL Calculated: 141 mg/dL — ABNORMAL HIGH (ref 0–99)
Triglycerides: 104 mg/dL (ref 0–149)
VLDL Cholesterol Cal: 21 mg/dL (ref 5–40)

## 2014-07-08 LAB — HEPATIC FUNCTION PANEL
ALT: 12 IU/L (ref 0–32)
AST: 14 IU/L (ref 0–40)
Albumin: 4.6 g/dL (ref 3.6–4.8)
Alkaline Phosphatase: 73 IU/L (ref 39–117)
Bilirubin, Direct: 0.16 mg/dL (ref 0.00–0.40)
Total Bilirubin: 0.8 mg/dL (ref 0.0–1.2)
Total Protein: 6.7 g/dL (ref 6.0–8.5)

## 2014-07-08 LAB — VITAMIN D 25 HYDROXY (VIT D DEFICIENCY, FRACTURES): Vit D, 25-Hydroxy: 32.2 ng/mL (ref 30.0–100.0)

## 2014-07-08 NOTE — Telephone Encounter (Signed)
Please call for lab results.

## 2014-07-15 ENCOUNTER — Encounter: Payer: Self-pay | Admitting: Family Medicine

## 2014-07-15 ENCOUNTER — Ambulatory Visit (INDEPENDENT_AMBULATORY_CARE_PROVIDER_SITE_OTHER): Payer: BC Managed Care – PPO | Admitting: Family Medicine

## 2014-07-15 VITALS — BP 143/87 | HR 81 | Temp 98.7°F | Ht 64.0 in | Wt 164.0 lb

## 2014-07-15 DIAGNOSIS — D72819 Decreased white blood cell count, unspecified: Secondary | ICD-10-CM

## 2014-07-15 DIAGNOSIS — E785 Hyperlipidemia, unspecified: Secondary | ICD-10-CM

## 2014-07-15 DIAGNOSIS — Z0000 Encounter for general adult medical examination without abnormal findings: Secondary | ICD-10-CM

## 2014-07-15 DIAGNOSIS — E559 Vitamin D deficiency, unspecified: Secondary | ICD-10-CM

## 2014-07-15 DIAGNOSIS — Z23 Encounter for immunization: Secondary | ICD-10-CM

## 2014-07-15 MED ORDER — VITAMIN D (ERGOCALCIFEROL) 1.25 MG (50000 UNIT) PO CAPS: ORAL_CAPSULE | ORAL | Status: DC

## 2014-07-15 NOTE — Patient Instructions (Addendum)
Continue current medications. Continue good therapeutic lifestyle changes which include good diet and exercise. Fall precautions discussed with patient. If an FOBT was given today- please return it to our front desk. If you are over 63 years old - you may need Prevnar 71 or the adult Pneumonia vaccine.  Flu Shots will be available at our office starting mid- September. Please call and schedule a FLU CLINIC APPOINTMENT.   Continue to take vitamin D 50,000 units weekly Try cholesterol medicine as directed 2 mg on Monday Wednesday and Friday. Samples were given to patient to try. Continue to try to exercise and drink plenty of water and lose weight

## 2014-07-15 NOTE — Progress Notes (Signed)
Subjective:    Patient ID: Krista Henderson, female    DOB: 1951/05/01, 63 y.o.   MRN: [id number]  HPI Pt here for follow up and management of chronic medical problems. The patient is here today for her annual medical exam. She has no specific complaints other than she wants an area on the top of her right foot checked more closely. Her recent blood work will be reviewed with her. The blood sugar was elevated at 114. The LDL C was elevated at 141. The the vitamin D remains at the low end of the normal range. She was reminded to continue to take the vitamin D 50,000 weekly. She continues to have a leukopenia with a slightly decreased white blood cell count and this has been consistent with her readings in the past. Her next mammogram will be due in January and it appears that she is due a Prevnar vaccine.         Patient Active Problem List   Diagnosis Date Noted  . Shingles 05/11/2014  . Otitis externa 05/11/2014  . Vitamin D deficiency 09/02/2013  . Hyperlipidemia 09/02/2013  . 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   Outpatient Encounter Prescriptions as of 07/15/2014  Medication Sig  . Calcium Carbonate-Vitamin D 600-400 MG-UNIT per tablet Take 1 tablet by mouth daily. With magnesium  . estradiol (VIVELLE-DOT) 0.0375 MG/24HR Place 1 patch onto the skin 2 (two) times a week.  . Vitamin D, Ergocalciferol, (DRISDOL) 50000 UNITS CAPS capsule TAKE 1 CAPSULE BY MOUTH EVERY WEEK  . [DISCONTINUED] antipyrine-benzocaine (AURALGAN) otic solution Place 3-4 drops into the right ear every 2 (two) hours as needed for ear pain.  . [DISCONTINUED] valACYclovir (VALTREX) 1000 MG tablet Take 1 tablet (1,000 mg total) by mouth 3 (three) times daily.    Review of Systems  Constitutional: Negative.   HENT: Negative.   Eyes: Negative.   Respiratory: Negative.   Cardiovascular: Negative.   Gastrointestinal: Negative.   Endocrine: Negative.   Genitourinary: Negative.   Musculoskeletal: Negative.   Skin: Negative.         Area on right top of foot  Allergic/Immunologic: Negative.   Neurological: Negative.   Hematological: Negative.   Psychiatric/Behavioral: Negative.        Objective:   Physical Exam  Nursing note and vitals reviewed. Constitutional: She is oriented to person, place, and time. She appears well-developed and well-nourished. No distress.  HENT:  Head: Normocephalic and atraumatic.  Right Ear: External ear normal.  Left Ear: External ear normal.  Nose: Nose normal.  Mouth/Throat: Oropharynx is clear and moist. No oropharyngeal exudate.  Eyes: Conjunctivae and EOM are normal. Pupils are equal, round, and reactive to light. Right eye exhibits no discharge. Left eye exhibits no discharge. No scleral icterus.  Neck: Normal range of motion. Neck supple. No thyromegaly present.  No carotid bruits  Cardiovascular: Normal rate, regular rhythm, normal heart sounds and intact distal pulses.  Exam reveals no gallop and no friction rub.   No murmur heard. At 72/m  Pulmonary/Chest: Effort normal and breath sounds normal. No respiratory distress. She has no wheezes. She has no rales. She exhibits no tenderness.  Lungs are clear anteriorly and posteriorly  Abdominal: Soft. Bowel sounds are normal. She exhibits no mass. There is no tenderness. There is no rebound and no guarding.  No abdominal bruits or masses  Musculoskeletal: Normal range of motion. She exhibits no edema and no tenderness.  Lymphadenopathy:    She has no cervical adenopathy.  Neurological: She is  alert and oriented to person, place, and time. She has normal reflexes.  Skin: Skin is warm and dry. No rash noted. No erythema. No pallor.  The area of skin on top of the right foot appears wider than the surrounding skin is most likely scar tissue and there is no abnormal lesion here.  Psychiatric: She has a normal mood and affect. Her behavior is normal. Judgment and thought content normal.   BP 143/87  Pulse 81  Temp(Src)  98.7 F (37.1 C) (Oral)  Ht 5\' 4"  (1.626 m)  Wt 164 lb (74.39 kg)  BMI 28.14 kg/m2        Assessment & Plan:  1. Vitamin D deficiency -Continue current treatment  2. Hyperlipidemia -Trial of Livalo 2mg , one on Monday Wednesday and Friday  3. Leukopenia -Continue to monitor this -Patient has seen hematologist and no further treatment was necessary  4. Annual physical exam  Patient Instructions  Continue current medications. Continue good therapeutic lifestyle changes which include good diet and exercise. Fall precautions discussed with patient. If an FOBT was given today- please return it to our front desk. If you are over 57 years old - you may need Prevnar 34 or the adult Pneumonia vaccine.  Flu Shots will be available at our office starting mid- September. Please call and schedule a FLU CLINIC APPOINTMENT.   Continue to take vitamin D 50,000 units weekly Try cholesterol medicine as directed 2 mg on Monday Wednesday and Friday. Samples were given to patient to try. Continue to try to exercise and drink plenty of water and lose weight   Arrie Senate MD

## 2014-07-20 ENCOUNTER — Encounter: Payer: Self-pay | Admitting: Family Medicine

## 2014-12-09 ENCOUNTER — Other Ambulatory Visit: Payer: Self-pay | Admitting: Nurse Practitioner

## 2014-12-09 ENCOUNTER — Other Ambulatory Visit: Payer: Self-pay

## 2014-12-09 DIAGNOSIS — D72819 Decreased white blood cell count, unspecified: Secondary | ICD-10-CM

## 2014-12-09 DIAGNOSIS — Z1231 Encounter for screening mammogram for malignant neoplasm of breast: Secondary | ICD-10-CM

## 2014-12-09 NOTE — Telephone Encounter (Addendum)
Medication refill request: estradiol Last AEX:  11/24/13 TL Next AEX: 12/17/14 PG Last MMG (if hormonal medication request): 10/08/13 BIRADS1:Neg Refill authorized: 02/19/14 #[id number]. [facility] states she has refills until 02/2015. Attempted to transfer Rx. [facility] has to put in request for pt.  Called pt and notified pt she needs to call [facility] to [facility]. Patient hesitant, states pharmacy told her she does not have any more refills. Assure pt I just spoke with pharmacist and she has refills until 02/2015.

## 2014-12-09 NOTE — Telephone Encounter (Addendum)
Patient scheduled her MMG. [facility] 12[facility]15[facility]14 at 3:40pm.  Patient needs refill this week.

## 2014-12-09 NOTE — Telephone Encounter (Signed)
Patient calling requesting a refill on Vivelle Dot. AEX 12/17/14.  [facility]/S [phone]

## 2014-12-10 ENCOUNTER — Telehealth: Payer: Self-pay | Admitting: Nurse Practitioner

## 2014-12-10 DIAGNOSIS — D72819 Decreased white blood cell count, unspecified: Secondary | ICD-10-CM

## 2014-12-10 MED ORDER — ESTRADIOL 0.0375 MG/24HR TD PTTW: 1.0000 | MEDICATED_PATCH | TRANSDERMAL | Status: DC

## 2014-12-10 NOTE — Telephone Encounter (Signed)
Pt says her Rx for Vivelle Dot 0.0375 should go to [facility] at [phone]. Does not want the generic.

## 2014-12-10 NOTE — Addendum Note (Signed)
Addended by: Alfonzo Feller on: 12/10/2014 11:54 AM   Modules accepted: Orders

## 2014-12-10 NOTE — Telephone Encounter (Signed)
Last aex: 11/24/2013 with Dr.Lathrop Next aex: 12/17/2014 with Milford Cage, FNP  Last MMG: 10/08/2013 Birads 1 negative Next MMG: 12/15/2014  One refill sent to [facility] for Vivelle Dot 0.0375mg  until she is seen for annual exam with Milford Cage, FNP. Advised patient rx has been sent. Patient is agreeable.  Routing to provider for final review. Patient agreeable to disposition. Will close encounter

## 2014-12-10 NOTE — Telephone Encounter (Signed)
Ms. Chong Sicilian please advise refill.

## 2014-12-10 NOTE — Telephone Encounter (Signed)
LM on patient's vm that rx has been sent to [facility].

## 2014-12-13 MED ORDER — ESTRADIOL 0.0375 MG/24HR TD PTTW: 1.0000 | MEDICATED_PATCH | TRANSDERMAL | Status: DC

## 2014-12-15 ENCOUNTER — Ambulatory Visit
Admission: RE | Admit: 2014-12-15 | Discharge: 2014-12-15 | Disposition: A | Discharge status: Home / Self Care | Payer: BLUE CROSS/BLUE SHIELD | Source: Ambulatory Visit

## 2014-12-15 DIAGNOSIS — Z1231 Encounter for screening mammogram for malignant neoplasm of breast: Secondary | ICD-10-CM

## 2014-12-17 ENCOUNTER — Ambulatory Visit (INDEPENDENT_AMBULATORY_CARE_PROVIDER_SITE_OTHER): Payer: BLUE CROSS/BLUE SHIELD | Admitting: Nurse Practitioner

## 2014-12-17 ENCOUNTER — Encounter: Payer: Self-pay | Admitting: Nurse Practitioner

## 2014-12-17 VITALS — BP 130/82 | HR 68 | Ht 64.25 in | Wt 162.0 lb

## 2014-12-17 DIAGNOSIS — Z01419 Encounter for gynecological examination (general) (routine) without abnormal findings: Secondary | ICD-10-CM

## 2014-12-17 DIAGNOSIS — D72819 Decreased white blood cell count, unspecified: Secondary | ICD-10-CM | POA: Diagnosis not present

## 2014-12-17 MED ORDER — ESTRADIOL 0.0375 MG/24HR TD PTTW: 1.0000 | MEDICATED_PATCH | TRANSDERMAL | Status: DC

## 2014-12-17 MED ORDER — ESTRADIOL 0.5 MG PO TABS: 0.5000 mg | ORAL_TABLET | Freq: Every day | ORAL | Status: DC

## 2014-12-17 NOTE — Progress Notes (Signed)
Patient ID: Krista Henderson, female   DOB: 08/10/1951, 64 y.o.   MRN: [id number] 64 y.o. G2P2002 Married  Caucasian Fe here for annual exam.  No new health problems.  She is on ERT since Hysterectomy at age 65.  She is now finding the cost to be very expensive.  She has tried the generic Vivelle dot and had problems with adhesion.  She is not fond of trying the oral tablet but has agreed to try for a month - prefers a written RX to compare cost.  She has been out of ERT for a week and having bad vaso symptoms.  Patient's last menstrual period was 08/18/1989.          Sexually active: Yes.    The current method of family planning is status post hysterectomy.    Exercising: Yes.    walking 4 miles 5 days per week Smoker:  no  Health Maintenance: Pap:  10/2000, history of abnormal prior to hysterectomy MMG:  12/15/14, Bi-Rads 1:  Negative Colonoscopy:  10/13/13, normal, repeat in 5 years due to family history BMD:   2012, normal per patient TDaP:  05/20/11 Labs:  07/07/14 in EPIC   reports that she has never smoked. She has never used smokeless tobacco. She reports that she does not drink alcohol or use illicit drugs.  Past Medical History  Diagnosis Date  . 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    WBC 3,000 08/30/11 52 P 43 L  . Vitamin D deficiency   . Hormone replacement therapy   . Hyperlipidemia   . Endometriosis   . Migraine without aura     Past Surgical History  Procedure Laterality Date  . Abdominal laporoscopic surgery    . Cesarean section      x2 '84 & '85  . Appendectomy  1965    7th grade in the '60's  . Total abdominal hysterectomy w/ bilateral salpingoophorectomy  08/1989    endometriosis    Current Outpatient Prescriptions  Medication Sig Dispense Refill  . Calcium Carbonate-Vitamin D 600-400 MG-UNIT per tablet Take 1 tablet by mouth daily. With magnesium    . Vitamin D, Ergocalciferol, (DRISDOL) 50000 UNITS CAPS capsule TAKE 1 CAPSULE BY MOUTH EVERY WEEK 12 capsule 3  . estradiol  (ESTRACE) 0.5 MG tablet Take 1 tablet (0.5 mg total) by mouth daily. 30 tablet 0  . estradiol (VIVELLE-DOT) 0.0375 MG/24HR Place 1 patch onto the skin 2 (two) times a week. 4 patch 12   No current facility-administered medications for this visit.    Family History  Problem Relation Age of Onset  . Diabetes Mother   . Osteoporosis Mother   . Hypertension Mother   . Colon cancer Father 67  . Stomach cancer Paternal Grandfather   . Esophageal cancer Neg Hx   . Rectal cancer Neg Hx   . Breast cancer Paternal Aunt     80's    ROS:  Pertinent items are noted in HPI.  Otherwise, a comprehensive ROS was negative.  Exam:   BP 130/82 mmHg  Pulse 68  Ht 5' 4.25" (1.632 m)  Wt 162 lb (73.483 kg)  BMI 27.59 kg/m2  LMP 08/18/1989 Height: 5' 4.25" (163.2 cm) Ht Readings from Last 3 Encounters:  12/17/14 5' 4.25" (1.632 m)  07/15/14 5\' 4"  (1.626 m)  05/11/14 5\' 4"  (1.626 m)    General appearance: alert, cooperative and appears stated age Head: Normocephalic, without obvious abnormality, atraumatic Neck: no adenopathy, supple, symmetrical, trachea midline and thyroid normal to  inspection and palpation Lungs: clear to auscultation bilaterally Breasts: normal appearance, no masses or tenderness Heart: regular rate and rhythm Abdomen: soft, non-tender; no masses,  no organomegaly Extremities: extremities normal, atraumatic, no cyanosis or edema Skin: Skin color, texture, turgor normal. No rashes or lesions Lymph nodes: Cervical, supraclavicular, and axillary nodes normal. No abnormal inguinal nodes palpated Neurologic: Grossly normal   Pelvic: External genitalia:  no lesions              Urethra:  normal appearing urethra with no masses, tenderness or lesions              Bartholin's and Skene's: normal                 Vagina: normal appearing vagina with normal color and discharge, no lesions              Cervix: absent              Pap taken: No. Bimanual Exam:  Uterus:  normal  size, contour, position, consistency, mobility, non-tender              Adnexa: no mass, fullness, tenderness               Rectovaginal: Confirms               Anus:  normal sphincter tone, no lesions  Chaperone present:  no  A:  Well Woman with normal exam  S/P TAH / BSO secondary to endometriosis 08/1989  Postmenopausal on ERT since 08/1989  Atrophic vaginitis  Hx. enlarged thyroid - followed by PCP    P:   Reviewed health and wellness pertinent to exam  Pap smear not taken today  Mammogram is due 11/2015  Refilled RX for Vivelle dot 0.375 mg for a year   RX given for Estradiol 0.5 mg # 30 to see if tolerated and cost comparison.  Counseled with risk of DVT, CVA, cancer, etc.  Counseled with mammogram, diet, calcium, exercise return annually or prn  An After Visit Summary was printed and given to the patient.

## 2014-12-17 NOTE — Patient Instructions (Addendum)

## 2014-12-17 NOTE — Progress Notes (Signed)
Encounter reviewed by Dr. Jassmine Vandruff Silva.  

## 2014-12-22 ENCOUNTER — Telehealth: Payer: Self-pay | Admitting: Nurse Practitioner

## 2014-12-22 NOTE — Telephone Encounter (Signed)
Spoke with patient. Patient states that she started taking Estradiol .5mg  tablets daily last Friday 4/1. "I am still having night sweats with this prescription. Is this because it is not in my system?" Advised patient can take 4-6 weeks for medication to get in system to really reduce symptoms. Patient states "I really just have not had luck with oral estrogen. I was not having any problems with the patch. I am just going to switch back over." Advised would let Krista Cage, FNP know. Patient is agreeable. Refills were sent for Vivelle dot at annual exam appointment as well.   Routing to provider for final review. Patient agreeable to disposition. Will close encounter

## 2014-12-22 NOTE — Telephone Encounter (Signed)
Patient changed prescriptions from Vivelle dot to Estradiol. Patient is having night sweats with Estradiol. Last seen 12/17/14.

## 2015-03-24 ENCOUNTER — Encounter (INDEPENDENT_AMBULATORY_CARE_PROVIDER_SITE_OTHER): Payer: Self-pay

## 2015-03-24 ENCOUNTER — Ambulatory Visit (HOSPITAL_COMMUNITY): Admission: RE | Admit: 2015-03-24 | Payer: BLUE CROSS/BLUE SHIELD | Source: Ambulatory Visit

## 2015-03-24 ENCOUNTER — Encounter: Payer: Self-pay | Admitting: Family Medicine

## 2015-03-24 ENCOUNTER — Ambulatory Visit (INDEPENDENT_AMBULATORY_CARE_PROVIDER_SITE_OTHER): Payer: BLUE CROSS/BLUE SHIELD | Admitting: Family Medicine

## 2015-03-24 VITALS — BP 150/88 | HR 84 | Temp 98.0°F | Ht 64.0 in | Wt 161.0 lb

## 2015-03-24 DIAGNOSIS — N390 Urinary tract infection, site not specified: Secondary | ICD-10-CM

## 2015-03-24 DIAGNOSIS — R8281 Pyuria: Secondary | ICD-10-CM

## 2015-03-24 DIAGNOSIS — M545 Low back pain: Secondary | ICD-10-CM

## 2015-03-24 DIAGNOSIS — R1032 Left lower quadrant pain: Secondary | ICD-10-CM | POA: Diagnosis not present

## 2015-03-24 DIAGNOSIS — R739 Hyperglycemia, unspecified: Secondary | ICD-10-CM

## 2015-03-24 DIAGNOSIS — R197 Diarrhea, unspecified: Secondary | ICD-10-CM

## 2015-03-24 DIAGNOSIS — R002 Palpitations: Secondary | ICD-10-CM

## 2015-03-24 DIAGNOSIS — B349 Viral infection, unspecified: Secondary | ICD-10-CM | POA: Diagnosis not present

## 2015-03-24 LAB — POCT CBC
Granulocyte percent: 73.2 %G (ref 37–80)
HCT, POC: 41.3 % (ref 37.7–47.9)
Hemoglobin: 13.4 g/dL (ref 12.2–16.2)
Lymph, poc: 1 (ref 0.6–3.4)
MCH, POC: 30.3 pg (ref 27–31.2)
MCHC: 32.4 g/dL (ref 31.8–35.4)
MCV: 93.5 fL (ref 80–97)
MPV: 8.1 fL (ref 0–99.8)
POC Granulocyte: 3.1 (ref 2–6.9)
POC LYMPH PERCENT: 24 %L (ref 10–50)
Platelet Count, POC: 195 10*3/uL (ref 142–424)
RBC: 4.41 M/uL (ref 4.04–5.48)
RDW, POC: 12.2 %
WBC: 4.2 10*3/uL — AB (ref 4.6–10.2)

## 2015-03-24 LAB — POCT UA - MICROSCOPIC ONLY
Bacteria, U Microscopic: NEGATIVE
Casts, Ur, LPF, POC: NEGATIVE
Crystals, Ur, HPF, POC: NEGATIVE
Mucus, UA: NEGATIVE
RBC, urine, microscopic: NEGATIVE
Yeast, UA: NEGATIVE

## 2015-03-24 LAB — POCT URINALYSIS DIPSTICK
Bilirubin, UA: NEGATIVE
Glucose, UA: NEGATIVE
Ketones, UA: NEGATIVE
Leukocytes, UA: NEGATIVE
Nitrite, UA: NEGATIVE
Spec Grav, UA: 1.01
Urobilinogen, UA: NEGATIVE
pH, UA: 7

## 2015-03-24 MED ORDER — CIPROFLOXACIN HCL 500 MG PO TABS: 500.0000 mg | ORAL_TABLET | Freq: Two times a day (BID) | ORAL | Status: DC

## 2015-03-24 NOTE — Progress Notes (Signed)
Subjective:    Patient ID: Krista Henderson, female    DOB: December 06, 1950, 64 y.o.   MRN: [id number]  HPI  Woke up last night at 2:30am with night sweats and SOB. Had chills and diarrhea. Called 911 and they came to home and performed an EKG which they stated was normal. The patient had been to the beach this weekend and did have some seafood and she had an episode early Sunday morning which would've been 3 days ago similar to the one that she had this morning. She has had 3-5 loose bowel movements since last night. She is hurting in her left lower quadrant also. She denies any voiding symptoms. She has had some shortness of breath with the palpitations. As mentioned she does drink a lot of caffeine. The patient's husband comes with her to the visit today.    Patient Active Problem List   Diagnosis Date Noted  . Shingles 05/11/2014  . Vitamin D deficiency 09/02/2013  . Hyperlipidemia 09/02/2013  . 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   Outpatient Encounter Prescriptions as of 03/24/2015  Medication Sig  . Calcium Carbonate-Vitamin D 600-400 MG-UNIT per tablet Take 1 tablet by mouth daily. With magnesium  . estradiol (VIVELLE-DOT) 0.0375 MG/24HR Place 1 patch onto the skin 2 (two) times a week.  . Vitamin D, Ergocalciferol, (DRISDOL) 50000 UNITS CAPS capsule TAKE 1 CAPSULE BY MOUTH EVERY WEEK  . [DISCONTINUED] estradiol (ESTRACE) 0.5 MG tablet Take 1 tablet (0.5 mg total) by mouth daily.   No facility-administered encounter medications on file as of 03/24/2015.       Review of Systems  Constitutional: Positive for chills. Negative for fever, diaphoresis, activity change, appetite change, fatigue and unexpected weight change.  HENT: Negative.   Eyes: Negative.   Respiratory: Positive for shortness of breath. Negative for apnea, cough, choking, chest tightness, wheezing and stridor.   Cardiovascular: Positive for palpitations. Negative for chest pain and leg swelling.  Gastrointestinal: Negative.     Endocrine: Negative.   Genitourinary: Negative.   Musculoskeletal: Positive for back pain. Negative for myalgias, joint swelling, arthralgias, gait problem, neck pain and neck stiffness.  Skin: Negative.   Allergic/Immunologic: Negative.   Neurological: Negative.   Hematological: Negative.   Psychiatric/Behavioral: Negative.        Objective:   Physical Exam  Constitutional: She is oriented to person, place, and time. She appears well-developed and well-nourished. She appears distressed.  HENT:  Head: Normocephalic and atraumatic.  Right Ear: External ear normal.  Left Ear: External ear normal.  Mouth/Throat: Oropharynx is clear and moist. No oropharyngeal exudate.  Some nasal congestion right greater than left  Eyes: Conjunctivae and EOM are normal. Pupils are equal, round, and reactive to light. Right eye exhibits no discharge. Left eye exhibits no discharge. No scleral icterus.  Neck: Normal range of motion. Neck supple. No JVD present. No thyromegaly present.  No anterior cervical nodes or thyromegaly  Cardiovascular: Normal rate, regular rhythm, normal heart sounds and intact distal pulses.   No murmur heard. At 84/m and regular  Pulmonary/Chest: Effort normal and breath sounds normal. No respiratory distress. She has no wheezes. She has no rales. She exhibits no tenderness.  Abdominal: Soft. Bowel sounds are normal. She exhibits no mass. There is tenderness. There is no rebound and no guarding.  The abdomen is tender in the. Umbilical area and down toward the left lower quadrant  Musculoskeletal: Normal range of motion. She exhibits no edema.  Lymphadenopathy:    She has no cervical  adenopathy.  Neurological: She is alert and oriented to person, place, and time.  Skin: Skin is warm and dry. No rash noted.  Psychiatric: She has a normal mood and affect. Her behavior is normal. Judgment and thought content normal.  Somewhat anxious  Nursing note and vitals reviewed.   BP  150/88 mmHg  Pulse 84  Temp(Src) 98 F (36.7 C) (Oral)  Ht 5' 4" (1.626 m)  Wt 161 lb (73.029 kg)  BMI 27.62 kg/m2  SpO2 98%  LMP 08/18/1989  EKG: Within normal limits Results for orders placed or performed in visit on 03/24/15  POCT CBC  Result Value Ref Range   WBC 4.2 (A) 4.6 - 10.2 K/uL   Lymph, poc 1.0 0.6 - 3.4   POC LYMPH PERCENT 24.0 10 - 50 %L   POC Granulocyte 3.1 2 - 6.9   Granulocyte percent 73.2 37 - 80 %G   RBC 4.41 4.04 - 5.48 M/uL   Hemoglobin 13.4 12.2 - 16.2 g/dL   HCT, POC 41.3 37.7 - 47.9 %   MCV 93.5 80 - 97 fL   MCH, POC 30.3 27 - 31.2 pg   MCHC 32.4 31.8 - 35.4 g/dL   RDW, POC 12.2 %   Platelet Count, POC 195 142 - 424 K/uL   MPV 8.1 0 - 99.8 fL  POCT urinalysis dipstick  Result Value Ref Range   Color, UA gold    Clarity, UA clear    Glucose, UA negative    Bilirubin, UA negative    Ketones, UA negative    Spec Grav, UA 1.010    Blood, UA trace    pH, UA 7.0    Protein, UA trace    Urobilinogen, UA negative    Nitrite, UA negative    Leukocytes, UA Negative Negative  POCT UA - Microscopic Only  Result Value Ref Range   WBC, Ur, HPF, POC 10-15    RBC, urine, microscopic negative    Bacteria, U Microscopic negative    Mucus, UA negative    Epithelial cells, urine per micros few    Crystals, Ur, HPF, POC negative    Casts, Ur, LPF, POC negative    Yeast, UA negative    The above lab results and EKG results were reviewed with the patient and her husband.     Assessment & Plan:  1. Low back pain without sciatica, unspecified back pain laterality - BMP8+EGFR - POCT urinalysis dipstick - POCT UA - Microscopic Only - Urine culture - Thyroid Panel With TSH - CT Abdomen Pelvis Wo Contrast; Future  2. Diarrhea -Follow diet as directed - POCT CBC - Thyroid Panel With TSH - CT Abdomen Pelvis Wo Contrast; Future  3. Palpitations -Decrease caffeine as directed - Thyroid Panel With TSH - EKG 12-Lead  4. Viral syndrome -Follow diet  as directed - Thyroid Panel With TSH  5. Pyuria -Take antibiotic, Cipro 500 twice daily for 7 days - Thyroid Panel With TSH - CT Abdomen Pelvis Wo Contrast; Future  6. Left lower quadrant pain -Follow diet as directed and get CT scan - Thyroid Panel With TSH - CT Abdomen Pelvis Wo Contrast; Future  Meds ordered this encounter  Medications  . ciprofloxacin (CIPRO) 500 MG tablet    Sig: Take 1 tablet (500 mg total) by mouth 2 (two) times daily.    Dispense:  14 tablet    Refill:  0   Patient Instructions  Decrease the use of caffeine as much as  possible Clear liquids for 24 hours (like 7-Up, ginger ale, Sprite, Jello, frozen pops) Full liquids the second 24-hours (like potato soup, tomato soup, chicken noodle soup) Bland diet the third 24-hours (boiled and baked foods, no fried or greasy foods) Avoid milk, cheese, ice cream and dairy products for 72 hours. Avoid caffeine (cola drinks, coffee, tea, Mountain Dew, Mellow Yellow) Take in small amounts, but frequently. Tylenol and/or Advil as needed for aches pains and fever     Arrie Senate MD

## 2015-03-24 NOTE — Patient Instructions (Signed)
Decrease the use of caffeine as much as possible Clear liquids for 24 hours (like 7-Up, ginger ale, Sprite, Jello, frozen pops) Full liquids the second 24-hours (like potato soup, tomato soup, chicken noodle soup) Bland diet the third 24-hours (boiled and baked foods, no fried or greasy foods) Avoid milk, cheese, ice cream and dairy products for 72 hours. Avoid caffeine (cola drinks, coffee, tea, Mountain Dew, Mellow Yellow) Take in small amounts, but frequently. Tylenol and/or Advil as needed for aches pains and fever

## 2015-03-25 LAB — BMP8+EGFR
BUN/Creatinine Ratio: 12 (ref 11–26)
BUN: 10 mg/dL (ref 8–27)
CO2: 22 mmol/L (ref 18–29)
Calcium: 9.5 mg/dL (ref 8.7–10.3)
Chloride: 103 mmol/L (ref 97–108)
Creatinine, Ser: 0.86 mg/dL (ref 0.57–1.00)
GFR calc Af Amer: 83 mL/min/{1.73_m2} (ref >59)
GFR calc non Af Amer: 72 mL/min/{1.73_m2} (ref >59)
Glucose: 127 mg/dL — ABNORMAL HIGH (ref 65–99)
Potassium: 4.5 mmol/L (ref 3.5–5.2)
Sodium: 141 mmol/L (ref 134–144)

## 2015-03-25 LAB — THYROID PANEL WITH TSH
Free Thyroxine Index: 2.7 (ref 1.2–4.9)
T3 Uptake Ratio: 26 % (ref 24–39)
T4, Total: 10.4 ug/dL (ref 4.5–12.0)
TSH: 2.21 u[IU]/mL (ref 0.450–4.500)

## 2015-03-26 ENCOUNTER — Telehealth: Payer: Self-pay | Admitting: Family Medicine

## 2015-03-26 LAB — URINE CULTURE

## 2015-03-26 LAB — POCT GLYCOSYLATED HEMOGLOBIN (HGB A1C): Hemoglobin A1C: 5.5

## 2015-03-26 MED ORDER — LORAZEPAM 0.5 MG PO TABS: ORAL_TABLET | ORAL | Status: DC

## 2015-03-26 NOTE — Telephone Encounter (Signed)
Please start this patient on lorazepam 0.5 one half tablet twice daily if needed #30 and Lexapro 10 #30 one half daily for 1 week and then one daily after that Have her return to the clinic for recheck in about 4 weeks

## 2015-03-26 NOTE — Addendum Note (Signed)
Addended by: Earlene Plater on: 03/26/2015 08:38 AM   Modules accepted: Orders

## 2015-03-26 NOTE — Telephone Encounter (Signed)
Pt aware and doesn't want the daily Lexapro - just the PRN lorazepam at this time --- she will follow up next

## 2015-03-26 NOTE — Telephone Encounter (Signed)
Patient feels stressed.  Feels like a weight on her chest when she lies down but some better with being up and moving.  Nervous and jittery feeling and can not sleep.  Please send in some medication to help her relax and rest.

## 2015-04-01 ENCOUNTER — Ambulatory Visit (INDEPENDENT_AMBULATORY_CARE_PROVIDER_SITE_OTHER): Payer: BLUE CROSS/BLUE SHIELD | Admitting: Family Medicine

## 2015-04-01 ENCOUNTER — Encounter: Payer: Self-pay | Admitting: Family Medicine

## 2015-04-01 VITALS — BP 135/86 | HR 84 | Temp 97.0°F | Ht 64.0 in | Wt 161.4 lb

## 2015-04-01 DIAGNOSIS — R350 Frequency of micturition: Secondary | ICD-10-CM | POA: Diagnosis not present

## 2015-04-01 DIAGNOSIS — R1032 Left lower quadrant pain: Secondary | ICD-10-CM

## 2015-04-01 DIAGNOSIS — R739 Hyperglycemia, unspecified: Secondary | ICD-10-CM | POA: Diagnosis not present

## 2015-04-01 DIAGNOSIS — R002 Palpitations: Secondary | ICD-10-CM

## 2015-04-01 DIAGNOSIS — R197 Diarrhea, unspecified: Secondary | ICD-10-CM | POA: Diagnosis not present

## 2015-04-01 LAB — POCT URINALYSIS DIPSTICK
Bilirubin, UA: NEGATIVE
Glucose, UA: NEGATIVE
Ketones, UA: NEGATIVE
Leukocytes, UA: NEGATIVE
Nitrite, UA: NEGATIVE
Spec Grav, UA: >=1.03
Urobilinogen, UA: NEGATIVE
pH, UA: 5

## 2015-04-01 LAB — POCT UA - MICROSCOPIC ONLY
Casts, Ur, LPF, POC: NEGATIVE
Crystals, Ur, HPF, POC: NEGATIVE
Yeast, UA: NEGATIVE

## 2015-04-01 NOTE — Patient Instructions (Signed)
She should continue to drink plenty of fluids She should avoid caffeine intake She should finish her antibiotic

## 2015-04-01 NOTE — Progress Notes (Signed)
Subjective:    Patient ID: Krista Henderson, female    DOB: Feb 08, 1951, 64 y.o.   MRN: [id number]  HPI Patient is here for a 1 week recheck on the diarrhea, UTI, and the trouble breathing she was having. Patient states that her symptoms have improved. The patient comes in today for follow-up. At the last visit she was having chills palpitations insomnia and had had some loose bowel movements. She is feeling better today. He was found on a urine specimen that she did have some pyuria and she was treated with Cipro when the culture was returned and that was the appropriate choice of antibiotic. Her recent labs will be reviewed with her during the visit today. She was given a copy of these labs. The patient indicates she has almost stopped all caffeine intake. She is feeling better with her bladder but didn't realize she was having a problem previously. She has more one more antibiotic to take. She is voiding well. She's not had any more loose bowel movements. She is resting better at night and only took one half of one lorazepam one time.   Review of Systems  Constitutional: Negative.   HENT: Negative.   Eyes: Negative.   Respiratory: Negative.   Cardiovascular: Negative.   Gastrointestinal: Negative.   Endocrine: Negative.   Genitourinary: Negative.   Musculoskeletal: Negative.   Skin: Negative.   Allergic/Immunologic: Negative.   Neurological: Negative.   Hematological: Negative.   Psychiatric/Behavioral: Negative.           Patient Active Problem List   Diagnosis Date Noted  . Shingles 05/11/2014  . Vitamin D deficiency 09/02/2013  . Hyperlipidemia 09/02/2013  . 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   Outpatient Encounter Prescriptions as of 04/01/2015  Medication Sig  . Calcium Carbonate-Vitamin D 600-400 MG-UNIT per tablet Take 1 tablet by mouth daily. With magnesium  . ciprofloxacin (CIPRO) 500 MG tablet Take 1 tablet (500 mg total) by mouth 2 (two) times daily.  Marland Kitchen estradiol  (VIVELLE-DOT) 0.0375 MG/24HR Place 1 patch onto the skin 2 (two) times a week.  Marland Kitchen LORazepam (ATIVAN) 0.5 MG tablet Take 1/2 tab BID PRN  . Vitamin D, Ergocalciferol, (DRISDOL) 50000 UNITS CAPS capsule TAKE 1 CAPSULE BY MOUTH EVERY WEEK   No facility-administered encounter medications on file as of 04/01/2015.       Objective:   Physical Exam  Constitutional: She is oriented to person, place, and time. She appears well-developed and well-nourished.  The patient today is pleasant, alert and calm. She is almost off the caffeine. She is almost finished her antibiotic.  HENT:  Head: Normocephalic.  Eyes: Conjunctivae and EOM are normal. Pupils are equal, round, and reactive to light. Right eye exhibits no discharge. Left eye exhibits no discharge. No scleral icterus.  Neck: Normal range of motion. Neck supple. No thyromegaly present.  Cardiovascular: Normal rate, regular rhythm and normal heart sounds.   No murmur heard. At 72/m  Pulmonary/Chest: Effort normal and breath sounds normal. No respiratory distress. She has no wheezes. She has no rales. She exhibits no tenderness.  Abdominal: Soft. Bowel sounds are normal. She exhibits no mass. There is no tenderness. There is no rebound and no guarding.  No masses tenderness or organ enlargement  Musculoskeletal: Normal range of motion. She exhibits no edema or tenderness.  Lymphadenopathy:    She has no cervical adenopathy.  Neurological: She is alert and oriented to person, place, and time. She has normal reflexes. No cranial nerve deficit.  Skin: Skin  is warm and dry. No rash noted.  Psychiatric: She has a normal mood and affect. Her behavior is normal. Judgment and thought content normal.  Nursing note and vitals reviewed.   BP 135/86 mmHg  Pulse 84  Temp(Src) 97 F (36.1 C) (Oral)  Ht 5\' 4"  (1.626 m)  Wt 161 lb 6.4 oz (73.211 kg)  BMI 27.69 kg/m2  LMP 08/18/1989         Assessment & Plan:  1. Frequent urination -We will do one  follow-up urine today to make sure that the infection has cleared. She should finish any remaining antibiotic - POCT urinalysis dipstick - POCT UA - Microscopic Only  2. Palpitations -The patient is no longer having any palpitations this may be due to her reduction in caffeine.  3. Diarrhea -She is no longer having any loose bowel movements  4. Left lower quadrant pain -This has also resolved  5. Elevated blood sugar level -The patient's blood sugar was elevated but her A1c was 5.5% and she is relieved to know that this is good because of the strong family history of diabetes  Patient Instructions  She should continue to drink plenty of fluids She should avoid caffeine intake She should finish her antibiotic    Arrie Senate MD

## 2015-05-06 ENCOUNTER — Telehealth: Payer: Self-pay | Admitting: Nurse Practitioner

## 2015-05-06 NOTE — Telephone Encounter (Signed)
Patient has a question regarding her medication. °

## 2015-05-06 NOTE — Telephone Encounter (Signed)
Spoke with patient. Patient states that when she went to the pharmacy to pick up her Vivelle dot 0.0375 mg patches she was advised the cost had gone up to $166. Was able to get the Minivelle 0.0375 mg patches for $99. Asking if this is okay. Advised as long as they are the same dosage it is okay for her to use the Minivelle patch to reduce cost. Patient is agreeable and verbalizes understanding.  Routing to provider for final review. Patient agreeable to disposition. Will close encounter.   Patient aware provider will review message and nurse will return call if any additional advice or change of disposition.

## 2015-05-26 ENCOUNTER — Other Ambulatory Visit: Payer: Self-pay | Admitting: Family Medicine

## 2015-05-26 NOTE — Telephone Encounter (Signed)
Last seen 04/01/15 DWM   Last VIT D 07/07/14  32.2

## 2015-08-17 ENCOUNTER — Telehealth: Payer: Self-pay | Admitting: Family Medicine

## 2015-08-17 NOTE — Telephone Encounter (Signed)
It is okay to get labs drawn and see me for her physical exam in January

## 2015-08-17 NOTE — Telephone Encounter (Signed)
Patient wants to know if she can have labs drawn in December and see you for her physical with Dr. Laurance Flatten in January.

## 2015-08-18 NOTE — Telephone Encounter (Signed)
Detailed message left for patient.

## 2015-08-24 ENCOUNTER — Encounter: Payer: BLUE CROSS/BLUE SHIELD | Admitting: Family Medicine

## 2015-08-24 ENCOUNTER — Ambulatory Visit: Payer: BLUE CROSS/BLUE SHIELD | Admitting: Family Medicine

## 2015-09-24 ENCOUNTER — Other Ambulatory Visit: Payer: Self-pay

## 2015-09-24 ENCOUNTER — Other Ambulatory Visit: Payer: Self-pay | Admitting: Nurse Practitioner

## 2015-09-24 ENCOUNTER — Telehealth: Payer: Self-pay | Admitting: Nurse Practitioner

## 2015-09-24 DIAGNOSIS — D72819 Decreased white blood cell count, unspecified: Secondary | ICD-10-CM

## 2015-09-24 MED ORDER — ESTRADIOL 0.0375 MG/24HR TD PTTW: 1.0000 | MEDICATED_PATCH | TRANSDERMAL | Status: DC

## 2015-09-24 MED ORDER — VIVELLE-DOT 0.0375 MG/24HR TD PTTW: 1.0000 | MEDICATED_PATCH | TRANSDERMAL | Status: DC

## 2015-09-24 NOTE — Telephone Encounter (Signed)
Patient left message on our answering machine needs 3 month supply of vivelle dot 0.0375 mg due to having new insurance and the pharmacy not having her old prescription.

## 2015-09-24 NOTE — Telephone Encounter (Signed)
Patient is needing brand name vivelle dot sent in to [facility] [place] until her AEX in April.  She had a rx sent in 12/17/2014 apparently they don't have that anymore. Patient is then needing Ms. Patty to talk to her insurance company so she can have the minivelle patches sent in going forward. [phone]

## 2015-09-24 NOTE — Progress Notes (Signed)
The medication was listed as DAW - so I just refilled this.

## 2015-09-24 NOTE — Telephone Encounter (Signed)
Spoke with patient. Advised rx for Vivelle dot 0.0375 mg patches #24 0RF have been [facility] to [facility] [place]. Patient is agreeable and verbalizes understanding. Patient would like to change her aex appointment to see an MD only. Appointment rescheduled with Dr.Miller for 12/20/2015 at 2:45 pm. Agreeable to date and time.  Routing to provider for final review. Patient agreeable to disposition. Will close encounter.

## 2015-09-24 NOTE — Telephone Encounter (Signed)
Attempted to reach patient at number provided [phone]. Phone rang and rang with no answer. No voicemail available. Left message at mobile number provided [phone].

## 2015-09-29 ENCOUNTER — Other Ambulatory Visit: Payer: Self-pay | Admitting: Family Medicine

## 2015-10-06 ENCOUNTER — Encounter: Payer: BLUE CROSS/BLUE SHIELD | Admitting: Family Medicine

## 2015-10-27 ENCOUNTER — Encounter: Payer: BLUE CROSS/BLUE SHIELD | Admitting: Family Medicine

## 2015-11-12 ENCOUNTER — Other Ambulatory Visit: Payer: Self-pay | Admitting: Nurse Practitioner

## 2015-11-12 NOTE — Telephone Encounter (Signed)
Medication refill request: Vivelle Last AEX:  12/17/14 PG Next AEX: 12/20/15 PG Last MMG (if hormonal medication request): 12/05/14 BI-RADS CATEGORY 1: Negative. Refill authorized: 09/24/15 #24 patch 0 Refill Pharmacy requesting 8 patches 0 Refills  Today:  Refused: Patient should still have enough patches.  Refusal sent to pharmacy.

## 2015-11-23 ENCOUNTER — Other Ambulatory Visit: Payer: BLUE CROSS/BLUE SHIELD

## 2015-11-23 DIAGNOSIS — E785 Hyperlipidemia, unspecified: Secondary | ICD-10-CM

## 2015-11-23 DIAGNOSIS — E559 Vitamin D deficiency, unspecified: Secondary | ICD-10-CM

## 2015-11-23 DIAGNOSIS — Z0000 Encounter for general adult medical examination without abnormal findings: Secondary | ICD-10-CM

## 2015-11-24 ENCOUNTER — Telehealth: Payer: Self-pay | Admitting: Family Medicine

## 2015-11-24 LAB — BMP8+EGFR
BUN/Creatinine Ratio: 17 (ref 11–26)
BUN: 14 mg/dL (ref 8–27)
CO2: 24 mmol/L (ref 18–29)
Calcium: 9.3 mg/dL (ref 8.7–10.3)
Chloride: 101 mmol/L (ref 96–106)
Creatinine, Ser: 0.83 mg/dL (ref 0.57–1.00)
GFR calc Af Amer: 86 mL/min/{1.73_m2} (ref >59)
GFR calc non Af Amer: 75 mL/min/{1.73_m2} (ref >59)
Glucose: 110 mg/dL — ABNORMAL HIGH (ref 65–99)
Potassium: 4.3 mmol/L (ref 3.5–5.2)
Sodium: 141 mmol/L (ref 134–144)

## 2015-11-24 LAB — CBC WITH DIFFERENTIAL/PLATELET
Basophils Absolute: 0 10*3/uL (ref 0.0–0.2)
Basos: 1 %
EOS (ABSOLUTE): 0.1 10*3/uL (ref 0.0–0.4)
Eos: 2 %
Hematocrit: 39.2 % (ref 34.0–46.6)
Hemoglobin: 13.4 g/dL (ref 11.1–15.9)
Immature Grans (Abs): 0 10*3/uL (ref 0.0–0.1)
Immature Granulocytes: 0 %
Lymphocytes Absolute: 1.3 10*3/uL (ref 0.7–3.1)
Lymphs: 36 %
MCH: 32.3 pg (ref 26.6–33.0)
MCHC: 34.2 g/dL (ref 31.5–35.7)
MCV: 95 fL (ref 79–97)
Monocytes Absolute: 0.3 10*3/uL (ref 0.1–0.9)
Monocytes: 8 %
Neutrophils Absolute: 1.9 10*3/uL (ref 1.4–7.0)
Neutrophils: 53 %
Platelets: 189 10*3/uL (ref 150–379)
RBC: 4.15 x10E6/uL (ref 3.77–5.28)
RDW: 12.7 % (ref 12.3–15.4)
WBC: 3.6 10*3/uL (ref 3.4–10.8)

## 2015-11-24 LAB — HEPATIC FUNCTION PANEL
ALT: 12 IU/L (ref 0–32)
AST: 18 IU/L (ref 0–40)
Albumin: 4.5 g/dL (ref 3.6–4.8)
Alkaline Phosphatase: 71 IU/L (ref 39–117)
Bilirubin Total: 0.9 mg/dL (ref 0.0–1.2)
Bilirubin, Direct: 0.18 mg/dL (ref 0.00–0.40)
Total Protein: 6.7 g/dL (ref 6.0–8.5)

## 2015-11-24 LAB — LIPID PANEL
Chol/HDL Ratio: 3.7 ratio units (ref 0.0–4.4)
Cholesterol, Total: 217 mg/dL — ABNORMAL HIGH (ref 100–199)
HDL: 59 mg/dL (ref >39)
LDL Calculated: 127 mg/dL — ABNORMAL HIGH (ref 0–99)
Triglycerides: 154 mg/dL — ABNORMAL HIGH (ref 0–149)
VLDL Cholesterol Cal: 31 mg/dL (ref 5–40)

## 2015-11-24 LAB — VITAMIN D 25 HYDROXY (VIT D DEFICIENCY, FRACTURES): Vit D, 25-Hydroxy: 25.8 ng/mL — ABNORMAL LOW (ref 30.0–100.0)

## 2015-11-24 NOTE — Telephone Encounter (Signed)
Aware of lab results. Does not want to take a statin.

## 2015-11-29 ENCOUNTER — Encounter: Payer: Self-pay | Admitting: Family Medicine

## 2015-11-29 ENCOUNTER — Ambulatory Visit (INDEPENDENT_AMBULATORY_CARE_PROVIDER_SITE_OTHER): Payer: BLUE CROSS/BLUE SHIELD | Admitting: Family Medicine

## 2015-11-29 VITALS — BP 140/78 | HR 88 | Temp 97.7°F | Ht 64.0 in | Wt 167.0 lb

## 2015-11-29 DIAGNOSIS — E785 Hyperlipidemia, unspecified: Secondary | ICD-10-CM

## 2015-11-29 DIAGNOSIS — E559 Vitamin D deficiency, unspecified: Secondary | ICD-10-CM

## 2015-11-29 DIAGNOSIS — Z1211 Encounter for screening for malignant neoplasm of colon: Secondary | ICD-10-CM

## 2015-11-29 DIAGNOSIS — D72819 Decreased white blood cell count, unspecified: Secondary | ICD-10-CM

## 2015-11-29 DIAGNOSIS — Z0000 Encounter for general adult medical examination without abnormal findings: Secondary | ICD-10-CM

## 2015-11-29 MED ORDER — VITAMIN D (ERGOCALCIFEROL) 1.25 MG (50000 UNIT) PO CAPS: 50000.0000 [IU] | ORAL_CAPSULE | ORAL | Status: DC

## 2015-11-29 NOTE — Patient Instructions (Addendum)
Continue current medications. Continue good therapeutic lifestyle changes which include good diet and exercise. Fall precautions discussed with patient. If an FOBT was given today- please return it to our front desk. If you are over 65 years old - you may need Prevnar 55 or the adult Pneumonia vaccine.  **Flu shots are available--- please call and schedule a FLU-CLINIC appointment**  After your visit with [place] today you will receive a survey in the mail or online from [employer] regarding your care with [place]. Please take a moment to fill this out. Your feedback is very important to [place] as you can help [place] better understand your patient needs as well as improve your experience and satisfaction. WE CARE ABOUT YOU!!!   The patient should try the Crestor 5 mg 1 daily at bedtime. If she develops any muscle aches or myalgias she should discontinue the medicine and call us She should also she should also continue with aggressive therapeutic lifestyle changes and patent more attention to diet. She should drink plenty of fluids and stay well hydrated. If the problems with the hip continue she should get back in touch with [place] and we will arrange to do LS spine films and left hip films.

## 2015-11-29 NOTE — Progress Notes (Signed)
Subjective:    Patient ID: Krista Henderson, female    DOB: [birth year], 65 y.o.   MRN: [id number]  HPI Patient is here today for annual wellness exam and follow up of chronic medical problems which includes hyperlipidemia. She is taking medications regularly. The patient's only complaint today is some arthralgias in her left hip. She has already had her lab work done and we will review these results with her during the visit today. She had a normal CBC and an adequate platelet count. The blood sugars elevated at 110 and a creatinine and electrolytes were within normal limits. The vitamin D remains low despite taking 50,000 units weekly of vitamin D. Cholesterol numbers with traditional lipid testing have a total LDL C that was elevated at 127. The triglycerides were elevated at 154 and the patient is currently not on a statin drug. The patient wore some high heels recently and has some hip pain following this. He has since resolved. She has not had any more left hip pain. She is due to get a DEXA scan and due to get a chest x-ray but wants to check with her insurance first. She will be given an FOBT to return. Her recent lab work was reviewed with her. The vitamin D level remains low and as a result of this discussion with her she will add vitamin D3 daily to her current treatment regimen of taking 50,000 once weekly. We have also talked her into trying a statin drug again because of her elevated LDL C. She will take Crestor 5 mg 1 daily generic and if she develops any muscle aches or myalgias she will discontinue this. She will continue to follow a good and aggressive therapeutic lifestyle changes in her diet. And try to get more exercise. She denies any GI symptoms heartburn indigestion nausea vomiting diarrhea or blood in the stool. She is not having any more urinary tract infections that she had back in the summer and she is passing her water without problems. Up-to-date on her eye exam.      Patient  Active Problem List   Diagnosis Date Noted  . Shingles 05/11/2014  . Vitamin D deficiency 09/02/2013  . Hyperlipidemia 09/02/2013  . 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   Outpatient Encounter Prescriptions as of 11/29/2015  Medication Sig  . Calcium Carbonate-Vitamin D 600-400 MG-UNIT per tablet Take 1 tablet by mouth daily. With magnesium  . ciprofloxacin (CIPRO) 500 MG tablet Take 1 tablet (500 mg total) by mouth 2 (two) times daily.  . Vitamin D, Ergocalciferol, (DRISDOL) 50000 units CAPS capsule TAKE 1 CAPSULE BY MOUTH EVERY WEEK  . VIVELLE-DOT 0.0375 MG/24HR Place 1 patch onto the skin 2 (two) times a week.  . [DISCONTINUED] LORazepam (ATIVAN) 0.5 MG tablet Take 1/2 tab BID PRN   No facility-administered encounter medications on file as of 11/29/2015.     Review of Systems  Constitutional: Negative.   HENT: Negative.   Eyes: Negative.   Respiratory: Negative.   Cardiovascular: Negative.   Gastrointestinal: Negative.   Endocrine: Negative.   Genitourinary: Negative.   Musculoskeletal: Positive for arthralgias (left hip pain).  Skin: Negative.   Allergic/Immunologic: Negative.   Neurological: Negative.   Hematological: Negative.   Psychiatric/Behavioral: Negative.        Objective:   Physical Exam  Constitutional: She is oriented to person, place, and time. She appears well-developed and well-nourished. No distress.  HENT:  Head: Normocephalic and atraumatic.  Right Ear: External ear normal.  Left Ear: External ear  normal.  Nose: Nose normal.  Mouth/Throat: Oropharynx is clear and moist.  Eyes: Conjunctivae and EOM are normal. Pupils are equal, round, and reactive to light. Right eye exhibits no discharge. Left eye exhibits no discharge. No scleral icterus.  Neck: Normal range of motion. Neck supple. No thyromegaly present.  Cardiovascular: Normal rate, regular rhythm, normal heart sounds and intact distal pulses.   No murmur heard. Heart had a regular rate and rhythm at 84/m    Pulmonary/Chest: Effort normal and breath sounds normal. No respiratory distress. She has no wheezes. She has no rales. She exhibits no tenderness.  Clear anteriorly and posteriorly  Abdominal: Soft. Bowel sounds are normal. She exhibits no mass. There is no tenderness. There is no rebound and no guarding.  No abdominal tenderness liver or spleen enlargement no bruits and no inguinal adenopathy  Musculoskeletal: Normal range of motion. She exhibits no edema.  Lymphadenopathy:    She has no cervical adenopathy.  Neurological: She is alert and oriented to person, place, and time. She has normal reflexes. No cranial nerve deficit.  Skin: Skin is warm and dry. No rash noted.  Psychiatric: She has a normal mood and affect. Her behavior is normal. Judgment and thought content normal.  Nursing note and vitals reviewed.  BP 144/90 mmHg  Pulse 88  Temp(Src) 97.7 F (36.5 C) (Oral)  Ht 5\' 4"  (1.626 m)  Wt 167 lb (75.751 kg)  BMI 28.65 kg/m2  LMP 08/18/1989        Assessment & Plan:  1. Vitamin D deficiency -Increase vitamin D3 by taking 1000 units daily in addition to the 50,000 units weekly  2. Hyperlipidemia -Continue aggressive therapeutic lifestyle changes and consider adding Crestor 5 mg  3. Annual physical exam -Return FOBT -Get DEXA scan -Get chest x-ray -Check with insurance regarding LS spine and left hip - Fecal occult blood, imunochemical; Future  4. Leukopenia -This is within normal limits on the CBC that was done recently.  5. Special screening for malignant neoplasms, colon - Fecal occult blood, imunochemical; Future  Meds ordered this encounter  Medications  . Vitamin D, Ergocalciferol, (DRISDOL) 50000 units CAPS capsule    Sig: Take 1 capsule (50,000 Units total) by mouth once a week.    Dispense:  12 capsule    Refill:  3   Patient Instructions  Continue current medications. Continue good therapeutic lifestyle changes which include good diet and  exercise. Fall precautions discussed with patient. If an FOBT was given today- please return it to our front desk. If you are over 68 years old - you may need Prevnar 63 or the adult Pneumonia vaccine.  **Flu shots are available--- please call and schedule a FLU-CLINIC appointment**  After your visit with [place] today you will receive a survey in the mail or online from [employer] regarding your care with [place]. Please take a moment to fill this out. Your feedback is very important to [place] as you can help [place] better understand your patient needs as well as improve your experience and satisfaction. WE CARE ABOUT YOU!!!   The patient should try the Crestor 5 mg 1 daily at bedtime. If she develops any muscle aches or myalgias she should discontinue the medicine and call us She should also she should also continue with aggressive therapeutic lifestyle changes and patent more attention to diet. She should drink plenty of fluids and stay well hydrated. If the problems with the hip continue she should get back in touch with [place]  and we will arrange to do LS spine films and left hip films.    Arrie Senate MD

## 2015-12-07 ENCOUNTER — Other Ambulatory Visit: Payer: Self-pay

## 2015-12-07 DIAGNOSIS — Z1231 Encounter for screening mammogram for malignant neoplasm of breast: Secondary | ICD-10-CM

## 2015-12-20 ENCOUNTER — Ambulatory Visit (INDEPENDENT_AMBULATORY_CARE_PROVIDER_SITE_OTHER): Payer: BLUE CROSS/BLUE SHIELD | Admitting: Obstetrics & Gynecology

## 2015-12-20 ENCOUNTER — Encounter: Payer: Self-pay | Admitting: Obstetrics & Gynecology

## 2015-12-20 VITALS — BP 132/70 | HR 84 | Resp 16 | Ht 64.25 in | Wt 167.0 lb

## 2015-12-20 DIAGNOSIS — Z79890 Hormone replacement therapy (postmenopausal): Secondary | ICD-10-CM | POA: Diagnosis not present

## 2015-12-20 DIAGNOSIS — Z0000 Encounter for general adult medical examination without abnormal findings: Secondary | ICD-10-CM | POA: Diagnosis not present

## 2015-12-20 DIAGNOSIS — D72819 Decreased white blood cell count, unspecified: Secondary | ICD-10-CM

## 2015-12-20 DIAGNOSIS — Z01419 Encounter for gynecological examination (general) (routine) without abnormal findings: Secondary | ICD-10-CM | POA: Diagnosis not present

## 2015-12-20 LAB — POCT URINALYSIS DIPSTICK
Bilirubin, UA: NEGATIVE
Blood, UA: NEGATIVE
Glucose, UA: NEGATIVE
Ketones, UA: NEGATIVE
Nitrite, UA: NEGATIVE
Protein, UA: NEGATIVE
Urobilinogen, UA: NEGATIVE
pH, UA: 5

## 2015-12-20 MED ORDER — ESTRADIOL 0.0375 MG/24HR TD PTTW: 1.0000 | MEDICATED_PATCH | TRANSDERMAL | Status: DC

## 2015-12-20 NOTE — Progress Notes (Signed)
65 y.o. [id number] MarriedCaucasianF here for annual exam.  Pt reports she continues to desire HRT.  She is clearly aware of risks including DVT, PE, stroke, MI, and breast cancer.  Denies vaginal bleeding.     Patient's last menstrual period was 08/18/1989.          Sexually active: Yes.    The current method of family planning is status post hysterectomy.    Exercising: Yes.    Walking Smoker:  no  Health Maintenance: Pap:  10/2000 Normal  History of abnormal Pap:  yes MMG:  12/16/14 BIRADS1:neg.  Has appt 12/22/15. Colonoscopy:  1/26/1.  Repeat 5 years  BMD:   2012 normal - per pt. Has appt tomorrow with PCP, Dr. Laurance Flatten.  TDaP:  05/2011 Screening Labs: PCP, Urine today: pending   reports that she has never smoked. She has never used smokeless tobacco. She reports that she does not drink alcohol or use illicit drugs.  Past Medical History  Diagnosis Date  . 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    WBC 3,000 08/30/11 52 P 43 L  . Vitamin D deficiency   . Hormone replacement therapy   . Hyperlipidemia   . Endometriosis   . Migraine without aura     Past Surgical History  Procedure Laterality Date  . Abdominal laporoscopic surgery    . Cesarean section      x2 '84 & '85  . Appendectomy  1965    7th grade in the '60's  . Total abdominal hysterectomy w/ bilateral salpingoophorectomy  08/1989    endometriosis    Current Outpatient Prescriptions  Medication Sig Dispense Refill  . Calcium Carbonate-Vitamin D 600-400 MG-UNIT per tablet Take 1 tablet by mouth daily. With magnesium    . Vitamin D, Ergocalciferol, (DRISDOL) 50000 units CAPS capsule Take 1 capsule (50,000 Units total) by mouth once a week. 12 capsule 3  . VIVELLE-DOT 0.0375 MG/24HR Place 1 patch onto the skin 2 (two) times a week. 24 patch 0   No current facility-administered medications for this visit.    Family History  Problem Relation Age of Onset  . Diabetes Mother   . Osteoporosis Mother   . Hypertension Mother   . Colon  cancer Father 50  . Stomach cancer Paternal Grandfather   . Esophageal cancer Neg Hx   . Rectal cancer Neg Hx   . Breast cancer Paternal Aunt     80's    ROS:  Pertinent items are noted in HPI.  Otherwise, a comprehensive ROS was negative.  Exam:   BP 132/70 mmHg  Pulse 84  Resp 16  Ht 5' 4.25" (1.632 m)  Wt 167 lb (75.751 kg)  BMI 28.44 kg/m2  LMP 08/18/1989  Weight change: @WEIGHTCHANGE @ Height:   Height: 5' 4.25" (163.2 cm)  Ht Readings from Last 3 Encounters:  12/20/15 5' 4.25" (1.632 m)  11/29/15 5\' 4"  (1.626 m)  04/01/15 5\' 4"  (1.626 m)    General appearance: alert, cooperative and appears stated age Head: Normocephalic, without obvious abnormality, atraumatic Neck: no adenopathy, supple, symmetrical, trachea midline and thyroid normal to inspection and palpation Lungs: clear to auscultation bilaterally Breasts: normal appearance, no masses or tenderness Heart: regular rate and rhythm Abdomen: soft, non-tender; bowel sounds normal; no masses,  no organomegaly Extremities: extremities normal, atraumatic, no cyanosis or edema Skin: Skin color, texture, turgor normal. No rashes or lesions Lymph nodes: Cervical, supraclavicular, and axillary nodes normal. No abnormal inguinal nodes palpated Neurologic: Grossly normal   Pelvic: External genitalia:  no lesions              Urethra:  normal appearing urethra with no masses, tenderness or lesions              Bartholins and Skenes: normal                 Vagina: normal appearing vagina with normal color and discharge, no lesions              Cervix: no lesions              Pap taken: No. Bimanual Exam:  Uterus:  uterus absent              Adnexa: no mass, fullness, tenderness               Rectovaginal: Confirms               Anus:  normal sphincter tone, no lesions  Chaperone was present for exam.  A:  Well Woman with normal exam S/P TAH / BSO secondary to endometriosis 08/1989 Postmenopausal on ERT since  08/1989 H/O enlarged thyroid Low WBC ct, has seen Dr. Beryle Beams Mildly elevated lipids   P:  Yearly MMG recommended.  Pt already has scheduled. Pap smear not indicated Refilled RX for Vivelle dot 0.375 mg for two months.  Rx for 3 months supply given to pt.  She will call and advise whether she wants to stay on generic. Returning to Dr. Laurance Flatten for BMD.  She plans to have Hep C testing done when back in his office.   AEX 1 year or follow-up prn

## 2015-12-21 ENCOUNTER — Other Ambulatory Visit: Payer: Self-pay | Admitting: Family Medicine

## 2015-12-21 ENCOUNTER — Ambulatory Visit: Payer: BLUE CROSS/BLUE SHIELD | Admitting: Nurse Practitioner

## 2015-12-21 ENCOUNTER — Other Ambulatory Visit: Payer: BLUE CROSS/BLUE SHIELD

## 2015-12-21 DIAGNOSIS — Z780 Asymptomatic menopausal state: Secondary | ICD-10-CM

## 2015-12-22 ENCOUNTER — Ambulatory Visit
Admission: RE | Admit: 2015-12-22 | Discharge: 2015-12-22 | Disposition: A | Discharge status: Home / Self Care | Payer: BLUE CROSS/BLUE SHIELD | Source: Ambulatory Visit

## 2015-12-22 DIAGNOSIS — Z1231 Encounter for screening mammogram for malignant neoplasm of breast: Secondary | ICD-10-CM

## 2016-02-21 ENCOUNTER — Telehealth: Payer: Self-pay | Admitting: Family Medicine

## 2016-02-21 MED ORDER — MECLIZINE HCL 12.5 MG PO TABS
ORAL_TABLET | ORAL | Status: DC
Start: 2016-02-21 — End: 2016-04-14

## 2016-02-21 NOTE — Telephone Encounter (Signed)
Meclizine 12.5 mg 14 times daily with food at bedtime with a snack for 1 week and then take as needed. #40. One refill. If the dizziness does not get better the patient needs to BE seen. She should stay well hydrated.

## 2016-02-21 NOTE — Telephone Encounter (Signed)
Received phone call from patient stating she has vertigo again and would like something sent to her pharmacy.  Patient states that she is has dizziness with standing and laying down since Saturday that has got worse.  Patient reports no other symptoms other than dizziness.

## 2016-02-21 NOTE — Telephone Encounter (Signed)
Patient aware.

## 2016-02-22 ENCOUNTER — Other Ambulatory Visit: Payer: Self-pay | Admitting: Obstetrics & Gynecology

## 2016-02-22 DIAGNOSIS — D72819 Decreased white blood cell count, unspecified: Secondary | ICD-10-CM

## 2016-02-22 NOTE — Telephone Encounter (Signed)
Patient would like a refill for the name brand vivelle dot to go to [facility]. [phone]

## 2016-02-22 NOTE — Telephone Encounter (Signed)
Medication refill request: Vivelle Patch  Last AEX:  12-20-15 Next AEX: 01-09-17 Last MMG (if hormonal medication request): 12-22-15 WNL  Refill authorized: please advise

## 2016-02-23 MED ORDER — VIVELLE-DOT 0.0375 MG/24HR TD PTTW: 1.0000 | MEDICATED_PATCH | TRANSDERMAL | Status: DC

## 2016-02-24 NOTE — Telephone Encounter (Signed)
Prescription for Vivelle Dot faxed to [facility] per patient request. Fax number [phone].

## 2016-02-25 MED FILL — VIVELLE-DOT 0.0375 MG PATCH: 0.0375 | 28 days supply | Qty: 8 | Fill #0

## 2016-03-16 ENCOUNTER — Telehealth: Payer: Self-pay | Admitting: Family Medicine

## 2016-03-16 NOTE — Telephone Encounter (Signed)
Patient given an appointment for 7/5 @ 1:45 with Laurance Flatten.

## 2016-03-22 ENCOUNTER — Ambulatory Visit (INDEPENDENT_AMBULATORY_CARE_PROVIDER_SITE_OTHER): Payer: BLUE CROSS/BLUE SHIELD | Admitting: Family Medicine

## 2016-03-22 ENCOUNTER — Encounter: Payer: Self-pay | Admitting: Family Medicine

## 2016-03-22 VITALS — BP 134/83 | HR 80 | Temp 97.8°F | Ht 64.25 in | Wt 166.6 lb

## 2016-03-22 DIAGNOSIS — M25552 Pain in left hip: Secondary | ICD-10-CM | POA: Diagnosis not present

## 2016-03-22 DIAGNOSIS — M79604 Pain in right leg: Secondary | ICD-10-CM | POA: Diagnosis not present

## 2016-03-22 NOTE — Progress Notes (Signed)
Subjective:    Patient ID: Krista Henderson, female    DOB: 1951-03-12, 65 y.o.   MRN: [id number]  HPI  Pt is here today for Right lower leg aching with swelling. These symptoms started last Monday.     Patient Active Problem List   Diagnosis Date Noted  . Hormone replacement therapy (HRT) 12/20/2015  . Shingles 05/11/2014  . Vitamin D deficiency 09/02/2013  . Hyperlipidemia 09/02/2013  . 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   Outpatient Encounter Prescriptions as of 03/22/2016  Medication Sig  . Calcium Carbonate-Vitamin D 600-400 MG-UNIT per tablet Take 1 tablet by mouth daily. With magnesium  . Vitamin D, Ergocalciferol, (DRISDOL) 50000 units CAPS capsule Take 1 capsule (50,000 Units total) by mouth once a week.  Marland Kitchen VIVELLE-DOT 0.0375 MG/24HR Place 1 patch onto the skin 2 (two) times a week.  . meclizine (ANTIVERT) 12.5 MG tablet Take 1 tab. 3 times daily with food for 1 week.  Then as needed. (Patient not taking: Reported on 03/22/2016)   No facility-administered encounter medications on file as of 03/22/2016.       Review of Systems  Constitutional: Negative.   HENT: Negative.   Eyes: Negative.   Respiratory: Negative.   Cardiovascular: Negative.   Gastrointestinal: Negative.   Endocrine: Negative.   Genitourinary: Negative.   Musculoskeletal: Positive for arthralgias (R lower leg ache with swelling, started last Monday).  Skin: Negative.   Allergic/Immunologic: Negative.   Neurological: Negative.   Psychiatric/Behavioral: Negative.        Objective:   Physical Exam  Constitutional: She is oriented to person, place, and time. She appears well-developed and well-nourished. No distress.  HENT:  Head: Normocephalic and atraumatic.  Nose: Nose normal.  Eyes: Conjunctivae and EOM are normal. Pupils are equal, round, and reactive to light. Right eye exhibits no discharge. Left eye exhibits no discharge. No scleral icterus.  Neck: Normal range of motion. Neck supple.  Abdominal: Soft.  Bowel sounds are normal. She exhibits no distension and no mass. There is tenderness. There is no rebound and no guarding.  Slight tenderness over left pelvic area  Musculoskeletal: Normal range of motion. She exhibits tenderness. She exhibits no edema.  The joint lines in both knees are tender to palpation with the right being greater than the left. The patient has good hip abduction and good leg raising bilaterally except slight tenderness or discomfort in the left pelvic area above the left hip joint.  Neurological: She is alert and oriented to person, place, and time.  Skin: Skin is warm and dry. No rash noted.  Psychiatric: She has a normal mood and affect. Her behavior is normal. Judgment and thought content normal.  Nursing note and vitals reviewed.   BP 134/83 mmHg  Pulse 80  Temp(Src) 97.8 F (36.6 C) (Oral)  Ht 5' 4.25" (1.632 m)  Wt 166 lb 9.6 oz (75.569 kg)  BMI 28.37 kg/m2  SpO2 97%  LMP 08/18/1989          Assessment & Plan:  1. Right leg pain -Use warm wet compresses and take ibuprofen as directed  2. Left hip pain -Use warm wet compresses and take ibuprofen as directed and limit walking for the next 10-14 days. -Wear good shoes with good support -If problems continue consider x-rays of left hip and right knee -Take ibuprofen as directed  Patient Instructions  Take ibuprofen one after breakfast and one after supper for the next 10-14 days Monitor blood pressures closely and watch sodium intake Use  warm wet compresses to the right leg where you have had more swelling 20 minutes 3 or 4 times daily Discontinue walking exercises for the next 10-14 days until left hip is feeling better and until right leg is feeling better. If no better in a couple weeks come back to the office and we will arrange to get an x-ray of the left hip and the right knee   Arrie Senate MD

## 2016-03-22 NOTE — Patient Instructions (Signed)
Take ibuprofen one after breakfast and one after supper for the next 10-14 days Monitor blood pressures closely and watch sodium intake Use warm wet compresses to the right leg where you have had more swelling 20 minutes 3 or 4 times daily Discontinue walking exercises for the next 10-14 days until left hip is feeling better and until right leg is feeling better. If no better in a couple weeks come back to the office and we will arrange to get an x-ray of the left hip and the right knee

## 2016-04-03 MED FILL — VIVELLE-DOT 0.0375 MG PATCH: 0.0375 | 28 days supply | Qty: 8 | Fill #1

## 2016-04-14 ENCOUNTER — Encounter: Payer: Self-pay | Admitting: Pediatrics

## 2016-04-14 ENCOUNTER — Ambulatory Visit (INDEPENDENT_AMBULATORY_CARE_PROVIDER_SITE_OTHER): Payer: BLUE CROSS/BLUE SHIELD | Admitting: Pediatrics

## 2016-04-14 VITALS — BP 131/81 | HR 89 | Temp 97.8°F | Ht 64.25 in | Wt 164.2 lb

## 2016-04-14 DIAGNOSIS — A059 Bacterial foodborne intoxication, unspecified: Secondary | ICD-10-CM | POA: Diagnosis not present

## 2016-04-14 DIAGNOSIS — Z79890 Hormone replacement therapy (postmenopausal): Secondary | ICD-10-CM

## 2016-04-14 DIAGNOSIS — R0789 Other chest pain: Secondary | ICD-10-CM | POA: Diagnosis not present

## 2016-04-14 NOTE — Patient Instructions (Signed)
Famotidine or ranitidine Take twice a day 30 minutes before eating

## 2016-04-14 NOTE — Progress Notes (Signed)
    Subjective:    Patient ID: Krista Henderson, female    DOB: 06-06-1951, 65 y.o.   MRN: [id number]  CC: Gastroesophageal Reflux   HPI: Krista Henderson is a 65 y.o. female presenting for Gastroesophageal Reflux  Last night ate dinner at 6pm, had lasagna and salad Chest started burning badly after that 3 tums helped some when she stood up Throughout all of dinner Then chest really burned Vomited for 30 minutes, then intermittently, started after the cayopeptate  Felt sweaty High BP--160/99 at that time Usually 120s/70s Temp was fine Every time she layed down last night started burping tok 3 doses of sucralfate, bloated her stomach This morning feeling weak Still burping Ate boiled egg, toast Now feeling weak, no burning but says it doesn't feel right Husband with diarrhea right after eating  Fam hx: mother at 53yo with stroke   Depression screen [facility] 2/9 04/14/2016 03/22/2016 11/29/2015 11/29/2015  Decreased Interest 0 0 0 0  Down, Depressed, Hopeless 0 0 0 0  PHQ - 2 Score 0 0 0 0     Relevant past medical, surgical, family and social history reviewed and updated. Interim medical history since our last visit reviewed. Allergies and medications reviewed and updated.  History  Smoking Status  . Never Smoker  Smokeless Tobacco  . Never Used    ROS: Per HPI   EKG: normal rate, normal sinus rhythm, no T wave inversions     Objective:    BP 131/81 (BP Location: Right Arm, Patient Position: Sitting, Cuff Size: Normal)   Pulse 89   Temp 97.8 F (36.6 C) (Oral)   Ht 5' 4.25" (1.632 m)   Wt 164 lb 3.2 oz (74.5 kg)   LMP 08/18/1989   BMI 27.97 kg/m   Wt Readings from Last 3 Encounters:  04/14/16 164 lb 3.2 oz (74.5 kg)  03/22/16 166 lb 9.6 oz (75.6 kg)  12/20/15 167 lb (75.8 kg)     Gen: NAD, alert, cooperative with exam, NCAT EYES: EOMI, no scleral injection or icterus ENT:  TMs pearly gray b/l, OP without erythema LYMPH: no cervical LAD CV: NRRR, normal  S1/S2, no murmur, distal pulses 2+ b/l Resp: CTABL, no wheezes, normal WOB Abd: +BS, soft, NTND. no guarding or organomegaly Ext: No edema, warm Neuro: Alert and oriented     Assessment & Plan:    Umeka was seen today for chest pain, vomiting.  Diagnoses and all orders for this visit:  Chest tightness Likely due to reflux/vomiting from food poisoning.  EKG was normal -     EKG 12-Lead  Hormone replacement therapy (HRT) Will continue decreasing as able Pt just picked up new month Rx Will decrease dose if needed to help with wean May need premarin cream for vaginal dryness, had in the past Pt will let u sknow if symptomatic with decrease  Burning chest pain Famotidine as needed  Food poisoning Husband with similar symptoms, improved now overall Let me know if symptoms return.    Follow up plan: As needed  Assunta Found, MD [facility] 04/14/2016, 1:59 PM

## 2016-04-26 ENCOUNTER — Other Ambulatory Visit: Payer: BLUE CROSS/BLUE SHIELD

## 2016-04-26 ENCOUNTER — Other Ambulatory Visit: Payer: Self-pay | Admitting: *Deleted

## 2016-04-26 DIAGNOSIS — E559 Vitamin D deficiency, unspecified: Secondary | ICD-10-CM

## 2016-04-26 DIAGNOSIS — Z79890 Hormone replacement therapy (postmenopausal): Secondary | ICD-10-CM

## 2016-04-26 DIAGNOSIS — E785 Hyperlipidemia, unspecified: Secondary | ICD-10-CM

## 2016-04-27 ENCOUNTER — Telehealth: Payer: Self-pay | Admitting: Family Medicine

## 2016-04-27 ENCOUNTER — Other Ambulatory Visit: Payer: Self-pay | Admitting: Pediatrics

## 2016-04-27 LAB — CMP14+EGFR
ALT: 18 IU/L (ref 0–32)
AST: 37 IU/L (ref 0–40)
Albumin/Globulin Ratio: 1.9 (ref 1.2–2.2)
Albumin: 4.6 g/dL (ref 3.6–4.8)
Alkaline Phosphatase: 77 IU/L (ref 39–117)
BUN/Creatinine Ratio: 16 (ref 12–28)
BUN: 15 mg/dL (ref 8–27)
Bilirubin Total: 1.7 mg/dL — ABNORMAL HIGH (ref 0.0–1.2)
CO2: 23 mmol/L (ref 18–29)
Calcium: 9.6 mg/dL (ref 8.7–10.3)
Chloride: 98 mmol/L (ref 96–106)
Creatinine, Ser: 0.93 mg/dL (ref 0.57–1.00)
GFR calc Af Amer: 75 mL/min/{1.73_m2} (ref >59)
GFR calc non Af Amer: 65 mL/min/{1.73_m2} (ref >59)
Globulin, Total: 2.4 g/dL (ref 1.5–4.5)
Glucose: 108 mg/dL — ABNORMAL HIGH (ref 65–99)
Potassium: 4.1 mmol/L (ref 3.5–5.2)
Sodium: 140 mmol/L (ref 134–144)
Total Protein: 7 g/dL (ref 6.0–8.5)

## 2016-04-27 LAB — CBC WITH DIFFERENTIAL/PLATELET
Basophils Absolute: 0 10*3/uL (ref 0.0–0.2)
Basos: 1 %
EOS (ABSOLUTE): 0.1 10*3/uL (ref 0.0–0.4)
Eos: 1 %
Hematocrit: 40 % (ref 34.0–46.6)
Hemoglobin: 13.5 g/dL (ref 11.1–15.9)
Immature Grans (Abs): 0 10*3/uL (ref 0.0–0.1)
Immature Granulocytes: 0 %
Lymphocytes Absolute: 1.1 10*3/uL (ref 0.7–3.1)
Lymphs: 32 %
MCH: 31.7 pg (ref 26.6–33.0)
MCHC: 33.8 g/dL (ref 31.5–35.7)
MCV: 94 fL (ref 79–97)
Monocytes Absolute: 0.3 10*3/uL (ref 0.1–0.9)
Monocytes: 7 %
Neutrophils Absolute: 2.1 10*3/uL (ref 1.4–7.0)
Neutrophils: 59 %
Platelets: 199 10*3/uL (ref 150–379)
RBC: 4.26 x10E6/uL (ref 3.77–5.28)
RDW: 12.9 % (ref 12.3–15.4)
WBC: 3.5 10*3/uL (ref 3.4–10.8)

## 2016-04-27 LAB — THYROID PANEL WITH TSH
Free Thyroxine Index: 2.3 (ref 1.2–4.9)
T3 Uptake Ratio: 25 % (ref 24–39)
T4, Total: 9.2 ug/dL (ref 4.5–12.0)
TSH: 2.7 u[IU]/mL (ref 0.450–4.500)

## 2016-04-27 LAB — LIPID PANEL
Chol/HDL Ratio: 3.7 ratio units (ref 0.0–4.4)
Cholesterol, Total: 222 mg/dL — ABNORMAL HIGH (ref 100–199)
HDL: 60 mg/dL (ref >39)
LDL Calculated: 127 mg/dL — ABNORMAL HIGH (ref 0–99)
Triglycerides: 173 mg/dL — ABNORMAL HIGH (ref 0–149)
VLDL Cholesterol Cal: 35 mg/dL (ref 5–40)

## 2016-04-27 LAB — VITAMIN D 25 HYDROXY (VIT D DEFICIENCY, FRACTURES): Vit D, 25-Hydroxy: 21.5 ng/mL — ABNORMAL LOW (ref 30.0–100.0)

## 2016-04-27 MED ORDER — VITAMIN D (ERGOCALCIFEROL) 1.25 MG (50000 UNIT) PO CAPS: 50000.0000 [IU] | ORAL_CAPSULE | ORAL | 3 refills | Status: DC

## 2016-04-27 NOTE — Telephone Encounter (Signed)
Seen in clinic 7/28 for heart burn/reflux. Was avoiding tomatos, GER-inducing foods until 4 days ago, had tomato sauce later than usual then tomatoes on a BLT. That night started feeling nauseous, threw up a few times over course of 2 hours. Since then has been eating bland foods and feeling better. Came in yesterday for labs due to second event. Slightly elevated bilirubin, otherwise normal. Pt says she has been tol din the past that both she and her son have slightly elevated bilirubin levels. Other levels in system from past 2 years were normal on chart review. Normal WBC. No abdominal pain, even during vomiting episode. Feeling fine now. Pt hasnt started taking H2-blocker yet for reflux. Will start that. Low threshold for abd ultrasound to eval gallbladder.

## 2016-04-27 NOTE — Telephone Encounter (Signed)
Please review and advise.

## 2016-05-04 ENCOUNTER — Telehealth: Payer: Self-pay | Admitting: Pediatrics

## 2016-05-04 ENCOUNTER — Telehealth: Payer: Self-pay | Admitting: Obstetrics & Gynecology

## 2016-05-04 NOTE — Telephone Encounter (Signed)
Pt notified of results Verbalizes understanding 

## 2016-05-04 NOTE — Telephone Encounter (Signed)
Patient is wanting to find out from Dr Sabra Heck should she continue wearing the estrogen patch.

## 2016-05-04 NOTE — Telephone Encounter (Signed)
Spoke with patient. Patient reports she was recently seen with her PCP for evaluation of chest burning and vomiting. Reports she had an echocardiogram which was normal. "She said my heart was very healthy." Reports symptoms were determined to be from "ffood poisoning." While being assessed for these symptoms her PCP asked why she was on an Estrogen patch. Reports her PCP told her she would not be on this and that she should start cutting her patch in half. Patient has continued to wear full patch at 0.0375 mg which she changes two times weekly. Patient would like Dr.Miller's advice on continuing her estrogen patch. "I feel comfortable continuing it, but I want Dr.Miller's opinion." Advised I will speak with Dr.Miller and return call with further recommendations. She is agreeable.

## 2016-05-05 MED ORDER — VIVELLE-DOT 0.025 MG/24HR TD PTTW: 1.0000 | MEDICATED_PATCH | TRANSDERMAL | 11 refills | Status: DC

## 2016-05-05 MED FILL — VIVELLE-DOT 0.025 MG PATCH: 0.025 | 28 days supply | Qty: 8 | Fill #0

## 2016-05-05 NOTE — Telephone Encounter (Signed)
Spoke with patient. Advised of message as seen below from [place]. She is agreeable and verbalizes understanding. Rx for Vivelle Dot 0.025 mg patch place 1 patch twice weekly #[id number] until patient's aex on 04/10/2017 sent to pharmacy on file. Patient is agreeable.  Routing to provider for final review. Patient agreeable to disposition. Will close encounter.

## 2016-05-05 NOTE — Telephone Encounter (Signed)
I would decrease to 0.025 mg patch first before cutting in 1/2.  Ok to change dosage and sent to pharmacy.  We can discuss decreasing further at her next AEX.

## 2016-05-29 ENCOUNTER — Other Ambulatory Visit: Payer: BLUE CROSS/BLUE SHIELD

## 2016-05-29 DIAGNOSIS — Z1211 Encounter for screening for malignant neoplasm of colon: Secondary | ICD-10-CM

## 2016-05-29 NOTE — Addendum Note (Signed)
Addended by: Liliane Bade on: 05/29/2016 03:55 PM   Modules accepted: Orders

## 2016-06-01 LAB — FECAL OCCULT BLOOD, IMMUNOCHEMICAL: Fecal Occult Bld: NEGATIVE

## 2016-06-09 MED FILL — VIVELLE-DOT 0.025 MG PATCH: 0.025 | 28 days supply | Qty: 8 | Fill #1

## 2016-06-12 ENCOUNTER — Telehealth: Payer: Self-pay | Admitting: Family Medicine

## 2016-06-12 NOTE — Telephone Encounter (Signed)
Patient aware of Vaccines.

## 2016-06-26 ENCOUNTER — Ambulatory Visit: Payer: BLUE CROSS/BLUE SHIELD

## 2016-06-28 ENCOUNTER — Ambulatory Visit: Payer: BLUE CROSS/BLUE SHIELD

## 2016-07-31 ENCOUNTER — Ambulatory Visit (INDEPENDENT_AMBULATORY_CARE_PROVIDER_SITE_OTHER): Payer: Medicare HMO | Admitting: *Deleted

## 2016-07-31 DIAGNOSIS — R69 Illness, unspecified: Secondary | ICD-10-CM | POA: Diagnosis not present

## 2016-07-31 DIAGNOSIS — Z23 Encounter for immunization: Secondary | ICD-10-CM

## 2016-08-23 DIAGNOSIS — H43812 Vitreous degeneration, left eye: Secondary | ICD-10-CM | POA: Diagnosis not present

## 2016-09-21 DIAGNOSIS — L308 Other specified dermatitis: Secondary | ICD-10-CM | POA: Diagnosis not present

## 2016-09-21 DIAGNOSIS — L820 Inflamed seborrheic keratosis: Secondary | ICD-10-CM | POA: Diagnosis not present

## 2016-09-29 DIAGNOSIS — M5136 Other intervertebral disc degeneration, lumbar region: Secondary | ICD-10-CM | POA: Diagnosis not present

## 2016-09-29 DIAGNOSIS — M1612 Unilateral primary osteoarthritis, left hip: Secondary | ICD-10-CM | POA: Diagnosis not present

## 2016-10-23 DIAGNOSIS — H524 Presbyopia: Secondary | ICD-10-CM | POA: Diagnosis not present

## 2016-10-23 DIAGNOSIS — H43813 Vitreous degeneration, bilateral: Secondary | ICD-10-CM | POA: Diagnosis not present

## 2016-10-23 DIAGNOSIS — H35411 Lattice degeneration of retina, right eye: Secondary | ICD-10-CM | POA: Diagnosis not present

## 2016-10-23 DIAGNOSIS — D3132 Benign neoplasm of left choroid: Secondary | ICD-10-CM | POA: Diagnosis not present

## 2016-12-18 ENCOUNTER — Other Ambulatory Visit: Payer: Self-pay

## 2016-12-18 ENCOUNTER — Other Ambulatory Visit: Payer: Self-pay | Admitting: Obstetrics & Gynecology

## 2016-12-18 DIAGNOSIS — Z1231 Encounter for screening mammogram for malignant neoplasm of breast: Secondary | ICD-10-CM

## 2016-12-25 ENCOUNTER — Other Ambulatory Visit: Payer: Medicare HMO

## 2016-12-25 DIAGNOSIS — E559 Vitamin D deficiency, unspecified: Secondary | ICD-10-CM

## 2016-12-25 DIAGNOSIS — E7800 Pure hypercholesterolemia, unspecified: Secondary | ICD-10-CM | POA: Diagnosis not present

## 2016-12-25 DIAGNOSIS — Z0000 Encounter for general adult medical examination without abnormal findings: Secondary | ICD-10-CM

## 2016-12-26 ENCOUNTER — Telehealth: Payer: Self-pay

## 2016-12-26 LAB — HEPATIC FUNCTION PANEL
ALT: 13 IU/L (ref 0–32)
AST: 18 IU/L (ref 0–40)
Albumin: 4.5 g/dL (ref 3.6–4.8)
Alkaline Phosphatase: 68 IU/L (ref 39–117)
Bilirubin Total: 1 mg/dL (ref 0.0–1.2)
Bilirubin, Direct: 0.2 mg/dL (ref 0.00–0.40)
Total Protein: 6.7 g/dL (ref 6.0–8.5)

## 2016-12-26 LAB — CBC WITH DIFFERENTIAL/PLATELET
Basophils Absolute: 0 10*3/uL (ref 0.0–0.2)
Basos: 1 %
EOS (ABSOLUTE): 0.1 10*3/uL (ref 0.0–0.4)
Eos: 2 %
Hematocrit: 39.5 % (ref 34.0–46.6)
Hemoglobin: 13.3 g/dL (ref 11.1–15.9)
Immature Grans (Abs): 0 10*3/uL (ref 0.0–0.1)
Immature Granulocytes: 0 %
Lymphocytes Absolute: 1.4 10*3/uL (ref 0.7–3.1)
Lymphs: 44 %
MCH: 32 pg (ref 26.6–33.0)
MCHC: 33.7 g/dL (ref 31.5–35.7)
MCV: 95 fL (ref 79–97)
Monocytes Absolute: 0.3 10*3/uL (ref 0.1–0.9)
Monocytes: 8 %
Neutrophils Absolute: 1.4 10*3/uL (ref 1.4–7.0)
Neutrophils: 45 %
Platelets: 182 10*3/uL (ref 150–379)
RBC: 4.15 x10E6/uL (ref 3.77–5.28)
RDW: 13 % (ref 12.3–15.4)
WBC: 3.2 10*3/uL — ABNORMAL LOW (ref 3.4–10.8)

## 2016-12-26 LAB — LIPID PANEL
Chol/HDL Ratio: 3.2 ratio (ref 0.0–4.4)
Cholesterol, Total: 204 mg/dL — ABNORMAL HIGH (ref 100–199)
HDL: 63 mg/dL (ref >39)
LDL Calculated: 112 mg/dL — ABNORMAL HIGH (ref 0–99)
Triglycerides: 147 mg/dL (ref 0–149)
VLDL Cholesterol Cal: 29 mg/dL (ref 5–40)

## 2016-12-26 LAB — THYROID PANEL WITH TSH
Free Thyroxine Index: 2.2 (ref 1.2–4.9)
T3 Uptake Ratio: 26 % (ref 24–39)
T4, Total: 8.6 ug/dL (ref 4.5–12.0)
TSH: 4.02 u[IU]/mL (ref 0.450–4.500)

## 2016-12-26 LAB — BMP8+EGFR
BUN/Creatinine Ratio: 15 (ref 12–28)
BUN: 12 mg/dL (ref 8–27)
CO2: 22 mmol/L (ref 18–29)
Calcium: 9.1 mg/dL (ref 8.7–10.3)
Chloride: 102 mmol/L (ref 96–106)
Creatinine, Ser: 0.8 mg/dL (ref 0.57–1.00)
GFR calc Af Amer: 89 mL/min/{1.73_m2} (ref >59)
GFR calc non Af Amer: 78 mL/min/{1.73_m2} (ref >59)
Glucose: 109 mg/dL — ABNORMAL HIGH (ref 65–99)
Potassium: 4.1 mmol/L (ref 3.5–5.2)
Sodium: 142 mmol/L (ref 134–144)

## 2016-12-26 LAB — VITAMIN D 25 HYDROXY (VIT D DEFICIENCY, FRACTURES): Vit D, 25-Hydroxy: 24.1 ng/mL — ABNORMAL LOW (ref 30.0–100.0)

## 2016-12-26 NOTE — Telephone Encounter (Addendum)
Left message to call [place] at [phone].  Need to inform patient that PA for Vivelle Dot has been approved by [employer] through 09/17/2017.

## 2016-12-26 NOTE — Telephone Encounter (Signed)
Spoke with patient. Advised of PA approval for Vivelle Dot. Patient verbalizes understanding. Requests I contact pharmacy to notify them of approval so this can be filled. Also requesting her appointment for aex be moved forward. Patient's last aex was on 12/20/2015. Aex appointment moved to 01/15/2017 at 1 pm with Dr.Miller. Patient is agreeable to date and time. I have notified Emelle on PA approval.  Routing to provider for final review. Patient agreeable to disposition. Will close encounter.

## 2016-12-27 ENCOUNTER — Ambulatory Visit (INDEPENDENT_AMBULATORY_CARE_PROVIDER_SITE_OTHER): Payer: Medicare HMO

## 2016-12-27 ENCOUNTER — Ambulatory Visit (INDEPENDENT_AMBULATORY_CARE_PROVIDER_SITE_OTHER): Payer: Medicare HMO | Admitting: Family Medicine

## 2016-12-27 ENCOUNTER — Encounter: Payer: Self-pay | Admitting: Family Medicine

## 2016-12-27 VITALS — BP 117/67 | HR 76 | Temp 98.2°F | Ht 64.25 in | Wt 164.0 lb

## 2016-12-27 DIAGNOSIS — E7800 Pure hypercholesterolemia, unspecified: Secondary | ICD-10-CM

## 2016-12-27 DIAGNOSIS — E559 Vitamin D deficiency, unspecified: Secondary | ICD-10-CM | POA: Diagnosis not present

## 2016-12-27 DIAGNOSIS — Z0000 Encounter for general adult medical examination without abnormal findings: Secondary | ICD-10-CM

## 2016-12-27 DIAGNOSIS — Z23 Encounter for immunization: Secondary | ICD-10-CM

## 2016-12-27 DIAGNOSIS — R300 Dysuria: Secondary | ICD-10-CM

## 2016-12-27 LAB — MICROSCOPIC EXAMINATION
Bacteria, UA: NONE SEEN
Renal Epithel, UA: NONE SEEN /hpf

## 2016-12-27 LAB — URINALYSIS, COMPLETE
Bilirubin, UA: NEGATIVE
Glucose, UA: NEGATIVE
Ketones, UA: NEGATIVE
Nitrite, UA: NEGATIVE
Protein, UA: NEGATIVE
Specific Gravity, UA: 1.025 (ref 1.005–1.030)
Urobilinogen, Ur: 0.2 mg/dL (ref 0.2–1.0)
pH, UA: 5 (ref 5.0–7.5)

## 2016-12-27 NOTE — Progress Notes (Signed)
Subjective:    Patient ID: Krista Henderson, female    DOB: [birth year], 66 y.o.   MRN: [id number]  HPI Pt here for follow up and management of chronic medical problems which includes hyperlipidemia. She is taking medication regularly.The patient does have a left hip bone spur and is seeing the orthopedist about this. Pressing a refill on her vitamin D and the whole family has vitamin D deficiency. The patient is aware that she's had her repeat colonoscopy in 2020. He is had lab work and we will review this during the visit today. Patient will get her Prevnar vaccine today. All thyroid tests were normal. The CBC had a white blood cell count that was slightly decreased and we will continue to monitor this. The hemoglobin was good at 13.3 and the platelet count was adequate. The blood sugar slightly elevated at 109 and this is been the case in the past. The creatinine, the most important kidney function test was within normal limits and the remainder of the electrolytes including potassium are good. The patient's vitamin D level remains low despite taking 50,000 units of vitamin D weekly. She should stay on the vitamin D. All liver function tests were normal. The total cholesterol is elevated at 204. Triglycerides were improved but still at the high end of normal range at 147. The LDL C cholesterol remains elevated at 112 and should be less than 100. The patient is doing well overall other than her left hip bone spur. She has seen orthopedic surgeon and this seems to be aggravated more when she is busy and active. He will continue to follow-up with her if she does not improve. The patient will be due to get a chest x-ray today. Her lab work has been reviewed with her. She is asking about the new shingles shot and it is okay as long as her insurance approves to get this and she should have a repeat inoculation 2-6 months after the first shot. She does not remember bringing an FOBT in and we will double check that.  She denies any chest pain shortness of breath trouble with her stomach including nausea vomiting diarrhea or blood in the stool. She is passing her water without problems. She will get her pelvic exam done on the 30th of this month and will get the mammogram done the same day. She is requesting a refill on her vitamin D.   Patient Active Problem List   Diagnosis Date Noted  . Hormone replacement therapy (HRT) 12/20/2015  . Shingles 05/11/2014  . Vitamin D deficiency 09/02/2013  . Hyperlipidemia 09/02/2013  . 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   Outpatient Encounter Prescriptions as of 12/27/2016  Medication Sig  . Calcium Carbonate-Vitamin D 600-400 MG-UNIT per tablet Take 1 tablet by mouth daily. With magnesium  . Vitamin D, Ergocalciferol, (DRISDOL) 50000 units CAPS capsule Take 1 capsule (50,000 Units total) by mouth once a week.  Marland Kitchen VIVELLE-DOT 0.025 MG/24HR Place 1 patch onto the skin 2 (two) times a week.   No facility-administered encounter medications on file as of 12/27/2016.      Review of Systems  Constitutional: Negative.   HENT: Negative.   Eyes: Negative.   Respiratory: Negative.   Cardiovascular: Negative.   Gastrointestinal: Negative.   Endocrine: Negative.   Genitourinary: Negative.   Musculoskeletal: Positive for arthralgias (left hip pain - seen [facility]).  Skin: Negative.   Allergic/Immunologic: Negative.   Neurological: Negative.   Hematological: Negative.   Psychiatric/Behavioral: Negative.  Objective:   Physical Exam  Constitutional: She is oriented to person, place, and time. She appears well-developed and well-nourished. No distress.  She is pleasant and alert.  HENT:  Head: Normocephalic and atraumatic.  Right Ear: External ear normal.  Left Ear: External ear normal.  Mouth/Throat: Oropharynx is clear and moist. No oropharyngeal exudate.  Some nasal congestion and turbinate swelling bilaterally  Eyes: Conjunctivae and EOM are normal. Pupils are equal,  round, and reactive to light. Right eye exhibits no discharge. Left eye exhibits no discharge. No scleral icterus.  Neck: Normal range of motion. Neck supple. No thyromegaly present.  No bruits thyromegaly or anterior cervical adenopathy  Cardiovascular: Normal rate, regular rhythm, normal heart sounds and intact distal pulses.   No murmur heard. Heart has a regular rate and rhythm at 72/m  Pulmonary/Chest: Effort normal and breath sounds normal. No respiratory distress. She has no wheezes. She has no rales.  Clear anteriorly and posteriorly  Abdominal: Soft. Bowel sounds are normal. She exhibits no mass. There is no tenderness. There is no rebound and no guarding.  No abdominal tenderness or organ enlargement or bruits or masses.  Musculoskeletal: Normal range of motion. She exhibits no edema.  Lymphadenopathy:    She has no cervical adenopathy.  Neurological: She is alert and oriented to person, place, and time. She has normal reflexes. No cranial nerve deficit.  Skin: Skin is warm and dry. No rash noted.  Psychiatric: She has a normal mood and affect. Her behavior is normal. Judgment and thought content normal.  Nursing note and vitals reviewed.  BP 117/67 (BP Location: Left Arm)   Pulse 76   Temp 98.2 F (36.8 C) (Oral)   Ht 5' 4.25" (1.632 m)   Wt 164 lb (74.4 kg)   LMP 08/18/1989   BMI 27.93 kg/m   Chest x-ray with results pending====      Assessment & Plan:  1. Vitamin D deficiency -Continue with vitamin D 50,000 units weekly  2. Pure hypercholesterolemia -Continue with aggressive therapeutic lifestyle changes continually working on carb intake and exercise  3. Health care maintenance -Prevnar vaccine  Patient Instructions                       Medicare Annual Wellness Visit  [facility] and the medical providers at [facility] strive to bring you the best medical care.  In doing so we not only want to address your current medical conditions  and concerns but also to detect new conditions early and prevent illness, disease and health-related problems.    Medicare offers a yearly Wellness Visit which allows our clinical staff to assess your need for preventative services including immunizations, lifestyle education, counseling to decrease risk of preventable diseases and screening for fall risk and other medical concerns.    This visit is provided free of charge (no copay) for all Medicare recipients. The clinical pharmacists at [facility] have begun to conduct these Wellness Visits which will also include a thorough review of all your medications.    As you primary medical provider recommend that you make an appointment for your Annual Wellness Visit if you have not done so already this year.  You may set up this appointment before you leave today or you may call back ([phone]) and schedule an appointment.  Please make sure when you call that you mention that you are scheduling your Annual Wellness Visit with the clinical pharmacist so that  the appointment may be made for the proper length of time.     Continue current medications. Continue good therapeutic lifestyle changes which include good diet and exercise. Fall precautions discussed with patient. If an FOBT was given today- please return it to our front desk. If you are over 3 years old - you may need Prevnar 25 or the adult Pneumonia vaccine.  **Flu shots are available--- please call and schedule a FLU-CLINIC appointment**  After your visit with [place] today you will receive a survey in the mail or online from [employer] regarding your care with [place]. Please take a moment to fill this out. Your feedback is very important to [place] as you can help [place] better understand your patient needs as well as improve your experience and satisfaction. WE CARE ABOUT YOU!!!   Continue to follow up yearly for dermatology checks Get mammogram as planned Get pelvic exam and Pap  smear as planned and make sure that we get a copy of that report Get eye exam at least every 2 years Continue to follow-up aggressive therapeutic lifestyle changes and push the sugar weight and carbs away as much as possible Check back with [place] and we will give you the new series of shingles shot vaccines.     Arrie Senate MD

## 2016-12-27 NOTE — Patient Instructions (Addendum)
Medicare Annual Wellness Visit  [facility] and the medical providers at [facility] strive to bring you the best medical care.  In doing so we not only want to address your current medical conditions and concerns but also to detect new conditions early and prevent illness, disease and health-related problems.    Medicare offers a yearly Wellness Visit which allows our clinical staff to assess your need for preventative services including immunizations, lifestyle education, counseling to decrease risk of preventable diseases and screening for fall risk and other medical concerns.    This visit is provided free of charge (no copay) for all Medicare recipients. The clinical pharmacists at [facility] have begun to conduct these Wellness Visits which will also include a thorough review of all your medications.    As you primary medical provider recommend that you make an appointment for your Annual Wellness Visit if you have not done so already this year.  You may set up this appointment before you leave today or you may call back ([phone]) and schedule an appointment.  Please make sure when you call that you mention that you are scheduling your Annual Wellness Visit with the clinical pharmacist so that the appointment may be made for the proper length of time.     Continue current medications. Continue good therapeutic lifestyle changes which include good diet and exercise. Fall precautions discussed with patient. If an FOBT was given today- please return it to our front desk. If you are over 66 years old - you may need Prevnar 6 or the adult Pneumonia vaccine.  **Flu shots are available--- please call and schedule a FLU-CLINIC appointment**  After your visit with [place] today you will receive a survey in the mail or online from [employer] regarding your care with [place]. Please take a moment to fill this out. Your feedback is very  important to [place] as you can help [place] better understand your patient needs as well as improve your experience and satisfaction. WE CARE ABOUT YOU!!!   Continue to follow up yearly for dermatology checks Get mammogram as planned Get pelvic exam and Pap smear as planned and make sure that we get a copy of that report Get eye exam at least every 2 years Continue to follow-up aggressive therapeutic lifestyle changes and push the sugar weight and carbs away as much as possible Check back with [place] and we will give you the new series of shingles shot vaccines.

## 2016-12-27 NOTE — Addendum Note (Signed)
Addended by: Zannie Cove on: 12/27/2016 05:23 PM   Modules accepted: Orders

## 2016-12-27 NOTE — Addendum Note (Signed)
Addended by: Zannie Cove on: 12/27/2016 03:55 PM   Modules accepted: Orders

## 2016-12-28 NOTE — Addendum Note (Signed)
Addended by: Zannie Cove on: 12/28/2016 10:26 AM   Modules accepted: Orders

## 2016-12-29 LAB — URINE CULTURE: Organism ID, Bacteria: NO GROWTH

## 2017-01-01 ENCOUNTER — Ambulatory Visit: Payer: BLUE CROSS/BLUE SHIELD

## 2017-01-11 ENCOUNTER — Telehealth: Payer: Self-pay | Admitting: *Deleted

## 2017-01-11 NOTE — Telephone Encounter (Signed)
Wont hurt anything

## 2017-01-11 NOTE — Telephone Encounter (Signed)
Patient aware.

## 2017-01-15 ENCOUNTER — Ambulatory Visit (INDEPENDENT_AMBULATORY_CARE_PROVIDER_SITE_OTHER): Payer: Medicare HMO | Admitting: Obstetrics & Gynecology

## 2017-01-15 ENCOUNTER — Ambulatory Visit: Payer: BLUE CROSS/BLUE SHIELD

## 2017-01-15 ENCOUNTER — Encounter: Payer: Self-pay | Admitting: Obstetrics & Gynecology

## 2017-01-15 ENCOUNTER — Ambulatory Visit
Admission: RE | Admit: 2017-01-15 | Discharge: 2017-01-15 | Disposition: A | Discharge status: Home / Self Care | Payer: Medicare HMO | Source: Ambulatory Visit | Attending: Obstetrics & Gynecology | Admitting: Obstetrics & Gynecology

## 2017-01-15 VITALS — BP 118/76 | HR 72 | Resp 16 | Ht 64.25 in | Wt 163.8 lb

## 2017-01-15 DIAGNOSIS — L570 Actinic keratosis: Secondary | ICD-10-CM | POA: Diagnosis not present

## 2017-01-15 DIAGNOSIS — Z1231 Encounter for screening mammogram for malignant neoplasm of breast: Secondary | ICD-10-CM

## 2017-01-15 DIAGNOSIS — X32XXXD Exposure to sunlight, subsequent encounter: Secondary | ICD-10-CM | POA: Diagnosis not present

## 2017-01-15 DIAGNOSIS — Z01419 Encounter for gynecological examination (general) (routine) without abnormal findings: Secondary | ICD-10-CM | POA: Diagnosis not present

## 2017-01-15 DIAGNOSIS — Z1283 Encounter for screening for malignant neoplasm of skin: Secondary | ICD-10-CM | POA: Diagnosis not present

## 2017-01-15 DIAGNOSIS — L821 Other seborrheic keratosis: Secondary | ICD-10-CM | POA: Diagnosis not present

## 2017-01-15 DIAGNOSIS — L820 Inflamed seborrheic keratosis: Secondary | ICD-10-CM | POA: Diagnosis not present

## 2017-01-15 MED ORDER — ESTRADIOL 0.025 MG/24HR TD PTTW: 1.0000 | MEDICATED_PATCH | TRANSDERMAL | 4 refills | Status: DC

## 2017-01-15 NOTE — Progress Notes (Signed)
66 y.o. [id number] MarriedCaucasianF here for annual exam.  States she is doing well except having some left hip pain.  She has a bone spur on her left hip.  Saw Dr. Maureen Ralphs.  Does have follow-up scheduled.  Reports she stopped her HRT a couple of months ago but had hot flashes so started back.  Was able to wean down to 0.025mg  patches.  Planning to try and stop at end of the summer.  Patient's last menstrual period was 08/18/1989.          Sexually active: Yes.    The current method of family planning is status post hysterectomy.    Exercising: Yes.    Walking Smoker:  no  Health Maintenance: Pap:  10/2000 Normal  History of abnormal Pap:  yes MMG:  12/22/15 BIRADS1, D[facility] B[facility] (Scheduled for today) Colonoscopy:  10/13/13.  Repeat 5 years  BMD:  12/21/15 normal, Dr. Laurance Flatten.  Normal per pt. TDaP:  05/2011 Pneumonia:  Prevnar 12/27/16 Screening Labs: PCP Hep C:  Done with PCP, 2012   reports that she has never smoked. She has never used smokeless tobacco. She reports that she does not drink alcohol or use drugs.  Past Medical History:  Diagnosis Date  . Bone spur    Left hip  . Endometriosis   . Hormone replacement therapy   . Hyperlipidemia   . 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   WBC 3,000 08/30/11 52 P 43 L  . Migraine without aura   . Vitamin D deficiency     Past Surgical History:  Procedure Laterality Date  . abdominal laporoscopic surgery    . APPENDECTOMY  1965   7th grade in the '60's  . CESAREAN SECTION     x2 '84 & '85  . TOTAL ABDOMINAL HYSTERECTOMY W/ BILATERAL SALPINGOOPHORECTOMY  08/1989   endometriosis    Current Outpatient Prescriptions  Medication Sig Dispense Refill  . Calcium Carbonate-Vitamin D 600-400 MG-UNIT per tablet Take 1 tablet by mouth daily. With magnesium    . Vitamin D, Ergocalciferol, (DRISDOL) 50000 units CAPS capsule Take 1 capsule (50,000 Units total) by mouth once a week. 12 capsule 3  . vitamin E 100 UNIT capsule Take by mouth daily.    Marland Kitchen  VIVELLE-DOT 0.025 MG/24HR Place 1 patch onto the skin 2 (two) times a week. 8 patch 11   No current facility-administered medications for this visit.     Family History  Problem Relation Age of Onset  . Diabetes Mother   . Osteoporosis Mother   . Hypertension Mother   . Colon cancer Father 27  . Stomach cancer Paternal Grandfather   . Breast cancer Paternal Aunt     6's  . Esophageal cancer Neg Hx   . Rectal cancer Neg Hx     ROS:  Pertinent items are noted in HPI.  Otherwise, a comprehensive ROS was negative.  Exam:   BP 118/76 (BP Location: Right Arm, Patient Position: Sitting, Cuff Size: Normal)   Pulse 72   Resp 16   Ht 5' 4.25" (1.632 m)   Wt 163 lb 12.8 oz (74.3 kg)   LMP 08/18/1989   BMI 27.90 kg/m   Weight change: +4#  Height: 5' 4.25" (163.2 cm)  Ht Readings from Last 3 Encounters:  01/15/17 5' 4.25" (1.632 m)  12/27/16 5' 4.25" (1.632 m)  04/14/16 5' 4.25" (1.632 m)    General appearance: alert, cooperative and appears stated age Head: Normocephalic, without obvious abnormality, atraumatic Neck: no adenopathy, supple,  symmetrical, trachea midline and thyroid normal to inspection and palpation Lungs: clear to auscultation bilaterally Breasts: normal appearance, no masses or tenderness Heart: regular rate and rhythm Abdomen: soft, non-tender; bowel sounds normal; no masses,  no organomegaly Extremities: extremities normal, atraumatic, no cyanosis or edema Skin: Skin color, texture, turgor normal. No rashes or lesions Lymph nodes: Cervical, supraclavicular, and axillary nodes normal. No abnormal inguinal nodes palpated Neurologic: Grossly normal   Pelvic: External genitalia:  no lesions              Urethra:  normal appearing urethra with no masses, tenderness or lesions              Bartholins and Skenes: normal                 Vagina: normal appearing vagina with normal color and discharge, no lesions              Cervix: absent              Pap taken:  No. Bimanual Exam:  Uterus:  uterus absent              Adnexa: no mass, fullness, tenderness               Rectovaginal: Confirms               Anus:  normal sphincter tone, no lesions  Chaperone was present for exam.  A:  Well Woman with normal exam s/p TAH?BSO secondary to endometriosis 12/90 PMP, on low dosed HRT Vit D deficiency H/o shingles, desires to have new vaccine  P:   Mammogram guidelines reviewed.  D/W pt 3D MMG. pap smear not indicated Rx for vivelle dot 0.025mg  patches twice weekly (generic ok) to pt.  She is going to try and stop for good after the summer is over.  #12/4RF.  Reviewed risks and benefits again today specifically breast cancer, MI, stroke, DVT, PE.  Voices clear understanding. Order for shingles vaccine given Labs and other vaccines UTD return annually or prn

## 2017-01-18 ENCOUNTER — Telehealth: Payer: Self-pay | Admitting: Family Medicine

## 2017-01-18 NOTE — Telephone Encounter (Signed)
Patient states that she has been having lower back pain since 4/19 after riding the lawn mower for a long time. Patient states when she is sitting down and cough's she has the lower back pain. When just sitting states she some times has pain but not like it is when coughing. Also states when standing and coughing there is no lower back pain. Wanted to ask DWM what she should do and also if she should keep her Ortho apt for her hip on 02/23/17? Please advise

## 2017-01-18 NOTE — Telephone Encounter (Signed)
The patient probably needs to come in and get some x-rays of her back and take some anti-inflammatory medicine for this to see if it can be improved. She also may need a urinalysis. She should definitely keep her appointment with orthopedist.

## 2017-01-22 NOTE — Telephone Encounter (Signed)
Lm to see if she can come in to be seen

## 2017-01-26 ENCOUNTER — Encounter: Payer: Self-pay | Admitting: Family

## 2017-01-26 ENCOUNTER — Telehealth: Payer: Self-pay | Admitting: Family Medicine

## 2017-01-26 ENCOUNTER — Ambulatory Visit (INDEPENDENT_AMBULATORY_CARE_PROVIDER_SITE_OTHER): Payer: Medicare HMO | Admitting: Family

## 2017-01-26 ENCOUNTER — Ambulatory Visit: Payer: Medicare HMO | Admitting: Family

## 2017-01-26 VITALS — BP 124/72 | HR 78 | Temp 97.2°F | Ht 64.25 in | Wt 163.2 lb

## 2017-01-26 DIAGNOSIS — M545 Low back pain, unspecified: Secondary | ICD-10-CM

## 2017-01-26 MED ORDER — KETOROLAC TROMETHAMINE 60 MG/2ML IM SOLN
30.0000 mg | Freq: Once | INTRAMUSCULAR | Status: AC
  Administered 2017-01-26: 30 mg via INTRAMUSCULAR

## 2017-01-26 MED ORDER — NAPROXEN 500 MG PO TABS: 500.0000 mg | ORAL_TABLET | Freq: Two times a day (BID) | ORAL | 0 refills | Status: DC

## 2017-01-26 MED ORDER — METHYLPREDNISOLONE ACETATE 80 MG/ML IJ SUSP
80.0000 mg | Freq: Once | INTRAMUSCULAR | Status: AC
  Administered 2017-01-26: 80 mg via INTRAMUSCULAR

## 2017-01-26 NOTE — Addendum Note (Signed)
Addended by: Evelina Dun A on: 01/26/2017 03:05 PM   Modules accepted: Orders

## 2017-01-26 NOTE — Patient Instructions (Addendum)
Back Pain, Adult Back pain is very common in adults.The cause of back pain is rarely dangerous and the pain often gets better over time.The cause of your back pain may not be known. Some common causes of back pain include:  Strain of the muscles or ligaments supporting the spine.  Wear and tear (degeneration) of the spinal disks.  Arthritis.  Direct injury to the back. For many people, back pain may return. Since back pain is rarely dangerous, most people can learn to manage this condition on their own. Follow these instructions at home: Watch your back pain for any changes. The following actions may help to lessen any discomfort you are feeling:  Remain active. It is stressful on your back to sit or stand in one place for long periods of time. Do not sit, drive, or stand in one place for more than 30 minutes at a time. Take short walks on even surfaces as soon as you are able.Try to increase the length of time you walk each day.  Exercise regularly as directed by your health care provider. Exercise helps your back heal faster. It also helps avoid future injury by keeping your muscles strong and flexible.  Do not stay in bed.Resting more than 1-2 days can delay your recovery.  Pay attention to your body when you bend and lift. The most comfortable positions are those that put less stress on your recovering back. Always use proper lifting techniques, including:  Bending your knees.  Keeping the load close to your body.  Avoiding twisting.  Find a comfortable position to sleep. Use a firm mattress and lie on your side with your knees slightly bent. If you lie on your back, put a pillow under your knees.  Avoid feeling anxious or stressed.Stress increases muscle tension and can worsen back pain.It is important to recognize when you are anxious or stressed and learn ways to manage it, such as with exercise.  Take medicines only as directed by your health care provider.  Over-the-counter medicines to reduce pain and inflammation are often the most helpful.Your health care provider may prescribe muscle relaxant drugs.These medicines help dull your pain so you can more quickly return to your normal activities and healthy exercise.  Apply ice to the injured area:  Put ice in a plastic bag.  Place a towel between your skin and the bag.  Leave the ice on for 20 minutes, 2-3 times a day for the first 2-3 days. After that, ice and heat may be alternated to reduce pain and spasms.  Maintain a healthy weight. Excess weight puts extra stress on your back and makes it difficult to maintain good posture. Contact a health care provider if:  You have pain that is not relieved with rest or medicine.  You have increasing pain going down into the legs or buttocks.  You have pain that does not improve in one week.  You have night pain.  You lose weight.  You have a fever or chills. Get help right away if:  You develop new bowel or bladder control problems.  You have unusual weakness or numbness in your arms or legs.  You develop nausea or vomiting.  You develop abdominal pain.  You feel faint. This information is not intended to replace advice given to you by your health care provider. Make sure you discuss any questions you have with your health care provider. Document Released: 09/04/2005 Document Revised: 01/13/2016 Document Reviewed: 01/06/2014 Elsevier Interactive Patient Education  2017 Elsevier   [place] Ask your health care provider which exercises are safe for you. Do exercises exactly as told by your health care provider and adjust them as directed. It is normal to feel mild stretching, pulling, tightness, or discomfort as you do these exercises, but you should stop right away if you feel sudden pain or your pain gets worse. Do not begin these exercises until told by your health care provider. Stretching and range of motion  exercises These exercises warm up your muscles and joints and improve the movement and flexibility of your back. These exercises also help to relieve pain, numbness, and tingling. Exercise A: Lumbar rotation   1. Lie on your back on a firm surface and bend your knees. 2. Straighten your arms out to your sides so each arm forms an "L" shape with a side of your body (a 90 degree angle). 3. Slowly move both of your knees to one side of your body until you feel a stretch in your lower back. Try not to let your shoulders move off of the floor. 4. Hold for __________ seconds. 5. Tense your abdominal muscles and slowly move your knees back to the starting position. 6. Repeat this exercise on the other side of your body. Repeat __________ times. Complete this exercise __________ times a day. Exercise B: Prone extension on elbows   1. Lie on your abdomen on a firm surface. 2. Prop yourself up on your elbows. 3. Use your arms to help lift your chest up until you feel a gentle stretch in your abdomen and your lower back.  This will place some of your body weight on your elbows. If this is uncomfortable, try stacking pillows under your chest.  Your hips should stay down, against the surface that you are lying on. Keep your hip and back muscles relaxed. 4. Hold for __________ seconds. 5. Slowly relax your upper body and return to the starting position. Repeat __________ times. Complete this exercise __________ times a day. Strengthening exercises These exercises build strength and endurance in your back. Endurance is the ability to use your muscles for a long time, even after they get tired. Exercise C: Pelvic tilt  1. Lie on your back on a firm surface. Bend your knees and keep your feet flat. 2. Tense your abdominal muscles. Tip your pelvis up toward the ceiling and flatten your lower back into the floor.  To help with this exercise, you may place a small towel under your lower back and try to push  your back into the towel. 3. Hold for __________ seconds. 4. Let your muscles relax completely before you repeat this exercise. Repeat __________ times. Complete this exercise __________ times a day. Exercise D: Alternating arm and leg raises   1. Get on your hands and knees on a firm surface. If you are on a hard floor, you may want to use padding to cushion your knees, such as an exercise mat. 2. Line up your arms and legs. Your hands should be below your shoulders, and your knees should be below your hips. 3. Lift your left leg behind you. At the same time, raise your right arm and straighten it in front of you.  Do not lift your leg higher than your hip.  Do not lift your arm higher than your shoulder.  Keep your abdominal and back muscles tight.  Keep your hips facing the ground.  Do not arch your back.  Keep your balance carefully, and do  not hold your breath. 4. Hold for __________ seconds. 5. Slowly return to the starting position and repeat with your right leg and your left arm. Repeat __________ times. Complete this exercise __________ times a day. Exercise E: Abdominal set with straight leg raise   1. Lie on your back on a firm surface. 2. Bend one of your knees and keep your other leg straight. 3. Tense your abdominal muscles and lift your straight leg up, 4-6 inches (10-15 cm) off the ground. 4. Keep your abdominal muscles tight and hold for __________ seconds.  Do not hold your breath.  Do not arch your back. Keep it flat against the ground. 5. Keep your abdominal muscles tense as you slowly lower your leg back to the starting position. 6. Repeat with your other leg. Repeat __________ times. Complete this exercise __________ times a day. Posture and body mechanics   Body mechanics refers to the movements and positions of your body while you do your daily activities. Posture is part of body mechanics. Good posture and healthy body mechanics can help to relieve  stress in your body's tissues and joints. Good posture means that your spine is in its natural S-curve position (your spine is neutral), your shoulders are pulled back slightly, and your head is not tipped forward. The following are general guidelines for applying improved posture and body mechanics to your everyday activities. Standing    When standing, keep your spine neutral and your feet about hip-width apart. Keep a slight bend in your knees. Your ears, shoulders, and hips should line up.  When you do a task in which you stand in one place for a long time, place one foot up on a stable object that is 2-4 inches (5-10 cm) high, such as a footstool. This helps keep your spine neutral. Sitting    When sitting, keep your spine neutral and keep your feet flat on the floor. Use a footrest, if necessary, and keep your thighs parallel to the floor. Avoid rounding your shoulders, and avoid tilting your head forward.  When working at a desk or a computer, keep your desk at a height where your hands are slightly lower than your elbows. Slide your chair under your desk so you are close enough to maintain good posture.  When working at a computer, place your monitor at a height where you are looking straight ahead and you do not have to tilt your head forward or downward to look at the screen. Resting    When lying down and resting, avoid positions that are most painful for you.  If you have pain with activities such as sitting, bending, stooping, or squatting (flexion-based activities), lie in a position in which your body does not bend very much. For example, avoid curling up on your side with your arms and knees near your chest (fetal position).  If you have pain with activities such as standing for a long time or reaching with your arms (extension-based activities), lie with your spine in a neutral position and bend your knees slightly. Try the following positions:  Lying on your side with a  pillow between your knees.  Lying on your back with a pillow under your knees. Lifting    When lifting objects, keep your feet at least shoulder-width apart and tighten your abdominal muscles.  Bend your knees and hips and keep your spine neutral. It is important to lift using the strength of your legs, not your back. Do not lock your  knees straight out.  Always ask for help to lift heavy or awkward objects. This information is not intended to replace advice given to you by your health care provider. Make sure you discuss any questions you have with your health care provider. Document Released: 09/04/2005 Document Revised: 05/11/2016 Document Reviewed: 06/16/2015 Elsevier Interactive Patient Education  2017 [employer].

## 2017-01-26 NOTE — Progress Notes (Signed)
   Subjective:    Patient ID: Krista Henderson, female    DOB: 12-30-1950, 66 y.o.   MRN: [id number]  Back Pain  This is a new problem. The current episode started more than 1 month ago. The problem occurs intermittently. The problem has been waxing and waning since onset. The pain is present in the lumbar spine. The quality of the pain is described as aching. The pain is at a severity of 8/10. The pain is mild. The symptoms are aggravated by sitting. Pertinent negatives include no bladder incontinence, bowel incontinence, dysuria or leg pain. Risk factors include obesity and menopause. She has tried bed rest, NSAIDs and ice for the symptoms. The treatment provided mild relief.      Review of Systems  Gastrointestinal: Negative for bowel incontinence.  Genitourinary: Negative for bladder incontinence and dysuria.  Musculoskeletal: Positive for back pain.  All other systems reviewed and are negative.      Objective:   Physical Exam  Constitutional: She is oriented to person, place, and time. She appears well-developed and well-nourished. No distress.  HENT:  Head: Normocephalic.  Eyes: Pupils are equal, round, and reactive to light.  Neck: Normal range of motion. Neck supple. No thyromegaly present.  Cardiovascular: Normal rate, regular rhythm, normal heart sounds and intact distal pulses.   No murmur heard. Pulmonary/Chest: Effort normal and breath sounds normal. No respiratory distress. She has no wheezes.  Abdominal: Soft. Bowel sounds are normal. She exhibits no distension. There is no tenderness.  Musculoskeletal: Normal range of motion. She exhibits no edema or tenderness.  Negative SLR   Neurological: She is alert and oriented to person, place, and time.  Skin: Skin is warm and dry.  Psychiatric: She has a normal mood and affect. Her behavior is normal. Judgment and thought content normal.  Vitals reviewed.     BP 124/72   Pulse 78   Temp 97.2 F (36.2 C) (Oral)   Ht  5' 4.25" (1.632 m)   Wt 163 lb 3.2 oz (74 kg)   LMP 08/18/1989   BMI 27.80 kg/m      Assessment & Plan:  1. Acute bilateral low back pain without sciatica Rest Ice and heat Take naproxen with food- No other NSAID's  ROM exercise discussed- handout given  RTO prn  - methylPREDNISolone acetate (DEPO-MEDROL) injection 80 mg; Inject 1 mL (80 mg total) into the muscle once. - ketorolac (TORADOL) injection 30 mg; Inject 1 mL (30 mg total) into the muscle once. - naproxen (NAPROSYN) 500 MG tablet; Take 1 tablet (500 mg total) by mouth 2 (two) times daily with a meal.  Dispense: 30 tablet; Refill: 0   Evelina Dun, FNP

## 2017-01-26 NOTE — Telephone Encounter (Signed)
Aware, it is her choice.

## 2017-02-05 ENCOUNTER — Ambulatory Visit (INDEPENDENT_AMBULATORY_CARE_PROVIDER_SITE_OTHER): Payer: Medicare HMO | Admitting: Pediatrics

## 2017-02-05 ENCOUNTER — Telehealth: Payer: Self-pay | Admitting: Family Medicine

## 2017-02-05 ENCOUNTER — Encounter: Payer: Self-pay | Admitting: Pediatrics

## 2017-02-05 VITALS — BP 132/75 | HR 64 | Temp 97.5°F | Ht 64.25 in | Wt 162.0 lb

## 2017-02-05 DIAGNOSIS — S80921A Unspecified superficial injury of right lower leg, initial encounter: Secondary | ICD-10-CM

## 2017-02-05 DIAGNOSIS — T148XXA Other injury of unspecified body region, initial encounter: Secondary | ICD-10-CM

## 2017-02-05 NOTE — Telephone Encounter (Signed)
What symptoms do you have? Knot on leg, part looks like a bruise part is sort of red sometimes throbs  How long have you been sick? Since Friday May 18  Have you been seen for this problem? No she was seen on Jan 26, 2017 and got two shots does not know name of them but wants to know if it could be associated with that  If your provider decides to give you a prescription, which pharmacy would you like for it to be sent to?[facility]   Patient informed that this information will be sent to the clinical staff for review and that they should receive a follow up call.

## 2017-02-05 NOTE — Telephone Encounter (Signed)
Pt aware needs appt. 

## 2017-02-05 NOTE — Progress Notes (Signed)
  Subjective:   Patient ID: Krista Henderson, female    DOB: [birth year], 66 y.o.   MRN: [id number] CC: right lower leg redness and bruising  HPI: Krista Henderson is a 66 y.o. female presenting for right lower leg redness and bruising  Two days ago woke up with bump on R ant shin Doesn't remember hitting or hurting it but says she might have No CP, no SOB, normal exercise tolerance No swelling in LE No h/o blood clot Not on estrogen replacement anymore, stopped apprx 2-3 months ago Past two nights has felt sweaty at night, had to change her shirt Normal appetite, exercise tolerance No leg pain sitting down Sometimes after standing and walking on it might ache some but is tolerable  Has been babysitting for 17mo granddaughter during the day for 3 weeks, having increasing back pain that has improved somewhat with recent steroid injections   Relevant past medical, surgical, family and social history reviewed. Allergies and medications reviewed and updated. History  Smoking Status  . Never Smoker  Smokeless Tobacco  . Never Used   ROS: Per HPI   Objective:    BP 132/75   Pulse 64   Temp 97.5 F (36.4 C) (Oral)   Ht 5' 4.25" (1.632 m)   Wt 162 lb (73.5 kg)   LMP 08/18/1989   BMI 27.59 kg/m   Wt Readings from Last 3 Encounters:  02/05/17 162 lb (73.5 kg)  01/26/17 163 lb 3.2 oz (74 kg)  01/15/17 163 lb 12.8 oz (74.3 kg)    Gen: NAD, alert, cooperative with exam, NCAT EYES: EOMI, no conjunctival injection, or no icterus CV: NRRR, normal S1/S2, no murmur, distal pulses 2+ b/l Resp: CTABL, no wheezes, normal WOB Ext: No edema, warm Neuro: Alert and oriented MSK: calves equal to inspection, no swelling or tightness Skin: ant prox R shin with 2 cm of induration with superficial telangiectasias present surrounded by several cm of slight purple bruising. Mildly ttp.  Assessment & Plan:  Jayra was seen today for right lower leg redness and bruising.  Diagnoses and all orders  for this visit:  Bruise Suspect shin was hit and pt doesn't remember No pain with weight bearing If not improving rtc Does not appear to be superficial thrombophlebitis  No swelling, leg pain concerning for DVT  Follow up plan: As needed Assunta Found, MD [place]

## 2017-03-01 ENCOUNTER — Telehealth: Payer: Self-pay | Admitting: Family Medicine

## 2017-03-01 DIAGNOSIS — R300 Dysuria: Secondary | ICD-10-CM

## 2017-03-01 DIAGNOSIS — M545 Low back pain: Secondary | ICD-10-CM

## 2017-03-01 NOTE — Telephone Encounter (Signed)
Aware she can come by and leave a sample

## 2017-03-02 ENCOUNTER — Telehealth: Payer: Self-pay | Admitting: Obstetrics & Gynecology

## 2017-03-02 ENCOUNTER — Other Ambulatory Visit: Payer: Medicare HMO

## 2017-03-02 DIAGNOSIS — M545 Low back pain: Secondary | ICD-10-CM

## 2017-03-02 DIAGNOSIS — R300 Dysuria: Secondary | ICD-10-CM | POA: Diagnosis not present

## 2017-03-02 LAB — MICROSCOPIC EXAMINATION: Renal Epithel, UA: NONE SEEN /hpf

## 2017-03-02 LAB — URINALYSIS, COMPLETE
Bilirubin, UA: NEGATIVE
Glucose, UA: NEGATIVE
Ketones, UA: NEGATIVE
Nitrite, UA: NEGATIVE
Protein, UA: NEGATIVE
Specific Gravity, UA: 1.015 (ref 1.005–1.030)
Urobilinogen, Ur: 0.2 mg/dL (ref 0.2–1.0)
pH, UA: 6 (ref 5.0–7.5)

## 2017-03-02 NOTE — Telephone Encounter (Signed)
Spoke with patient, patient scheduled for OV with Dr. Sabra Heck on 03/05/17 at 3:30pm. Patient is agreeable to date and time.  Routing to provider for final review. Patient is agreeable to disposition. Will close encounter.

## 2017-03-02 NOTE — Telephone Encounter (Signed)
Patient has been off estrogen patch for at least 2 months.  She has started having a lot of anxiety and night sweats.  Wants to speak with a nurse about going back on the patches.

## 2017-03-02 NOTE — Telephone Encounter (Signed)
Spoke with patient. Patient states she has been experiencing increased anxiety at night and feeling warm and flushed since Wednesday night. Reports heart races, takes a while to get back to sleep. Reports lower back pain. Patient states she was evaluated by pcp this morning, UA done, not aware of results. Patient states she feels like she needs to go back on HRT low dose as she was previously on. Patient states she discussed with Dr. Sabra Heck in April weaning off and she did, would like to restart, is this an option? Advised patient would review with Dr. Sabra Heck and return call with recommendations, patient is agreeable. AEX 01/15/17.  Dr. Sabra Heck -please review and advise?

## 2017-03-02 NOTE — Telephone Encounter (Signed)
Left message to call Jazzy Parmer at [phone].  

## 2017-03-02 NOTE — Telephone Encounter (Signed)
Needs office visit. Please schedule.

## 2017-03-05 ENCOUNTER — Ambulatory Visit (INDEPENDENT_AMBULATORY_CARE_PROVIDER_SITE_OTHER): Payer: Medicare HMO | Admitting: Obstetrics & Gynecology

## 2017-03-05 ENCOUNTER — Encounter: Payer: Self-pay | Admitting: Obstetrics & Gynecology

## 2017-03-05 VITALS — BP 132/76 | HR 80 | Temp 98.3°F | Resp 16 | Ht 64.25 in | Wt 161.0 lb

## 2017-03-05 DIAGNOSIS — N951 Menopausal and female climacteric states: Secondary | ICD-10-CM

## 2017-03-05 DIAGNOSIS — N390 Urinary tract infection, site not specified: Secondary | ICD-10-CM

## 2017-03-05 DIAGNOSIS — R232 Flushing: Secondary | ICD-10-CM | POA: Diagnosis not present

## 2017-03-05 DIAGNOSIS — B952 Enterococcus as the cause of diseases classified elsewhere: Secondary | ICD-10-CM

## 2017-03-05 LAB — URINE CULTURE

## 2017-03-05 MED ORDER — NITROFURANTOIN MONOHYD MACRO 100 MG PO CAPS: 100.0000 mg | ORAL_CAPSULE | Freq: Two times a day (BID) | ORAL | 0 refills | Status: DC

## 2017-03-05 MED ORDER — FLUCONAZOLE 150 MG PO TABS: 150.0000 mg | ORAL_TABLET | Freq: Once | ORAL | 0 refills | Status: AC

## 2017-03-05 NOTE — Progress Notes (Addendum)
GYNECOLOGY  VISIT   HPI: 66 y.o. G51P2002 Married Caucasian female here for complaint of increased hot flashes, night sweats and sensation of overall claminess for the last five to six days.  Reports she left a urine test at her PCP last week but does not know results yet.  Is having some increased urinary frequency and urgency as well.  Not sure if these two things are related.  She was able to stop her HRT earlier this year.  Was only on vivelle dot 0.025mg  patches at that time.  Has really not had any issues being off HRT until this past week.  Reviewed chart and advised pt that the urine cutlure is positive for enterococcus.  There is a note that she is to be called and treated with cipro.  This enterococcus is sensitive to several other antibiotics.  D/w pt risks and benefits of restarting HRT.  Alternative options including gabapentin discussed.  Given her current infection, would also recommend making sure this is resolved and see what symptoms are left before making decision.     GYNECOLOGIC HISTORY: Patient's last menstrual period was 08/18/1989. Contraception: hysterectomy  Menopausal hormone therapy: none  Patient Active Problem List   Diagnosis Date Noted  . Shingles 05/11/2014  . Vitamin D deficiency 09/02/2013  . Hyperlipidemia 09/02/2013  . 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    Past Medical History:  Diagnosis Date  . Bone spur    Left hip  . Endometriosis   . Hormone replacement therapy   . Hyperlipidemia   . 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   WBC 3,000 08/30/11 52 P 43 L  . Migraine without aura   . Vitamin D deficiency     Past Surgical History:  Procedure Laterality Date  . abdominal laporoscopic surgery    . APPENDECTOMY  1965   7th grade in the '60's  . CESAREAN SECTION     x2 '84 & '85  . TOTAL ABDOMINAL HYSTERECTOMY W/ BILATERAL SALPINGOOPHORECTOMY  08/1989   endometriosis    MEDS:  Reviewed in EPIC and UTD  ALLERGIES: Codeine and Influenza vaccines  Family History   Problem Relation Age of Onset  . Diabetes Mother   . Osteoporosis Mother   . Hypertension Mother   . Colon cancer Father 48  . Stomach cancer Paternal Grandfather   . Breast cancer Paternal Aunt        52's  . Esophageal cancer Neg Hx   . Rectal cancer Neg Hx     SH:  Married, non smoker  Review of Systems  Genitourinary: Positive for dysuria.  Musculoskeletal: Positive for myalgias.  Endo/Heme/Allergies: Bruises/bleeds easily.  All other systems reviewed and are negative.   PHYSICAL EXAMINATION:    BP 132/76 (BP Location: Right Arm, Patient Position: Sitting, Cuff Size: Normal)   Pulse 80   Temp 98.3 F (36.8 C) (Oral)   Resp 16   Ht 5' 4.25" (1.632 m)   Wt 161 lb (73 kg)   LMP 08/18/1989   BMI 27.42 kg/m     General appearance: alert, cooperative and appears stated age CV:  RRR with M/R/G Lungs:  CTA bilaterally Abdomen: soft, non-tender; bowel sounds normal; no masses,  no organomegaly Flank:  No CVA tenderness  Pelvic: not performed  Assessment: Recent onset of hot flashes and night sweats Stopped HRT in late April Enterococcus UTI  Plan: Recommend treatment with Macrobid 100mg  bid x 3. (Reveiwed culture and recommendations but would prefer to start with antibiotic without C diff risk.) Diflucan 150mg  po,  1 tab now and repeat in 72 hours. Consider Neurontin is symptoms do not fully resolve with antibiotics.  She will call with update.   ~15 minutes spent with patient >50% of time was in face to face discussion of above.

## 2017-03-14 ENCOUNTER — Other Ambulatory Visit: Payer: Medicare HMO

## 2017-03-14 DIAGNOSIS — Z87440 Personal history of urinary (tract) infections: Secondary | ICD-10-CM

## 2017-03-14 DIAGNOSIS — R69 Illness, unspecified: Secondary | ICD-10-CM | POA: Diagnosis not present

## 2017-03-14 LAB — URINALYSIS, COMPLETE
Bilirubin, UA: NEGATIVE
Glucose, UA: NEGATIVE
Ketones, UA: NEGATIVE
Leukocytes, UA: NEGATIVE
Nitrite, UA: NEGATIVE
Protein, UA: NEGATIVE
RBC, UA: NEGATIVE
Specific Gravity, UA: 1.02 (ref 1.005–1.030)
Urobilinogen, Ur: 0.2 mg/dL (ref 0.2–1.0)
pH, UA: 5.5 (ref 5.0–7.5)

## 2017-03-14 LAB — MICROSCOPIC EXAMINATION
Bacteria, UA: NONE SEEN
RBC, UA: NONE SEEN /hpf (ref 0–?)
Renal Epithel, UA: NONE SEEN /hpf

## 2017-03-16 LAB — URINE CULTURE

## 2017-03-17 ENCOUNTER — Other Ambulatory Visit: Payer: Self-pay | Admitting: Nurse Practitioner

## 2017-03-17 MED ORDER — SULFAMETHOXAZOLE-TRIMETHOPRIM 800-160 MG PO TABS: 1.0000 | ORAL_TABLET | Freq: Two times a day (BID) | ORAL | 0 refills | Status: DC

## 2017-03-19 ENCOUNTER — Telehealth: Payer: Self-pay | Admitting: Family Medicine

## 2017-03-19 MED ORDER — AMOXICILLIN-POT CLAVULANATE 875-125 MG PO TABS: 1.0000 | ORAL_TABLET | Freq: Two times a day (BID) | ORAL | 0 refills | Status: DC

## 2017-03-19 NOTE — Telephone Encounter (Signed)
Spoke with [facility] - med changed to aug 875 - ordered. Pt aware

## 2017-03-19 NOTE — Telephone Encounter (Signed)
Patient was got a Sulfa antibiotic on 6/30 and states since being on it she has been dizzy, jittery and sweaty. Wanting to know if something else can be called in.

## 2017-03-28 ENCOUNTER — Other Ambulatory Visit: Payer: Medicare HMO

## 2017-03-28 DIAGNOSIS — R8290 Unspecified abnormal findings in urine: Secondary | ICD-10-CM | POA: Diagnosis not present

## 2017-03-31 LAB — URINE CULTURE

## 2017-04-02 ENCOUNTER — Other Ambulatory Visit: Payer: Self-pay | Admitting: Family Medicine

## 2017-04-02 MED ORDER — CEPHALEXIN 500 MG PO CAPS: 500.0000 mg | ORAL_CAPSULE | Freq: Three times a day (TID) | ORAL | 0 refills | Status: DC

## 2017-04-06 ENCOUNTER — Telehealth: Payer: Self-pay | Admitting: Family Medicine

## 2017-04-06 ENCOUNTER — Telehealth: Payer: Self-pay | Admitting: Obstetrics & Gynecology

## 2017-04-06 ENCOUNTER — Other Ambulatory Visit: Payer: Self-pay | Admitting: *Deleted

## 2017-04-06 DIAGNOSIS — N3000 Acute cystitis without hematuria: Secondary | ICD-10-CM

## 2017-04-06 NOTE — Telephone Encounter (Signed)
Spoke to pt and she would like to take the Keflex and then come back in to leave another urine sample after completing the antibiotic to make sure the infection is gone. If the UTI still present she would then like to schedule appt with [facility] to discuss. Future order for U/A, Micro and Culture ordered.

## 2017-04-06 NOTE — Telephone Encounter (Signed)
Patient is welcome to come to the office to see me for a visit and we can perform a urine culture during the weekend.  Cc- Dr. Sabra Heck

## 2017-04-06 NOTE — Telephone Encounter (Signed)
Spoke with patient. Seen by Dr. Sabra Heck in June, treated for UTI with Macrobid, started feeling different, went to PCP still had bacteria, E-coli. Prescribed Sulfa drug, made her sick, changed to amoxicillin then provided another urine sample on 7/11 after completion of antibiotic. States Klebsiella in urine, Keflex prescribed, has not started.  Patient states her mother died of Cdiff and is always cautious of antibiotics, this would make the 4th one in 30 days. Requesting Dr. Sabra Heck review and advise. Advised patient Dr. Sabra Heck is out of the office, f/u with PCP -prescribing provider. Patient states she has called PCP, no return call. Patient states she will provide another urine sample if needed, wants to make sure she is taking correct antibiotic. Advised will review with covering provider and return call. Patient is agreeable.    Dr. Quincy Simmonds, please review and advise?  Cc: Dr. Sabra Heck

## 2017-04-06 NOTE — Telephone Encounter (Signed)
Spoke with patient, advised as seen below per Dr. Quincy Simmonds. Patient states Dr. Sabra Heck would allow her to leave a urine sample, declines OV. Patient states she will wait to hear back from PCP. Advised patient to return call to schedule OV if needed. Patient verbalizes understanding.  Routing to provider for final review. Patient is agreeable to disposition. Will close encounter.

## 2017-04-06 NOTE — Telephone Encounter (Signed)
I would recommend being seen for a discussion.   Laroy Apple, MD [facility] 04/06/2017, 3:06 PM

## 2017-04-06 NOTE — Telephone Encounter (Signed)
Patient was diagnosed with klebsiella bacteria at her pcp's office and would like to speak with nurse here before filling another prescription for antibiotics.

## 2017-04-06 NOTE — Telephone Encounter (Signed)
Patient just received last urine culture results this past Wednesday and has not picked up rx for Keflex yet. She is concerned because this will be the fourth antibiotic that she has been prescribed back to back. Her mother and mother in law died with C diff and she is very concerned because she has never had any type of urinary problems like this. She asked that you be sent a message because you reviewed the last urine culture. She said since it has been over a week should she pick up the antibiotic or should she come back in for a repeat urine? Please advise and route to [facility]

## 2017-04-10 ENCOUNTER — Ambulatory Visit: Payer: BLUE CROSS/BLUE SHIELD | Admitting: Obstetrics & Gynecology

## 2017-04-16 ENCOUNTER — Other Ambulatory Visit: Payer: Medicare HMO

## 2017-04-16 DIAGNOSIS — N3000 Acute cystitis without hematuria: Secondary | ICD-10-CM

## 2017-04-16 LAB — MICROSCOPIC EXAMINATION
Bacteria, UA: NONE SEEN
RBC, UA: NONE SEEN /hpf (ref 0–?)
Renal Epithel, UA: NONE SEEN /hpf
WBC, UA: NONE SEEN /hpf (ref 0–?)

## 2017-04-16 LAB — URINALYSIS, COMPLETE
Bilirubin, UA: NEGATIVE
Glucose, UA: NEGATIVE
Ketones, UA: NEGATIVE
Leukocytes, UA: NEGATIVE
Nitrite, UA: NEGATIVE
Protein, UA: NEGATIVE
RBC, UA: NEGATIVE
Specific Gravity, UA: <1.005 — ABNORMAL LOW (ref 1.005–1.030)
Urobilinogen, Ur: 0.2 mg/dL (ref 0.2–1.0)
pH, UA: 6 (ref 5.0–7.5)

## 2017-04-17 LAB — URINE CULTURE

## 2017-04-19 ENCOUNTER — Telehealth: Payer: Self-pay | Admitting: Family Medicine

## 2017-04-19 DIAGNOSIS — E7849 Other hyperlipidemia: Secondary | ICD-10-CM

## 2017-04-19 DIAGNOSIS — R5383 Other fatigue: Secondary | ICD-10-CM

## 2017-04-19 NOTE — Telephone Encounter (Signed)
Pt notified of culture results Pt wants lab order for Dr Laurance Flatten to check routine labs Order entered in Epic

## 2017-04-24 ENCOUNTER — Other Ambulatory Visit: Payer: Medicare HMO

## 2017-04-24 DIAGNOSIS — R5383 Other fatigue: Secondary | ICD-10-CM | POA: Diagnosis not present

## 2017-04-24 DIAGNOSIS — E7849 Other hyperlipidemia: Secondary | ICD-10-CM

## 2017-04-24 DIAGNOSIS — E784 Other hyperlipidemia: Secondary | ICD-10-CM | POA: Diagnosis not present

## 2017-04-25 LAB — BMP8+EGFR
BUN/Creatinine Ratio: 17 (ref 12–28)
BUN: 13 mg/dL (ref 8–27)
CO2: 23 mmol/L (ref 20–29)
Calcium: 9.1 mg/dL (ref 8.7–10.3)
Chloride: 104 mmol/L (ref 96–106)
Creatinine, Ser: 0.77 mg/dL (ref 0.57–1.00)
GFR calc Af Amer: 94 mL/min/{1.73_m2} (ref >59)
GFR calc non Af Amer: 81 mL/min/{1.73_m2} (ref >59)
Glucose: 106 mg/dL — ABNORMAL HIGH (ref 65–99)
Potassium: 4 mmol/L (ref 3.5–5.2)
Sodium: 141 mmol/L (ref 134–144)

## 2017-04-25 LAB — CBC WITH DIFFERENTIAL/PLATELET
Basophils Absolute: 0 10*3/uL (ref 0.0–0.2)
Basos: 1 %
EOS (ABSOLUTE): 0.1 10*3/uL (ref 0.0–0.4)
Eos: 2 %
Hematocrit: 37.5 % (ref 34.0–46.6)
Hemoglobin: 13 g/dL (ref 11.1–15.9)
Immature Grans (Abs): 0 10*3/uL (ref 0.0–0.1)
Immature Granulocytes: 0 %
Lymphocytes Absolute: 1.1 10*3/uL (ref 0.7–3.1)
Lymphs: 40 %
MCH: 33 pg (ref 26.6–33.0)
MCHC: 34.7 g/dL (ref 31.5–35.7)
MCV: 95 fL (ref 79–97)
Monocytes Absolute: 0.2 10*3/uL (ref 0.1–0.9)
Monocytes: 9 %
Neutrophils Absolute: 1.3 10*3/uL — ABNORMAL LOW (ref 1.4–7.0)
Neutrophils: 48 %
Platelets: 182 10*3/uL (ref 150–379)
RBC: 3.94 x10E6/uL (ref 3.77–5.28)
RDW: 13 % (ref 12.3–15.4)
WBC: 2.7 10*3/uL — ABNORMAL LOW (ref 3.4–10.8)

## 2017-04-25 LAB — LIPID PANEL
Chol/HDL Ratio: 3.5 ratio (ref 0.0–4.4)
Cholesterol, Total: 218 mg/dL — ABNORMAL HIGH (ref 100–199)
HDL: 63 mg/dL (ref >39)
LDL Calculated: 132 mg/dL — ABNORMAL HIGH (ref 0–99)
Triglycerides: 116 mg/dL (ref 0–149)
VLDL Cholesterol Cal: 23 mg/dL (ref 5–40)

## 2017-04-25 LAB — HEPATIC FUNCTION PANEL
ALT: 9 IU/L (ref 0–32)
AST: 16 IU/L (ref 0–40)
Albumin: 4.5 g/dL (ref 3.6–4.8)
Alkaline Phosphatase: 75 IU/L (ref 39–117)
Bilirubin Total: 1.1 mg/dL (ref 0.0–1.2)
Bilirubin, Direct: 0.19 mg/dL (ref 0.00–0.40)
Total Protein: 6.6 g/dL (ref 6.0–8.5)

## 2017-05-09 ENCOUNTER — Telehealth: Payer: Self-pay | Admitting: Family Medicine

## 2017-05-09 ENCOUNTER — Other Ambulatory Visit: Payer: Medicare HMO

## 2017-05-09 DIAGNOSIS — R300 Dysuria: Secondary | ICD-10-CM | POA: Diagnosis not present

## 2017-05-09 LAB — MICROSCOPIC EXAMINATION
Bacteria, UA: NONE SEEN
RBC, UA: NONE SEEN /hpf (ref 0–?)
Renal Epithel, UA: NONE SEEN /hpf

## 2017-05-09 LAB — URINALYSIS, COMPLETE
Bilirubin, UA: NEGATIVE
Glucose, UA: NEGATIVE
Ketones, UA: NEGATIVE
Leukocytes, UA: NEGATIVE
Nitrite, UA: NEGATIVE
Protein, UA: NEGATIVE
RBC, UA: NEGATIVE
Specific Gravity, UA: <1.005 — ABNORMAL LOW (ref 1.005–1.030)
Urobilinogen, Ur: 0.2 mg/dL (ref 0.2–1.0)
pH, UA: 6 (ref 5.0–7.5)

## 2017-05-09 NOTE — Telephone Encounter (Signed)
Came today for urine

## 2017-05-11 ENCOUNTER — Other Ambulatory Visit: Payer: Self-pay | Admitting: *Deleted

## 2017-05-11 DIAGNOSIS — N390 Urinary tract infection, site not specified: Secondary | ICD-10-CM

## 2017-05-11 LAB — URINE CULTURE

## 2017-05-11 MED ORDER — CIPROFLOXACIN HCL 500 MG PO TABS: 500.0000 mg | ORAL_TABLET | Freq: Two times a day (BID) | ORAL | 0 refills | Status: DC

## 2017-05-24 ENCOUNTER — Other Ambulatory Visit: Payer: Medicare HMO

## 2017-05-24 ENCOUNTER — Other Ambulatory Visit: Payer: Self-pay | Admitting: *Deleted

## 2017-05-24 DIAGNOSIS — Z87440 Personal history of urinary (tract) infections: Secondary | ICD-10-CM | POA: Diagnosis not present

## 2017-05-24 DIAGNOSIS — R5383 Other fatigue: Secondary | ICD-10-CM

## 2017-05-25 ENCOUNTER — Telehealth: Payer: Self-pay | Admitting: Family Medicine

## 2017-05-25 LAB — URINE CULTURE

## 2017-07-11 ENCOUNTER — Telehealth: Payer: Self-pay | Admitting: Obstetrics & Gynecology

## 2017-07-11 NOTE — Telephone Encounter (Signed)
Patient is having some swelling in her right arm near her armpit. Patient would like to see Dr.Miller.

## 2017-07-11 NOTE — Telephone Encounter (Signed)
Spoke with patient and she is complaining of swelling and possible lump under Rt.armpit for 1.5weeks. This area is also tender. No redness in this area but she states when looking in a mirror, it does look larger than Lt.breast and feels she may have a "lump" present. Made patient an appointment with Dr.Miller 07-02-17 at 3:30pm for evaluation.

## 2017-07-12 ENCOUNTER — Ambulatory Visit (INDEPENDENT_AMBULATORY_CARE_PROVIDER_SITE_OTHER): Payer: Medicare HMO | Admitting: Obstetrics & Gynecology

## 2017-07-12 VITALS — BP 130/80 | HR 72 | Resp 16 | Ht 64.25 in | Wt 165.0 lb

## 2017-07-12 DIAGNOSIS — N6310 Unspecified lump in the right breast, unspecified quadrant: Secondary | ICD-10-CM | POA: Diagnosis not present

## 2017-07-12 DIAGNOSIS — R3915 Urgency of urination: Secondary | ICD-10-CM | POA: Diagnosis not present

## 2017-07-12 LAB — POCT URINALYSIS DIPSTICK
Bilirubin, UA: NEGATIVE
Glucose, UA: NEGATIVE
Ketones, UA: NEGATIVE
Nitrite, UA: NEGATIVE
Protein, UA: NEGATIVE
Urobilinogen, UA: 0.2 E.U./dL
pH, UA: 5 (ref 5.0–8.0)

## 2017-07-12 MED ORDER — ESTRADIOL 0.1 MG/GM VA CREA: TOPICAL_CREAM | VAGINAL | 2 refills | Status: DC

## 2017-07-12 NOTE — Progress Notes (Signed)
Patient scheduled while in office. Spoke with Alaina at [facility]. Patient scheduled for right breast diagnostic [facility]G and [place] on 10/29 arriving at 2:10pm for 2:30pm appointment. Patient verbalizes understanding and is agreeable.

## 2017-07-12 NOTE — Progress Notes (Signed)
GYNECOLOGY  VISIT  CC:   Check axillary "finding", urinary urgency  HPI: 67 y.o. G70P2002 Married Caucasian female here c/o breast lump and urinary urgency.  Pt first noticed the area under her right axilla while she was in the shower.  Area is non tender.  She reports she is having some low back pain that has been going on about a year.  She fell and hit her back and saw Dr. Maureen Ralphs.  Was diagnosed with arthritis at that time as well as a bone spur.  Bothers her when she gets out of her recliner or if she does a lot of lifting.  Doesn't hurt in the morning.  Not exercising like she was in the past.    She is having some urinary urgency.  Has entercoccus positive urine 6/15 and then E coli positive urine 03/14/17.  She then had enterococcus and Klebsiella positive urine 8/22.    Hot flashes have improved since I saw her in June.  Reports this has improved over the last few months.  having some vaginal dryness and would like to use something that could help.  GYNECOLOGIC HISTORY: Patient's last menstrual period was 08/18/1989. Contraception: post menopausal  Menopausal hormone therapy: none  Patient Active Problem List   Diagnosis Date Noted  . Shingles 05/11/2014  . Vitamin D deficiency 09/02/2013  . Hyperlipidemia 09/02/2013  . 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    Past Medical History:  Diagnosis Date  . Bone spur    Left hip  . Endometriosis   . Hormone replacement therapy   . Hyperlipidemia   . 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   WBC 3,000 08/30/11 52 P 43 L  . Migraine without aura   . Vitamin D deficiency     Past Surgical History:  Procedure Laterality Date  . abdominal laporoscopic surgery    . APPENDECTOMY  1965   7th grade in the '60's  . CESAREAN SECTION     x2 '84 & '85  . TOTAL ABDOMINAL HYSTERECTOMY W/ BILATERAL SALPINGOOPHORECTOMY  08/1989   endometriosis    MEDS:   Current Outpatient Prescriptions on File Prior to Visit  Medication Sig Dispense Refill  . Ascorbic Acid  (VITAMIN C) 100 MG tablet Take 100 mg by mouth daily.    . Calcium Carbonate-Vitamin D 600-400 MG-UNIT per tablet Take 1 tablet by mouth daily. With magnesium    . Vitamin D, Ergocalciferol, (DRISDOL) 50000 units CAPS capsule Take 1 capsule (50,000 Units total) by mouth once a week. 12 capsule 3  . vitamin E 100 UNIT capsule Take by mouth daily.     No current facility-administered medications on file prior to visit.     ALLERGIES: Codeine; Influenza vaccines; and Sulfa antibiotics  Family History  Problem Relation Age of Onset  . Diabetes Mother   . Osteoporosis Mother   . Hypertension Mother   . Colon cancer Father 27  . Stomach cancer Paternal Grandfather   . Breast cancer Paternal Aunt        72's  . Esophageal cancer Neg Hx   . Rectal cancer Neg Hx     SH:  Married, non smoker  Review of Systems  Genitourinary: Positive for urgency.       Breast mass  Musculoskeletal: Positive for myalgias.  Endo/Heme/Allergies: Bruises/bleeds easily.  All other systems reviewed and are negative.   PHYSICAL EXAMINATION:    BP 130/80 (BP Location: Right Arm, Patient Position: Sitting, Cuff Size: Normal)   Pulse 72   Resp 16  Ht 5' 4.25" (1.632 m)   Wt 165 lb (74.8 kg)   LMP 08/18/1989   BMI 28.10 kg/m     Physical Exam  Constitutional: She is oriented to person, place, and time. She appears well-developed and well-nourished.  Neck: Normal range of motion. Neck supple. No thyromegaly present.  Cardiovascular: Normal rate and regular rhythm.   Respiratory: Effort normal and breath sounds normal. Right breast exhibits tenderness. Right breast exhibits no inverted nipple, no mass, no nipple discharge and no skin change. Left breast exhibits no inverted nipple, no mass, no nipple discharge, no skin change and no tenderness.    Lymphadenopathy:    She has no cervical adenopathy.    She has no axillary adenopathy.  Neurological: She is alert and oriented to person, place, and time.   Psychiatric: She has a normal mood and affect.   Chaperone was present for exam.  Assessment: Breast tenderness, possible mass Back pain that is likely arthritis Urinary urgency  Plan: Diagnostic right MMG scheduled Urine culture pending Estrace vaginal cream 1gm pv twice weekly

## 2017-07-13 LAB — URINE CULTURE: Organism ID, Bacteria: NO GROWTH

## 2017-07-15 ENCOUNTER — Encounter: Payer: Self-pay | Admitting: Obstetrics & Gynecology

## 2017-07-16 ENCOUNTER — Ambulatory Visit
Admission: RE | Admit: 2017-07-16 | Discharge: 2017-07-16 | Disposition: A | Discharge status: Home / Self Care | Payer: Medicare HMO | Source: Ambulatory Visit | Attending: Obstetrics & Gynecology | Admitting: Obstetrics & Gynecology

## 2017-07-16 ENCOUNTER — Telehealth: Payer: Self-pay | Admitting: *Deleted

## 2017-07-16 DIAGNOSIS — R928 Other abnormal and inconclusive findings on diagnostic imaging of breast: Secondary | ICD-10-CM | POA: Diagnosis not present

## 2017-07-16 DIAGNOSIS — N6310 Unspecified lump in the right breast, unspecified quadrant: Secondary | ICD-10-CM

## 2017-07-16 DIAGNOSIS — N6489 Other specified disorders of breast: Secondary | ICD-10-CM | POA: Diagnosis not present

## 2017-07-16 NOTE — Telephone Encounter (Signed)
Detailed message left per DPR with message as seen below from Dr. Miller. Advised patient to return call if any additional questions.   Encounter closed.       

## 2017-07-16 NOTE — Telephone Encounter (Signed)
I called in rx for estradiol cream 0.02% with Vit E to [place].  She can use it as we discussed--topically three times weekly.  This will be $51 for a three month supply.

## 2017-07-16 NOTE — Telephone Encounter (Signed)
Call to patient. Results reviewed with patient and she verbalized understanding. Patient states she went to pick up her prescription for estrace vaginal cream and it was going to cost her $260 for a 6 month supply. Patient states she does not have prescription insurance and asking if there is anything she can try to see if the estrace cream is "actually going to work" before she spends the $260, or if there is an alternative option. RN advised would review with Dr. Sabra Heck and return call. Patient agreeable.   Routing to provider for review.

## 2017-07-16 NOTE — Telephone Encounter (Signed)
-----   Message from Megan Salon, MD sent at 07/15/2017 11:23 PM EDT ----- Please let pt know her urine culture was negative.  I do think her back pain is more arthritis related at this time.

## 2017-07-19 ENCOUNTER — Other Ambulatory Visit: Payer: Self-pay | Admitting: *Deleted

## 2017-07-19 NOTE — Telephone Encounter (Signed)
Notes recorded by Burnice Logan, RN on 07/19/2017 at 1:39 PM EDT Left message to call Sharee Pimple at [phone]. See telephone encounter dated 07/19/17.  ------  Notes recorded by Megan Salon, MD on 07/18/2017 at 6:50 AM EDT Please call pt and let her know MMG was normal. I think they discussed this with her at the time of imaging. Recommended MMG in 6 months to stay on routine scheduling. Is she ok with this plan? Out of MMG hold.

## 2017-07-23 NOTE — Telephone Encounter (Signed)
Spoke with patient, advised as seen below per Dr. Sabra Heck, patient is agreeable with plan.   Patient states she discussed with radiologist possible causes of the "lump", estrogen and caffeine discussed as possible causes . Patient states she decided not to start estrace after this discussion, would like Dr. Sabra Heck to advise on possible alternative for vaginal dryness.   Reviewed option of coconut oil, patient has not tried this is the past.   Advised patient would also review with Dr. Sabra Heck and return call with any additional recommendations, patient verbalizes understanding and is agreeable.  Dr. Sabra Heck -please review and advise?

## 2017-07-24 NOTE — Telephone Encounter (Signed)
She could also use Vit E vaginal suppositories.  These need to be compounded and could be done at [facility].  They are about $40 for a 3 month supple.  If she is interested in trying this, I can send a prescription to [facility].  Thanks.

## 2017-07-25 ENCOUNTER — Telehealth: Payer: Self-pay | Admitting: Family Medicine

## 2017-07-25 NOTE — Telephone Encounter (Signed)
Pt notified that we do have preservative free flu Pt had possible reaction to vaccine in the past Pt received Flu- block last year with no problem

## 2017-07-25 NOTE — Telephone Encounter (Signed)
Krista Henderson spoke with pt

## 2017-07-25 NOTE — Telephone Encounter (Signed)
Spoke with patient, advised as seen below per Dr. Sabra Heck. Patient would like to try Vit E vaginal suppositories. Advised patient Dr. Sabra Heck is out of the office today, will fax RX to [facility] when she returns on 11/8. Advised patient will notify once RX sent. Patient verbalizes understanding and is agreeable.   Dr. Sabra Heck -see pended order for Vitamin E.

## 2017-07-26 ENCOUNTER — Ambulatory Visit: Payer: Medicare HMO

## 2017-07-27 MED ORDER — NONFORMULARY OR COMPOUNDED ITEM: 3 refills | Status: DC

## 2017-07-30 NOTE — Telephone Encounter (Signed)
RX Vit E Vaginal Suppositories faxed to [facility].  Left detailed message, ok per current dpr. Advised Rx for Vit E suppositories sent to [facility], f/u with pharmacy for filling. Return call to office at [phone] for any additional questions. Will close encounter.

## 2017-08-03 ENCOUNTER — Telehealth: Payer: Self-pay | Admitting: Obstetrics & Gynecology

## 2017-08-03 NOTE — Telephone Encounter (Signed)
Spoke with patient. Patient states she placed Vitamin E vaginal suppository last night for the first time. Reports vaginal burning started after the suppository was inserted, kept her up through most of the night. States the packing that Vit E was in was difficult to open.   Denies any other GYN complaints.   Feels better this morning, some vaginal burning.   Advised patient would review with Dr. Sabra Heck and return call. Patient is agreeable.

## 2017-08-03 NOTE — Telephone Encounter (Signed)
The discomfort can be due to the dryness and just starting to use the Vit E.  Ok to not continue using but if tries again, I would do it in the morning so if has discomfort, it doesn't keep her from sleeping.  Thanks.

## 2017-08-03 NOTE — Telephone Encounter (Signed)
Patient used the vitamin E suppository last night and states that it burned really bad.  Would like to speak with a nurse about it.

## 2017-08-03 NOTE — Telephone Encounter (Signed)
Spoke with patient, advised as seen below per Dr. Sabra Heck. Patient verbalizes understanding and is agreeable. Will close encounter.

## 2017-08-06 ENCOUNTER — Telehealth: Payer: Self-pay | Admitting: Obstetrics & Gynecology

## 2017-08-06 NOTE — Telephone Encounter (Signed)
Patient is asking to talk with Dr.Miller's nurse. Patient states this is an ongoing conversation since last Friday.

## 2017-08-06 NOTE — Telephone Encounter (Signed)
Spoke with patient. Reports she has not used Vit E vaginal suppository since last Thursday, has developed new symptoms and requesting to leave urine specimen.   Reports urinary urgency, red bruised rash on abdomen -no blisters. Night sweats on Friday night. Vag d/c started as thick mucous to cream, clear and light brown.   Denies lower back pain or fever.   Recommended OV for further evaluation. Scheduled for OV on 11/21 at 11:15am with Dr. Sabra Heck. Advised patient Dr. Sabra Heck will review, I will return call with any additional recommendations. Patient is agreeable.   Routing to provider for final review. Patient is agreeable to disposition. Will close encounter.

## 2017-08-08 ENCOUNTER — Encounter: Payer: Self-pay | Admitting: Obstetrics & Gynecology

## 2017-08-08 ENCOUNTER — Other Ambulatory Visit: Payer: Self-pay

## 2017-08-08 ENCOUNTER — Ambulatory Visit (INDEPENDENT_AMBULATORY_CARE_PROVIDER_SITE_OTHER): Payer: Medicare HMO | Admitting: Obstetrics & Gynecology

## 2017-08-08 VITALS — BP 128/80 | HR 76 | Temp 97.8°F | Resp 16 | Wt 165.0 lb

## 2017-08-08 DIAGNOSIS — T50905A Adverse effect of unspecified drugs, medicaments and biological substances, initial encounter: Secondary | ICD-10-CM | POA: Diagnosis not present

## 2017-08-08 DIAGNOSIS — K640 First degree hemorrhoids: Secondary | ICD-10-CM | POA: Diagnosis not present

## 2017-08-08 DIAGNOSIS — R309 Painful micturition, unspecified: Secondary | ICD-10-CM

## 2017-08-08 LAB — POCT URINALYSIS DIPSTICK
Bilirubin, UA: NEGATIVE
Blood, UA: NEGATIVE
Glucose, UA: NEGATIVE
Ketones, UA: NEGATIVE
Leukocytes, UA: NEGATIVE
Nitrite, UA: NEGATIVE
Protein, UA: NEGATIVE
Urobilinogen, UA: 0.2 E.U./dL
pH, UA: 5 (ref 5.0–8.0)

## 2017-08-08 MED ORDER — HYDROCORTISONE 2.5 % RE CREA: 1.0000 "application " | TOPICAL_CREAM | Freq: Two times a day (BID) | RECTAL | 1 refills | Status: DC

## 2017-08-08 NOTE — Progress Notes (Signed)
GYNECOLOGY  VISIT  CC:   Side effects from vaginal suppositories  HPI: 66 y.o. G47P2002 Married Caucasian female here for discussion of possible side effects related ot Vit E vaginal suppository use.  Used one this past week and had vaginal burning after the suppository was placed.  Symptoms of burning slowly improved but then she also passed some brownish mucous about two days later.  She also had a funny rash on her abdomen with a bruise that seemed to coincide with the vaginal discharge/old blood.  She reports the abdominal rash was itchy for about three days.  This has resolved.  She wonders if any of this is related.  Also, reports rectal pain after recent looser bowel movements.  H/o hemorrhoids.  Denies rectal bleeding.  Wants to know what can be used for this.  GYNECOLOGIC HISTORY: Patient's last menstrual period was 08/18/1989. Contraception: PMP Menopausal hormone therapy: none  Patient Active Problem List   Diagnosis Date Noted  . Shingles 05/11/2014  . Vitamin D deficiency 09/02/2013  . Hyperlipidemia 09/02/2013  . 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    Past Medical History:  Diagnosis Date  . Bone spur    Left hip  . Endometriosis   . Hyperlipidemia   . 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   WBC 3,000 08/30/11 52 P 43 L  . Migraine without aura   . Vitamin D deficiency     Past Surgical History:  Procedure Laterality Date  . APPENDECTOMY  1965   7th grade in the '60's  . CESAREAN SECTION     x2 '84 & '85  . DIAGNOSTIC LAPAROSCOPY    . TOTAL ABDOMINAL HYSTERECTOMY W/ BILATERAL SALPINGOOPHORECTOMY  08/1989   endometriosis    MEDS:   Current Outpatient Medications on File Prior to Visit  Medication Sig Dispense Refill  . Ascorbic Acid (VITAMIN C) 100 MG tablet Take 100 mg by mouth daily.    . Calcium Carbonate-Vitamin D 600-400 MG-UNIT per tablet Take 1 tablet by mouth daily. With magnesium    . Probiotic Product (PROBIOTIC ADVANCED PO) Take by mouth daily.    . Vitamin D,  Ergocalciferol, (DRISDOL) 50000 units CAPS capsule Take 1 capsule (50,000 Units total) by mouth once a week. 12 capsule 3  . vitamin E 100 UNIT capsule Take by mouth daily.    Marland Kitchen estradiol (ESTRACE) 0.1 MG/GM vaginal cream 1 gram vaginally twice weekly (Patient not taking: Reported on 08/08/2017) 42.5 g 2  . NONFORMULARY OR COMPOUNDED ITEM Vitamin E vaginal suppositories. 200 u/ml. One pv three times weekly. (Patient not taking: Reported on 08/08/2017) 36 each 3   No current facility-administered medications on file prior to visit.     ALLERGIES: Codeine; Influenza vaccines; and Sulfa antibiotics  Family History  Problem Relation Age of Onset  . Diabetes Mother   . Osteoporosis Mother   . Hypertension Mother   . Colon cancer Father 17  . Stomach cancer Paternal Grandfather   . Breast cancer Paternal Aunt        79's  . Esophageal cancer Neg Hx   . Rectal cancer Neg Hx     SH:  Married, non smoker  Review of Systems  Constitutional: Negative.   Respiratory: Negative.   Cardiovascular: Negative.   Gastrointestinal:       Rectal pain and focal swelling  Genitourinary:       Vaginal pain that has resolved  Neurological: Negative.     PHYSICAL EXAMINATION:    BP 128/80 (BP Location: Right Arm, Patient Position: Sitting,  Cuff Size: Normal)   Pulse 76   Temp 97.8 F (36.6 C) (Oral)   Resp 16   Wt 165 lb (74.8 kg)   LMP 08/18/1989   BMI 28.10 kg/m     General appearance: alert, cooperative and appears stated age Abdomen: soft, non-tender; bowel sounds normal; no masses,  no organomegaly Skin: mild erythematous rash about 3 x 3cm on abdomen  Pelvic: External genitalia:  no lesions              Urethra:  normal appearing urethra with no masses, tenderness or lesions              Bartholins and Skenes: normal                 Vagina: normal appearing vagina with normal color and discharge, no lesions              Cervix: absent              Bimanual Exam:  Uterus:  uterus  absent              Adnexa: no mass, fullness, tenderness              Rectovaginal: No..  Confirms.  hemorrhoid at 12 o'clock, non thrombosed  Chaperone was present for exam.  Assessment: Possible reaction to Vit E vaginal suppositories.  Pt has not decided if she is actually going to try these again.   hemorrhoid  Plan: Topical Anusol HC 2.5% cream every 4-6 hours as needed for up to 7 days.  If not resolved by then, she may need additional recommendations.  Pt will notify me if this is the case.   ~15 minutes spent with patient >50% of time was in face to face discussion of above.

## 2017-09-18 DIAGNOSIS — M779 Enthesopathy, unspecified: Secondary | ICD-10-CM

## 2017-09-18 HISTORY — DX: Enthesopathy, unspecified: M77.9

## 2017-09-25 ENCOUNTER — Ambulatory Visit (INDEPENDENT_AMBULATORY_CARE_PROVIDER_SITE_OTHER): Payer: Medicare HMO | Admitting: *Deleted

## 2017-09-25 DIAGNOSIS — Z23 Encounter for immunization: Secondary | ICD-10-CM | POA: Diagnosis not present

## 2017-09-25 DIAGNOSIS — R69 Illness, unspecified: Secondary | ICD-10-CM | POA: Diagnosis not present

## 2017-10-17 DIAGNOSIS — M25551 Pain in right hip: Secondary | ICD-10-CM | POA: Diagnosis not present

## 2017-10-17 DIAGNOSIS — M545 Low back pain: Secondary | ICD-10-CM | POA: Diagnosis not present

## 2017-10-17 DIAGNOSIS — M25552 Pain in left hip: Secondary | ICD-10-CM | POA: Diagnosis not present

## 2017-10-25 ENCOUNTER — Ambulatory Visit: Payer: Medicare HMO | Attending: Orthopedic Surgery | Admitting: Physical Therapy

## 2017-10-25 ENCOUNTER — Other Ambulatory Visit: Payer: Self-pay

## 2017-10-25 DIAGNOSIS — M545 Low back pain: Secondary | ICD-10-CM | POA: Diagnosis not present

## 2017-10-25 DIAGNOSIS — M25552 Pain in left hip: Secondary | ICD-10-CM | POA: Diagnosis not present

## 2017-10-25 DIAGNOSIS — G8929 Other chronic pain: Secondary | ICD-10-CM | POA: Diagnosis not present

## 2017-10-25 DIAGNOSIS — M25551 Pain in right hip: Secondary | ICD-10-CM | POA: Diagnosis not present

## 2017-10-25 NOTE — Therapy (Signed)
[facility] [place], [place], [address] Phone: [phone]   Fax:  [phone]  Physical Therapy Treatment  Patient Details  Name: Krista Henderson MRN: [id number] Date of Birth: [birth year] Referring Provider: Fredonia Highland,   Encounter Date: 10/25/2017  PT End of Session - 10/25/17 1433    Visit Number  1    Number of Visits  12    Date for PT Re-Evaluation  12/06/17    PT Start Time  0947    PT Stop Time  1450    PT Time Calculation (min)  61 min    Activity Tolerance  Patient tolerated treatment well    Behavior During Therapy  [facility] for tasks assessed/performed       Past Medical History:  Diagnosis Date  . Bone spur    Left hip  . Endometriosis   . Hyperlipidemia   . 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   WBC 3,000 08/30/11 52 P 43 L  . Migraine without aura   . Vitamin D deficiency     Past Surgical History:  Procedure Laterality Date  . APPENDECTOMY  1965   7th grade in the '60's  . CESAREAN SECTION     x2 '84 & '85  . DIAGNOSTIC LAPAROSCOPY    . TOTAL ABDOMINAL HYSTERECTOMY W/ BILATERAL SALPINGOOPHORECTOMY  08/1989   endometriosis    There were no vitals filed for this visit.  Subjective Assessment - 10/25/17 1404    Subjective   Patient arrives at physical therapy with complaints of low back pain and bilateral hip pain starting about one year ago. Patient stated over the course of a year, back pain and hip pain has been occasional but has exacerbated in the past 3-4 months. 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 Patient stated she has received cortisone injections in the low back in June which helped decrease pain. She recently received a cortisone injection in the left hip on Wednesday, Jan 31. Patient reported improvement in pain, but pain persists. Patient states laying on her left side and sitting increases pain to about  7-8/10 in both hip and low back. At rest, there is no pain. Patient's goal is to decrease pain, improve strength and improve movement to return to recreational activities.    Limitations  Sitting    Diagnostic tests  X-Ray    Patient Stated Goals  decrease pain, improve movement, improve strength    Currently in Pain?  No/denies         [facility] PT Assessment - 10/25/17 0001      Assessment   Medical Diagnosis  Low back pain, Bilateral IT band    Referring Provider  Fredonia Highland,    Onset Date/Surgical Date  -- one year ago    Next MD Visit  March 8    Prior Therapy  No      Balance Screen   Has the patient fallen in the past 6 months  No    Has the patient had a decrease in activity level because of a fear of falling?   No    Is the patient reluctant to leave their home because of a fear of falling?   No      Home Environment   Living Environment  Private residence    Living Arrangements  Spouse/significant other    Type of [place] Access  Stairs to enter      Prior Function   Level of Independence  Independent    Vocation  Retired      Observation/Other Assessments   Focus on Therapeutic Outcomes (FOTO)   31% limitation      Sensation   Light Touch  Appears Intact      Posture/Postural Control   Posture/Postural Control  Postural limitations    Postural Limitations  Increased lumbar lordosis;Rounded Shoulders;Forward head      ROM / Strength   AROM / PROM / Strength  AROM;PROM;Strength      AROM   Overall AROM   Within functional limits for tasks performed grossly assessed throughout evaluation      PROM   Overall PROM   Deficits    Overall PROM Comments  decreased bilateral hip extension, hip internal rotation    PROM Assessment Site  Hip    Right/Left Hip  Right;Left    Right Hip Internal Rotation   20    Left Hip Extension  10    Left Hip Internal Rotation   25      Strength   Overall Strength  Within functional limits for tasks performed     Overall Strength Comments  Hip abduction bilaterally 3+/5; Hip and knee flexion 4+/5 bilaterally      Flexibility   Soft Tissue Assessment /Muscle Length  yes    Hamstrings  WNL      Palpation   SI assessment   equal leg lengths and ASIS, (-) SI compression test      Special Tests    Special Tests  Hip Special Tests    Hip Special Tests   Ober's Test;Patrick (FABER) Test      Saralyn Pilar [facility]) Test   Findings  Positive    Side  Right;Left      Ober's Test   Findings  Negative    Side  Right;Left      Transfers   Transfers  Independent with all Transfers                  [facility] Adult PT Treatment/Exercise - 10/25/17 0001      Exercises   Exercises  Knee/Hip      Knee/Hip Exercises: Aerobic   Nustep  Level 4, x10 minutes             PT Education - 10/25/17 1551    Education provided  Yes    Education Details  HEP consist of draw ins, supine marches, sidelying ITB stretch    Person(s) Educated  Patient    Methods  Verbal cues;Tactile cues;Explanation;Demonstration;Handout    Comprehension  Verbalized understanding;Returned demonstration       PT Short Term Goals - 10/25/17 2200      PT SHORT TERM GOAL #1   Title  Patient will be independent with HEP    Time  2    Period  Weeks    Status  New    Target Date  11/08/17        PT Long Term Goals - 10/25/17 2201      PT LONG TERM GOAL #1   Title  Patient will decrease at worst pain to less than or equal to 3/10 in order perform household activities.    Time  6    Period  Weeks    Status  New    Target Date  12/06/17      PT LONG TERM GOAL #2   Title  Patient will increase bilateral hip flexion, extension, and abduction strength to 5/5 in order to perform functional activities.    Time  6    Period  Weeks    Status  New    Target Date  12/06/17      PT LONG TERM GOAL #3   Title  Patient will sit comfortably for greater than 1 hour with less than or equal to 1/10 pain.    Time  6     Period  Weeks    Status  New    Target Date  12/06/17      PT LONG TERM GOAL #4   Title  Patient will demonstrate proper lifting mechanics to protect back while lifting grandchild.    Time  6    Period  Weeks    Status  New            Plan - 10/25/17 2208    Clinical Impression Statement  Patient is a 67 year old female who presents to physical therapy with low back pain and bilateral hip pain. Patient noted with increased lumbar lordosis, forward head and rounded shoulders in sitting and standing position. Patient noted with mild lower muscle weakness and decreased bilateral hip internal rotation and hip extension PROM. No tenderness to palpation noted at greater trochanter. (-) modified Ober's test bilaterally. Patient would benefit from skilled physical therapy to decrease pain, improve overall mobility and LE strength to return to PLOF.    History and Personal Factors relevant to plan of care:  Left bone spur    Clinical Presentation  Stable    Clinical Decision Making  Low    Rehab Potential  Good    PT Frequency  2x / week    PT Duration  6 weeks    PT Treatment/Interventions  ADLs/Self Care Home Management;Electrical Stimulation;Cryotherapy;Ultrasound;Moist Heat;Iontophoresis 4mg /ml Dexamethasone;Gait training;Stair training;Therapeutic activities;Therapeutic exercise;Patient/family education;Neuromuscular re-education;Balance training;Manual techniques;Dry needling;Passive range of motion    PT Next Visit Plan  Begin with Nustep, core stabilization exercises and hip strengthening exercises, modalities for pain relief PRN.    PT Home Exercise Plan  Draw ins, supine marching, sidelying ITB stretch     Consulted and Agree with Plan of Care  Patient       Patient will benefit from skilled therapeutic intervention in order to improve the following deficits and impairments:  Pain, Decreased range of motion, Decreased strength, Postural dysfunction  Visit Diagnosis: Chronic  midline low back pain, with sciatica presence unspecified  Pain in left hip  Pain in right hip     Problem List Patient Active Problem List   Diagnosis Date Noted  . Shingles 05/11/2014  . Vitamin D deficiency 09/02/2013  . Hyperlipidemia 09/02/2013  . 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    Gabriela Eves, PT, DPT 10/25/2017, 10:11 PM  [facility] [address] [place], [place], [address] Phone: [phone]   Fax:  [phone]  Name: Krista Henderson MRN: [id number] Date of Birth: 1951-09-13

## 2017-10-25 NOTE — Patient Instructions (Signed)
   Jla Reynolds, PT, DPT [facility] [address] [place], [place], [address] Phone: [phone]   Fax:  [phone]  

## 2017-10-29 ENCOUNTER — Encounter: Payer: Self-pay | Admitting: Physical Therapy

## 2017-10-29 ENCOUNTER — Ambulatory Visit: Payer: Medicare HMO | Admitting: Physical Therapy

## 2017-10-29 DIAGNOSIS — M25552 Pain in left hip: Secondary | ICD-10-CM | POA: Diagnosis not present

## 2017-10-29 DIAGNOSIS — M545 Low back pain: Principal | ICD-10-CM

## 2017-10-29 DIAGNOSIS — G8929 Other chronic pain: Secondary | ICD-10-CM | POA: Diagnosis not present

## 2017-10-29 DIAGNOSIS — M25551 Pain in right hip: Secondary | ICD-10-CM

## 2017-10-29 NOTE — Therapy (Signed)
[facility] [place], [place], [address] Phone: [phone]   Fax:  [phone]  Physical Therapy Treatment  Patient Details  Name: NATALIEE SHURTZ MRN: [id number] Date of Birth: [birth year] Referring Provider: Fredonia Highland,   Encounter Date: 10/29/2017  PT End of Session - 10/29/17 1158    Visit Number  2    Number of Visits  12    Date for PT Re-Evaluation  12/06/17    PT Start Time  0946    PT Stop Time  1041    PT Time Calculation (min)  55 min    Activity Tolerance  Patient tolerated treatment well    Behavior During Therapy  [facility] for tasks assessed/performed       Past Medical History:  Diagnosis Date  . Bone spur    Left hip  . Endometriosis   . Hyperlipidemia   . 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   WBC 3,000 08/30/11 52 P 43 L  . Migraine without aura   . Vitamin D deficiency     Past Surgical History:  Procedure Laterality Date  . APPENDECTOMY  1965   7th grade in the '60's  . CESAREAN SECTION     x2 '84 & '85  . DIAGNOSTIC LAPAROSCOPY    . TOTAL ABDOMINAL HYSTERECTOMY W/ BILATERAL SALPINGOOPHORECTOMY  08/1989   endometriosis    There were no vitals filed for this visit.  Subjective Assessment - 10/29/17 1200    Subjective  The patient reports a recent injection and Prednisone has helped a lot.  She took a long ride and got out of the car with low pain whicg is very unusual.      Patient Stated Goals  decrease pain, improve movement, improve strength                      [facility] Adult PT Treatment/Exercise - 10/29/17 0001      Exercises   Exercises  Knee/Hip      Knee/Hip Exercises: Aerobic   Nustep  Level 4 x 10 minutes.      Modalities   Modalities  Electrical Stimulation;Moist Heat      Moist Heat Therapy   Number Minutes Moist Heat  20 Minutes    Moist Heat Location  Lumbar Spine      Electrical Stimulation   Electrical Stimulation Location  Left low back and upper left gluteal  region.    Electrical Stimulation Action  IFC    Electrical Stimulation Parameters  80-150 Hz on 100% scan x 20 minutes.    Electrical Stimulation Goals  Pain      Manual Therapy   Manual Therapy  Soft tissue mobilization    Manual therapy comments  Right sdly position with folded pillow between knees for comfort:  Palpably the patient's left lower lumbar erector and quadratus lumborum is very taut to palpation.  Performed STW/M to the muscle group as well as to the left sacral-iliac and iliolumbar ligament x 13 minutes.             PT Education - 10/29/17 1201    Education Details  Sleeping with pillows between knees while on side.       PT Short Term Goals - 10/25/17 2200      PT SHORT TERM GOAL #1   Title  Patient will be independent with HEP    Time  2    Period  Weeks    Status  New  Target Date  11/08/17        PT Long Term Goals - 10/25/17 2201      PT LONG TERM GOAL #1   Title  Patient will decrease at worst pain to less than or equal to 3/10 in order perform household activities.    Time  6    Period  Weeks    Status  New    Target Date  12/06/17      PT LONG TERM GOAL #2   Title  Patient will increase bilateral hip flexion, extension, and abduction strength to 5/5 in order to perform functional activities.    Time  6    Period  Weeks    Status  New    Target Date  12/06/17      PT LONG TERM GOAL #3   Title  Patient will sit comfortably for greater than 1 hour with less than or equal to 1/10 pain.    Time  6    Period  Weeks    Status  New    Target Date  12/06/17      PT LONG TERM GOAL #4   Title  Patient will demonstrate proper lifting mechanics to protect back while lifting grandchild.    Time  6    Period  Weeks    Status  New            Plan - 10/29/17 1214    Clinical Impression Statement  The patient stated she felt "great" after tretament.  Her left QL was taut to palpation and responded well to STW/M.    PT  Treatment/Interventions  ADLs/Self Care Home Management;Electrical Stimulation;Cryotherapy;Ultrasound;Moist Heat;Iontophoresis 4mg /ml Dexamethasone;Gait training;Stair training;Therapeutic activities;Therapeutic exercise;Patient/family education;Neuromuscular re-education;Balance training;Manual techniques;Dry needling;Passive range of motion    Consulted and Agree with Plan of Care  Patient       Patient will benefit from skilled therapeutic intervention in order to improve the following deficits and impairments:  Pain, Decreased range of motion, Decreased strength, Postural dysfunction  Visit Diagnosis: Chronic midline low back pain, with sciatica presence unspecified  Pain in left hip  Pain in right hip     Problem List Patient Active Problem List   Diagnosis Date Noted  . Shingles 05/11/2014  . Vitamin D deficiency 09/02/2013  . Hyperlipidemia 09/02/2013  . 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    APPLEGATE, [place] MPT 10/29/2017, 12:16 PM  [facility] [address] [place], [place], [address] Phone: [phone]   Fax:  [phone]  Name: GENIEVE RAMASWAMY MRN: [id number] Date of Birth: [birth year]

## 2017-11-01 ENCOUNTER — Ambulatory Visit: Payer: Medicare HMO | Admitting: Physical Therapy

## 2017-11-01 DIAGNOSIS — G8929 Other chronic pain: Secondary | ICD-10-CM

## 2017-11-01 DIAGNOSIS — M25552 Pain in left hip: Secondary | ICD-10-CM | POA: Diagnosis not present

## 2017-11-01 DIAGNOSIS — M25551 Pain in right hip: Secondary | ICD-10-CM | POA: Diagnosis not present

## 2017-11-01 DIAGNOSIS — M545 Low back pain: Principal | ICD-10-CM

## 2017-11-01 NOTE — Therapy (Signed)
[facility] [place], [place], [address] Phone: [phone]   Fax:  [phone]  Physical Therapy Treatment  Patient Details  Name: Krista Henderson MRN: [id number] Date of Birth: January 02, 1951 Referring Provider: Fredonia Highland,   Encounter Date: 11/01/2017  PT End of Session - 11/01/17 0915    Visit Number  3    Number of Visits  12    Date for PT Re-Evaluation  12/06/17    PT Start Time  0900    PT Stop Time  0953    PT Time Calculation (min)  53 min    Activity Tolerance  Patient tolerated treatment well    Behavior During Therapy  [facility] for tasks assessed/performed       Past Medical History:  Diagnosis Date  . Bone spur    Left hip  . Endometriosis   . Hyperlipidemia   . 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   WBC 3,000 08/30/11 52 P 43 L  . Migraine without aura   . Vitamin D deficiency     Past Surgical History:  Procedure Laterality Date  . APPENDECTOMY  1965   7th grade in the '60's  . CESAREAN SECTION     x2 '84 & '85  . DIAGNOSTIC LAPAROSCOPY    . TOTAL ABDOMINAL HYSTERECTOMY W/ BILATERAL SALPINGOOPHORECTOMY  08/1989   endometriosis    There were no vitals filed for this visit.  Subjective Assessment - 11/01/17 0915    Subjective  Patient feeling alright. "I still feel just a little twinge in the lower back especially when I sleep."    Limitations  Sitting    Diagnostic tests  X-Ray    Patient Stated Goals  decrease pain, improve movement, improve strength         [facility] PT Assessment - 11/01/17 0001      Assessment   Medical Diagnosis  Low back pain, Bilateral IT band    Next MD Visit  March 8    Prior Therapy  No                  [facility] Adult PT Treatment/Exercise - 11/01/17 0001      Exercises   Exercises  Knee/Hip      Knee/Hip Exercises: Aerobic   Nustep  Level 3 x 15 minutes.      Knee/Hip Exercises: Supine   Bridges  Strengthening;Both;2 sets;10 reps    Single Leg Bridge   Strengthening;Both;2 sets;10 reps      Modalities   Modalities  Electrical Stimulation      Moist Heat Therapy   Number Minutes Moist Heat  15 Minutes    Moist Heat Location  Lumbar Spine      Electrical Stimulation   Electrical Stimulation Location  Left low back and upper left gluteal region.    Electrical Stimulation Action  IFC    Electrical Stimulation Parameters  80-150 Hz x15    Electrical Stimulation Goals  Pain      Manual Therapy   Manual Therapy  Soft tissue mobilization    Manual therapy comments  Right sdly position with folded pillow between knees for comfort:  Palpably the patient's left lower lumbar erector and quadratus lumborum is very taut to palpation.  Performed STW/M to the muscle group as well as to the left sacral-iliac and iliolumbar ligament x 8 minutes.               PT Short Term Goals - 10/25/17 2200  PT SHORT TERM GOAL #1   Title  Patient will be independent with HEP    Time  2    Period  Weeks    Status  New    Target Date  11/08/17        PT Long Term Goals - 10/25/17 2201      PT LONG TERM GOAL #1   Title  Patient will decrease at worst pain to less than or equal to 3/10 in order perform household activities.    Time  6    Period  Weeks    Status  New    Target Date  12/06/17      PT LONG TERM GOAL #2   Title  Patient will increase bilateral hip flexion, extension, and abduction strength to 5/5 in order to perform functional activities.    Time  6    Period  Weeks    Status  New    Target Date  12/06/17      PT LONG TERM GOAL #3   Title  Patient will sit comfortably for greater than 1 hour with less than or equal to 1/10 pain.    Time  6    Period  Weeks    Status  New    Target Date  12/06/17      PT LONG TERM GOAL #4   Title  Patient will demonstrate proper lifting mechanics to protect back while lifting grandchild.    Time  6    Period  Weeks    Status  New            Plan - 11/01/17 0950    Clinical  Impression Statement  Patient able to complete treatment with no complaints of pain or discomfort in low back or hips. Patient required verbal and cuing to maintain straight knee during  SLR exercise. Patient continues to have taught QL but responded well to STW/M. No adverse affects noted upon removal of e-stim.    Clinical Presentation  Stable    Clinical Decision Making  Low    Rehab Potential  Good    PT Frequency  2x / week    PT Duration  6 weeks    PT Treatment/Interventions  ADLs/Self Care Home Management;Electrical Stimulation;Cryotherapy;Ultrasound;Moist Heat;Iontophoresis 4mg /ml Dexamethasone;Gait training;Stair training;Therapeutic activities;Therapeutic exercise;Patient/family education;Neuromuscular re-education;Balance training;Manual techniques;Dry needling;Passive range of motion    PT Next Visit Plan  Core stabilization, hip strengthening and modalities PRN.    Consulted and Agree with Plan of Care  Patient       Patient will benefit from skilled therapeutic intervention in order to improve the following deficits and impairments:  Pain, Decreased range of motion, Decreased strength, Postural dysfunction  Visit Diagnosis: Chronic midline low back pain, with sciatica presence unspecified  Pain in left hip  Pain in right hip     Problem List Patient Active Problem List   Diagnosis Date Noted  . Shingles 05/11/2014  . Vitamin D deficiency 09/02/2013  . Hyperlipidemia 09/02/2013  . 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   Krista Henderson, PT, DPT 11/01/2017, 9:52 AM  [facility] [address] [place], [place], [address] Phone: [phone]   Fax:  [phone]  Name: Krista Henderson MRN: [id number] Date of Birth: [birth year]

## 2017-11-06 ENCOUNTER — Encounter: Payer: Medicare HMO | Admitting: Physical Therapy

## 2017-11-07 ENCOUNTER — Ambulatory Visit: Payer: Medicare HMO | Admitting: Physical Therapy

## 2017-11-07 DIAGNOSIS — M545 Low back pain: Secondary | ICD-10-CM | POA: Diagnosis not present

## 2017-11-07 DIAGNOSIS — M25551 Pain in right hip: Secondary | ICD-10-CM

## 2017-11-07 DIAGNOSIS — G8929 Other chronic pain: Secondary | ICD-10-CM | POA: Diagnosis not present

## 2017-11-07 DIAGNOSIS — M25552 Pain in left hip: Secondary | ICD-10-CM | POA: Diagnosis not present

## 2017-11-07 NOTE — Therapy (Signed)
[facility] [place], [place], [address] Phone: [phone]   Fax:  [phone]  Physical Therapy Treatment  Patient Details  Name: Krista Henderson MRN: [id number] Date of Birth: [birth year] Referring Provider: Fredonia Highland,   Encounter Date: 11/07/2017  PT End of Session - 11/07/17 1353    Visit Number  4    Number of Visits  12    Date for PT Re-Evaluation  12/06/17    PT Start Time  1346    PT Stop Time  1443    PT Time Calculation (min)  57 min    Activity Tolerance  Patient tolerated treatment well    Behavior During Therapy  [facility] for tasks assessed/performed       Past Medical History:  Diagnosis Date  . Bone spur    Left hip  . Endometriosis   . Hyperlipidemia   . 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   WBC 3,000 08/30/11 52 P 43 L  . Migraine without aura   . Vitamin D deficiency     Past Surgical History:  Procedure Laterality Date  . APPENDECTOMY  1965   7th grade in the '60's  . CESAREAN SECTION     x2 '84 & '85  . DIAGNOSTIC LAPAROSCOPY    . TOTAL ABDOMINAL HYSTERECTOMY W/ BILATERAL SALPINGOOPHORECTOMY  08/1989   endometriosis    There were no vitals filed for this visit.  Subjective Assessment - 11/07/17 1347    Subjective  Patient reported 5/10 pain in low back. She went to her daughter's yesterday and lifted boxes and ascended/descended steps to the third floor multiple times and it aggravated her back. Hip was sore but feels fine today.    Limitations  Sitting    Diagnostic tests  X-Ray    Patient Stated Goals  decrease pain, improve movement, improve strength    Currently in Pain?  Yes    Pain Score  5     Pain Location  Back    Pain Orientation  Lower    Pain Descriptors / Indicators  Sore;Aching                      [facility] Adult PT Treatment/Exercise - 11/07/17 0001      Exercises   Exercises  Knee/Hip;Lumbar      Lumbar Exercises: Supine   Bent Knee Raise  20 reps;3 seconds       Knee/Hip Exercises: Aerobic   Nustep  Level 3 x 12 minutes.      Knee/Hip Exercises: Supine   Bridges  Strengthening;2 sets;10 reps      Knee/Hip Exercises: Sidelying   Clams  yellow theraband 2x10  each      Modalities   Modalities  Electrical Stimulation      Moist Heat Therapy   Number Minutes Moist Heat  10 Minutes    Moist Heat Location  Lumbar Spine      Electrical Stimulation   Electrical Stimulation Location  Low back/upper gluteal region    Electrical Stimulation Action  IFC    Electrical Stimulation Parameters  80-150 Hz x10    Electrical Stimulation Goals  Pain      Manual Therapy   Manual Therapy  Soft tissue mobilization    Manual therapy comments  Right sdly position with folded pillow between knees for comfort:  Palpably the patient's left lower lumbar erector and quadratus lumborum is very taut to palpation.  Performed STW/M to the muscle group as well  as to the left sacral-iliac and iliolumbar ligament x  12 minutes.               PT Short Term Goals - 10/25/17 2200      PT SHORT TERM GOAL #1   Title  Patient will be independent with HEP    Time  2    Period  Weeks    Status  New    Target Date  11/08/17        PT Long Term Goals - 10/25/17 2201      PT LONG TERM GOAL #1   Title  Patient will decrease at worst pain to less than or equal to 3/10 in order perform household activities.    Time  6    Period  Weeks    Status  New    Target Date  12/06/17      PT LONG TERM GOAL #2   Title  Patient will increase bilateral hip flexion, extension, and abduction strength to 5/5 in order to perform functional activities.    Time  6    Period  Weeks    Status  New    Target Date  12/06/17      PT LONG TERM GOAL #3   Title  Patient will sit comfortably for greater than 1 hour with less than or equal to 1/10 pain.    Time  6    Period  Weeks    Status  New    Target Date  12/06/17      PT LONG TERM GOAL #4   Title  Patient will demonstrate  proper lifting mechanics to protect back while lifting grandchild.    Time  6    Period  Weeks    Status  New            Plan - 11/07/17 1438    Clinical Impression Statement  Patient was able to complete exercises with no complaints or increase of pain. Patient rest breaks during clamshell exercise secondary to muscle fatigue. Patient responded wll to STW/M; still taught QL. No adverse affects noted upon removal of modalities.    Clinical Presentation  Stable    Clinical Decision Making  Low    Rehab Potential  Good    PT Frequency  2x / week    PT Duration  6 weeks    PT Treatment/Interventions  ADLs/Self Care Home Management;Electrical Stimulation;Cryotherapy;Ultrasound;Moist Heat;Iontophoresis 4mg /ml Dexamethasone;Gait training;Stair training;Therapeutic activities;Therapeutic exercise;Patient/family education;Neuromuscular re-education;Balance training;Manual techniques;Dry needling;Passive range of motion    PT Next Visit Plan  Core stabilization, hip strengthening and modalities PRN.    Consulted and Agree with Plan of Care  Patient       Patient will benefit from skilled therapeutic intervention in order to improve the following deficits and impairments:  Pain, Decreased range of motion, Decreased strength, Postural dysfunction  Visit Diagnosis: Chronic midline low back pain, with sciatica presence unspecified  Pain in left hip  Pain in right hip     Problem List Patient Active Problem List   Diagnosis Date Noted  . Shingles 05/11/2014  . Vitamin D deficiency 09/02/2013  . Hyperlipidemia 09/02/2013  . 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   Gabriela Eves, PT, DPT 11/07/2017, 2:51 PM  [facility] [address] [place], [place], [address] Phone: [phone]   Fax:  [phone]  Name: Krista Henderson MRN: [id number] Date of Birth: Nov 15, 1950

## 2017-11-08 ENCOUNTER — Encounter: Payer: Medicare HMO | Admitting: Physical Therapy

## 2017-11-08 ENCOUNTER — Other Ambulatory Visit: Payer: Self-pay | Admitting: Pediatrics

## 2017-11-09 ENCOUNTER — Ambulatory Visit: Payer: Medicare HMO | Admitting: Physical Therapy

## 2017-11-09 DIAGNOSIS — G8929 Other chronic pain: Secondary | ICD-10-CM

## 2017-11-09 DIAGNOSIS — M25552 Pain in left hip: Secondary | ICD-10-CM

## 2017-11-09 DIAGNOSIS — M545 Low back pain: Secondary | ICD-10-CM | POA: Diagnosis not present

## 2017-11-09 DIAGNOSIS — M25551 Pain in right hip: Secondary | ICD-10-CM

## 2017-11-09 NOTE — Telephone Encounter (Signed)
Last Vit D 12/25/16  24.1

## 2017-11-09 NOTE — Therapy (Signed)
[facility] [place], [place], [address] Phone: [phone]   Fax:  [phone]  Physical Therapy Treatment  Patient Details  Name: Krista Henderson MRN: [id number] Date of Birth: December 19, 1950 Referring Provider: Fredonia Highland,   Encounter Date: 11/09/2017  PT End of Session - 11/09/17 0916    Visit Number  5    Number of Visits  12    Date for PT Re-Evaluation  12/06/17    PT Start Time  0900    PT Stop Time  0953    PT Time Calculation (min)  53 min    Activity Tolerance  Patient tolerated treatment well    Behavior During Therapy  [facility] for tasks assessed/performed       Past Medical History:  Diagnosis Date  . Bone spur    Left hip  . Endometriosis   . Hyperlipidemia   . 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   WBC 3,000 08/30/11 52 P 43 L  . Migraine without aura   . Vitamin D deficiency     Past Surgical History:  Procedure Laterality Date  . APPENDECTOMY  1965   7th grade in the '60's  . CESAREAN SECTION     x2 '84 & '85  . DIAGNOSTIC LAPAROSCOPY    . TOTAL ABDOMINAL HYSTERECTOMY W/ BILATERAL SALPINGOOPHORECTOMY  08/1989   endometriosis    There were no vitals filed for this visit.  Subjective Assessment - 11/09/17 1250    Subjective  Patient reports 2/10 pain in back today. Patient states hips were hurting yesterday but today there is no pain.    Limitations  Sitting    Diagnostic tests  X-Ray    Patient Stated Goals  decrease pain, improve movement, improve strength    Currently in Pain?  Yes    Pain Score  2     Pain Location  Back    Pain Orientation  Lower;Left    Pain Descriptors / Indicators  Sore         [facility] PT Assessment - 11/09/17 0001      Assessment   Medical Diagnosis  Low back pain, Bilateral IT band    Next MD Visit  March 8    Prior Therapy  No                  [facility] Adult PT Treatment/Exercise - 11/09/17 0001      Exercises   Exercises  Lumbar;Knee/Hip      Lumbar  Exercises: Standing   Row  Strengthening;Both;20 reps Pink XTS    Shoulder Extension  Strengthening;Both;20 reps Pink XTS      Knee/Hip Exercises: Aerobic   Nustep  Level 4 x 12 minutes.      Knee/Hip Exercises: Standing   Hip Flexion  AROM;Both;20 reps;Knee bent    Hip ADduction  AROM;Both;20 reps    Hip Extension  AROM;20 reps;Knee straight      Modalities   Modalities  Electrical Stimulation;Moist Heat      Moist Heat Therapy   Number Minutes Moist Heat  10 Minutes    Moist Heat Location  Lumbar Spine      Electrical Stimulation   Electrical Stimulation Location  Left low back/ gluteal region    Electrical Stimulation Action  IFC    Electrical Stimulation Parameters  80-150 Hz x10    Electrical Stimulation Goals  Pain      Manual Therapy   Manual Therapy  Soft tissue mobilization    Manual therapy  comments  L quadratus lumborum, ilolumbar ligament, Trigger point release               PT Short Term Goals - 10/25/17 2200      PT SHORT TERM GOAL #1   Title  Patient will be independent with HEP    Time  2    Period  Weeks    Status  New    Target Date  11/08/17        PT Long Term Goals - 10/25/17 2201      PT LONG TERM GOAL #1   Title  Patient will decrease at worst pain to less than or equal to 3/10 in order perform household activities.    Time  6    Period  Weeks    Status  New    Target Date  12/06/17      PT LONG TERM GOAL #2   Title  Patient will increase bilateral hip flexion, extension, and abduction strength to 5/5 in order to perform functional activities.    Time  6    Period  Weeks    Status  New    Target Date  12/06/17      PT LONG TERM GOAL #3   Title  Patient will sit comfortably for greater than 1 hour with less than or equal to 1/10 pain.    Time  6    Period  Weeks    Status  New    Target Date  12/06/17      PT LONG TERM GOAL #4   Title  Patient will demonstrate proper lifting mechanics to protect back while lifting  grandchild.    Time  6    Period  Weeks    Status  New            Plan - 11/09/17 1035    Clinical Impression Statement  Patient was able to tolerate treatment well though reported feeling "sore" during abduction exercises. Patient educated to take rest breaks throughout exercise. Patient required verbal cuing, demonstration and tactile cuing to prevent lateral flexion and compensation for hip abduction exercise. Patient able to complete with improved form. Patient instructed to perform lumbar flexion stretch PRN as HEP. Pt in agreement. Normal response to modalities upon removal.    Clinical Presentation  Stable    Clinical Decision Making  Low    Rehab Potential  Good    PT Frequency  2x / week    PT Duration  6 weeks    PT Treatment/Interventions  ADLs/Self Care Home Management;Electrical Stimulation;Cryotherapy;Ultrasound;Moist Heat;Iontophoresis 4mg /ml Dexamethasone;Gait training;Stair training;Therapeutic activities;Therapeutic exercise;Patient/family education;Neuromuscular re-education;Balance training;Manual techniques;Dry needling;Passive range of motion    PT Next Visit Plan  Core stabilization, hip strengthening and modalities PRN.    Consulted and Agree with Plan of Care  Patient       Patient will benefit from skilled therapeutic intervention in order to improve the following deficits and impairments:  Pain, Decreased range of motion, Decreased strength, Postural dysfunction  Visit Diagnosis: Chronic midline low back pain, with sciatica presence unspecified  Pain in left hip  Pain in right hip     Problem List Patient Active Problem List   Diagnosis Date Noted  . Shingles 05/11/2014  . Vitamin D deficiency 09/02/2013  . Hyperlipidemia 09/02/2013  . 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   Gabriela Eves, PT, DPT 11/09/2017, 12:53 PM  [facility] [address] [place], [place], [address] Phone: [phone]   Fax:  [phone]  Name: Krista Henderson MRN: [id number] Date of Birth: 11/01/1950

## 2017-11-12 ENCOUNTER — Encounter: Payer: Self-pay | Admitting: Physical Therapy

## 2017-11-12 ENCOUNTER — Ambulatory Visit: Payer: Medicare HMO | Admitting: Physical Therapy

## 2017-11-12 DIAGNOSIS — M25551 Pain in right hip: Secondary | ICD-10-CM

## 2017-11-12 DIAGNOSIS — M25552 Pain in left hip: Secondary | ICD-10-CM | POA: Diagnosis not present

## 2017-11-12 DIAGNOSIS — M545 Low back pain: Secondary | ICD-10-CM | POA: Diagnosis not present

## 2017-11-12 DIAGNOSIS — G8929 Other chronic pain: Secondary | ICD-10-CM | POA: Diagnosis not present

## 2017-11-12 NOTE — Therapy (Signed)
[facility] [place], [place], [address] Phone: [phone]   Fax:  [phone]  Physical Therapy Treatment  Patient Details  Name: Krista Henderson MRN: [id number] Date of Birth: 10-19-1950 Referring Provider: Fredonia Highland,   Encounter Date: 11/12/2017  PT End of Session - 11/12/17 1030    Visit Number  6    Number of Visits  12    Date for PT Re-Evaluation  12/06/17    PT Start Time  0948    PT Stop Time  1044    PT Time Calculation (min)  56 min    Activity Tolerance  Patient tolerated treatment well    Behavior During Therapy  [facility] for tasks assessed/performed       Past Medical History:  Diagnosis Date  . Bone spur    Left hip  . Endometriosis   . Hyperlipidemia   . 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   WBC 3,000 08/30/11 52 Henderson 43 L  . Migraine without aura   . Vitamin D deficiency     Past Surgical History:  Procedure Laterality Date  . APPENDECTOMY  1965   7th grade in the '60's  . CESAREAN SECTION     x2 '84 & '85  . DIAGNOSTIC LAPAROSCOPY    . TOTAL ABDOMINAL HYSTERECTOMY W/ BILATERAL SALPINGOOPHORECTOMY  08/1989   endometriosis    There were no vitals filed for this visit.  Subjective Assessment - 11/12/17 0955    Subjective  Patient arrived with ongoing reported discomfort in low back    Limitations  Sitting    Diagnostic tests  X-Ray    Patient Stated Goals  decrease pain, improve movement, improve strength    Currently in Pain?  Yes    Pain Score  2     Pain Location  Back    Pain Orientation  Lower    Pain Descriptors / Indicators  Sore    Pain Type  Chronic pain    Pain Onset  More than Henderson month ago    Pain Frequency  Intermittent    Aggravating Factors   prolong activity    Pain Relieving Factors  at rest                      [facility] Adult PT Treatment/Exercise - 11/12/17 0001      Lumbar Exercises: Standing   Row  Strengthening;Both;20 reps;Limitations    Row Limitations  pink XTS     Shoulder Extension  Strengthening;Both;20 reps;Limitations    Shoulder Extension Limitations  pink XTS      Lumbar Exercises: Supine   Ab Set  10 reps;3 seconds    Glut Set  10 reps;3 seconds    Bent Knee Raise  20 reps;3 seconds    Bridge  20 reps    Straight Leg Raise  20 reps;3 seconds      Knee/Hip Exercises: Aerobic   Nustep  Level 4 x 12 minutes.      Knee/Hip Exercises: Supine   Other Supine Knee/Hip Exercises  clam with red t-band x30      Moist Heat Therapy   Number Minutes Moist Heat  15 Minutes    Moist Heat Location  Lumbar Spine      Electrical Stimulation   Electrical Stimulation Location  --    Electrical Stimulation Action  IFC no charge due to machine not working    Printmaker Parameters  --    Printmaker Goals  --  PT Short Term Goals - 11/12/17 1035      PT SHORT TERM GOAL #1   Title  Patient will be independent with HEP    Time  2    Period  Weeks    Status  Achieved        PT Long Term Goals - 11/12/17 1035      PT LONG TERM GOAL #1   Title  Patient will decrease at worst pain to less than or equal to 3/10 in order perform household activities.    Time  6    Period  Weeks    Status  On-going      PT LONG TERM GOAL #2   Title  Patient will increase bilateral hip flexion, extension, and abduction strength to 5/5 in order to perform functional activities.    Time  6    Period  Weeks    Status  On-going      PT LONG TERM GOAL #3   Title  Patient will sit comfortably for greater than 1 hour with less than or equal to 1/10 pain.    Time  6    Period  Weeks    Status  On-going      PT LONG TERM GOAL #4   Title  Patient will demonstrate proper lifting mechanics to protect back while lifting grandchild.    Time  6    Period  Weeks    Status  On-going            Plan - 11/12/17 1031    Clinical Impression Statement  Patient tolerated treatment well today. Patient able to progress with  core and hip strengtheniung exercises today. Focused on education with posture awareness techniques with exercises and core activation. Patient has had relief ever since she had Henderson shot and was on meds and able to perform all activities with greater ease. Patient reported doing HEP as instucted. STG's met LT Goals ongoing at this time.    Rehab Potential  Good    PT Frequency  2x / week    PT Duration  6 weeks    PT Treatment/Interventions  ADLs/Self Care Home Management;Electrical Stimulation;Cryotherapy;Ultrasound;Moist Heat;Iontophoresis 67m/ml Dexamethasone;Gait training;Stair training;Therapeutic activities;Therapeutic exercise;Patient/family education;Neuromuscular re-education;Balance training;Manual techniques;Dry needling;Passive range of motion    PT Next Visit Plan  Core stabilization, hip strengthening and modalities PRN. MD 11/23/17    Consulted and Agree with Plan of Care  Patient       Patient will benefit from skilled therapeutic intervention in order to improve the following deficits and impairments:  Pain, Decreased range of motion, Decreased strength, Postural dysfunction  Visit Diagnosis: Chronic midline low back pain, with sciatica presence unspecified  Pain in left hip  Pain in right hip     Problem List Patient Active Problem List   Diagnosis Date Noted  . Shingles 05/11/2014  . Vitamin D deficiency 09/02/2013  . Hyperlipidemia 09/02/2013  . 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    Krista Henderson, Krista Henderson, PTA 11/12/2017, 10:51 AM  [facility] [address]Phone: [phone]  Fax:  [phone] Name: Krista ALVARENGAMRN: [id number] of Birth: 1April 16, 1952

## 2017-11-14 ENCOUNTER — Encounter: Payer: Medicare HMO | Admitting: Physical Therapy

## 2017-11-16 ENCOUNTER — Encounter: Payer: Medicare HMO | Admitting: *Deleted

## 2017-11-19 DIAGNOSIS — X32XXXD Exposure to sunlight, subsequent encounter: Secondary | ICD-10-CM | POA: Diagnosis not present

## 2017-11-19 DIAGNOSIS — L728 Other follicular cysts of the skin and subcutaneous tissue: Secondary | ICD-10-CM | POA: Diagnosis not present

## 2017-11-19 DIAGNOSIS — B070 Plantar wart: Secondary | ICD-10-CM | POA: Diagnosis not present

## 2017-11-19 DIAGNOSIS — L570 Actinic keratosis: Secondary | ICD-10-CM | POA: Diagnosis not present

## 2017-11-20 ENCOUNTER — Ambulatory Visit: Payer: Medicare HMO | Attending: Orthopedic Surgery | Admitting: Physical Therapy

## 2017-11-20 DIAGNOSIS — M25551 Pain in right hip: Secondary | ICD-10-CM | POA: Diagnosis not present

## 2017-11-20 DIAGNOSIS — M545 Low back pain: Secondary | ICD-10-CM | POA: Diagnosis not present

## 2017-11-20 DIAGNOSIS — M25552 Pain in left hip: Secondary | ICD-10-CM

## 2017-11-20 DIAGNOSIS — G8929 Other chronic pain: Secondary | ICD-10-CM | POA: Diagnosis not present

## 2017-11-20 NOTE — Therapy (Signed)
[facility] [place], [place], [address] Phone: [phone]   Fax:  [phone]  Physical Therapy Treatment  Patient Details  Name: Krista Henderson MRN: [id number] Date of Birth: April 03, 1951 Referring Provider: Fredonia Highland,   Encounter Date: 11/20/2017  PT End of Session - 11/20/17 0954    Visit Number  7    Number of Visits  12    Date for PT Re-Evaluation  12/06/17    PT Start Time  0900    PT Stop Time  0954    PT Time Calculation (min)  54 min    Activity Tolerance  Patient tolerated treatment well    Behavior During Therapy  [facility] for tasks assessed/performed       Past Medical History:  Diagnosis Date  . Bone spur    Left hip  . Endometriosis   . Hyperlipidemia   . 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   WBC 3,000 08/30/11 52 P 43 L  . Migraine without aura   . Vitamin D deficiency     Past Surgical History:  Procedure Laterality Date  . APPENDECTOMY  1965   7th grade in the '60's  . CESAREAN SECTION     x2 '84 & '85  . DIAGNOSTIC LAPAROSCOPY    . TOTAL ABDOMINAL HYSTERECTOMY W/ BILATERAL SALPINGOOPHORECTOMY  08/1989   endometriosis    There were no vitals filed for this visit.  Subjective Assessment - 11/20/17 0906    Subjective  Patient stated she fell forward on friday after tripping on a toy. Patient stated she had some soreness in the back and L hip after fall but today she is still feeling residual back pain. Patient also stated she had cyst removed from right ear and sea wart removed from L heel and requested to PT "be easy" on her.     Limitations  Sitting    Diagnostic tests  X-Ray    Patient Stated Goals  decrease pain, improve movement, improve strength    Currently in Pain?  Yes    Pain Score  4     Pain Location  Back    Pain Orientation  Lower;Left    Pain Descriptors / Indicators  Sore    Pain Onset  More than a month ago         [facility] PT Assessment - 11/20/17 0001      Assessment   Medical  Diagnosis  Low back pain, Bilateral IT band    Next MD Visit  March 8    Prior Therapy  No                  [facility] Adult PT Treatment/Exercise - 11/20/17 0001      Exercises   Exercises  Lumbar;Knee/Hip      Lumbar Exercises: Stretches   Single Knee to Chest Stretch  5 reps;Right;Left;20 seconds    Lower Trunk Rotation  5 reps;20 seconds      Lumbar Exercises: Supine   Pelvic Tilt  20 reps with marching    Other Supine Lumbar Exercises  supine clams red x20      Knee/Hip Exercises: Aerobic   Stationary Bike  Level 3 x12 minutes      Moist Heat Therapy   Number Minutes Moist Heat  15 Minutes    Moist Heat Location  Lumbar Spine;Hip L hip      Electrical Stimulation   Electrical Stimulation Location  Left low back/ gluteal region    Electrical Stimulation  Action  IFC    Electrical Stimulation Parameters  80-150 Hz x15    Electrical Stimulation Goals  Pain               PT Short Term Goals - 11/12/17 1035      PT SHORT TERM GOAL #1   Title  Patient will be independent with HEP    Time  2    Period  Weeks    Status  Achieved        PT Long Term Goals - 11/12/17 1035      PT LONG TERM GOAL #1   Title  Patient will decrease at worst pain to less than or equal to 3/10 in order perform household activities.    Time  6    Period  Weeks    Status  On-going      PT LONG TERM GOAL #2   Title  Patient will increase bilateral hip flexion, extension, and abduction strength to 5/5 in order to perform functional activities.    Time  6    Period  Weeks    Status  On-going      PT LONG TERM GOAL #3   Title  Patient will sit comfortably for greater than 1 hour with less than or equal to 1/10 pain.    Time  6    Period  Weeks    Status  On-going      PT LONG TERM GOAL #4   Title  Patient will demonstrate proper lifting mechanics to protect back while lifting grandchild.    Time  6    Period  Weeks    Status  On-going            Plan - 11/20/17  0919    Clinical Impression Statement  Patient's exercises were focused on supine exercises to prevent pain with WB on L heel. Patient demonstrated proper form with exercises with minimal cuing requried. Patient instructed to perform lower trunk rotation stretch and single knee to chest stretch daily. Patient in agreement. Normal response to modalities upon removal.    Clinical Presentation  Stable    Clinical Decision Making  Low    Rehab Potential  Good       Patient will benefit from skilled therapeutic intervention in order to improve the following deficits and impairments:  Pain, Decreased range of motion, Decreased strength, Postural dysfunction  Visit Diagnosis: Chronic midline low back pain, with sciatica presence unspecified  Pain in left hip  Pain in right hip     Problem List Patient Active Problem List   Diagnosis Date Noted  . Shingles 05/11/2014  . Vitamin D deficiency 09/02/2013  . Hyperlipidemia 09/02/2013  . 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   Gabriela Eves, PT, DPT 11/20/2017, 9:59 AM  [facility] [address] [place], [place], [address] Phone: [phone]   Fax:  [phone]  Name: Krista Henderson MRN: [id number] Date of Birth: September 02, 1951

## 2017-11-22 ENCOUNTER — Encounter: Payer: Self-pay | Admitting: Physical Therapy

## 2017-11-22 ENCOUNTER — Ambulatory Visit: Payer: Medicare HMO | Admitting: Physical Therapy

## 2017-11-22 DIAGNOSIS — G8929 Other chronic pain: Secondary | ICD-10-CM | POA: Diagnosis not present

## 2017-11-22 DIAGNOSIS — M545 Low back pain: Principal | ICD-10-CM

## 2017-11-22 DIAGNOSIS — M25551 Pain in right hip: Secondary | ICD-10-CM | POA: Diagnosis not present

## 2017-11-22 DIAGNOSIS — M25552 Pain in left hip: Secondary | ICD-10-CM

## 2017-11-22 NOTE — Therapy (Signed)
[facility] [place], [place], [address] Phone: [phone]   Fax:  [phone]  Physical Therapy Treatment  Patient Details  Name: Krista Henderson MRN: [id number] Date of Birth: 1950/09/22 Referring Provider: Fredonia Highland,   Encounter Date: 11/22/2017  PT End of Session - 11/22/17 1012    Visit Number  8    Number of Visits  12    Date for PT Re-Evaluation  12/06/17    PT Start Time  0946    PT Stop Time  1028    PT Time Calculation (min)  42 min    Activity Tolerance  Patient tolerated treatment well;Other (comment) limited by other medical issue    Behavior During Therapy  [facility] for tasks assessed/performed       Past Medical History:  Diagnosis Date  . Bone spur    Left hip  . Endometriosis   . Hyperlipidemia   . 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   WBC 3,000 08/30/11 52 Henderson 43 L  . Migraine without aura   . Vitamin D deficiency     Past Surgical History:  Procedure Laterality Date  . APPENDECTOMY  1965   7th grade in the '60's  . CESAREAN SECTION     x2 '84 & '85  . DIAGNOSTIC LAPAROSCOPY    . TOTAL ABDOMINAL HYSTERECTOMY W/ BILATERAL SALPINGOOPHORECTOMY  08/1989   endometriosis    There were no vitals filed for this visit.  Subjective Assessment - 11/22/17 0956    Subjective  Patient not feeling well today and would like to keep treatment mild    Limitations  Sitting    Diagnostic tests  X-Ray    Patient Stated Goals  decrease pain, improve movement, improve strength    Currently in Pain?  Yes    Pain Score  4     Pain Location  Back    Pain Orientation  Lower;Left    Pain Onset  More than a month ago    Pain Frequency  Intermittent    Aggravating Factors   prolong activity    Pain Relieving Factors  rest                      [facility] Adult PT Treatment/Exercise - 11/22/17 0001      Lumbar Exercises: Supine   Ab Set  20 reps;3 seconds    Glut Set  20 reps;3 seconds    Bent Knee Raise  20  reps;3 seconds;Limitations    Bent Knee Raise Limitations  educational cues for technique    Bridge  20 reps    Other Supine Lumbar Exercises  supine clams red x30      Knee/Hip Exercises: Aerobic   Nustep  Level 4 x 10 minutes.      Moist Heat Therapy   Number Minutes Moist Heat  15 Minutes    Moist Heat Location  Lumbar Spine      Electrical Stimulation   Electrical Stimulation Location  Left low back/ gluteal region    Electrical Stimulation Action  IFC    Electrical Stimulation Parameters  80-150hz  x41min    Electrical Stimulation Goals  Pain               PT Short Term Goals - 11/12/17 1035      PT SHORT TERM GOAL #1   Title  Patient will be independent with HEP    Time  2    Period  Weeks  Status  Achieved        PT Long Term Goals - 11/12/17 1035      PT LONG TERM GOAL #1   Title  Patient will decrease at worst pain to less than or equal to 3/10 in order perform household activities.    Time  6    Period  Weeks    Status  On-going      PT LONG TERM GOAL #2   Title  Patient will increase bilateral hip flexion, extension, and abduction strength to 5/5 in order to perform functional activities.    Time  6    Period  Weeks    Status  On-going      PT LONG TERM GOAL #3   Title  Patient will sit comfortably for greater than 1 hour with less than or equal to 1/10 pain.    Time  6    Period  Weeks    Status  On-going      PT LONG TERM GOAL #4   Title  Patient will demonstrate proper lifting mechanics to protect back while lifting grandchild.    Time  6    Period  Weeks    Status  On-going            Plan - 11/22/17 1013    Clinical Impression Statement  Patient tolerted treatment fair yet limited due to not feeling well and had cyst cut out of ear and on foot so requested short treatment. Patient has reported overall improvement and able to perform ADL's with greater ease. Patient has ongoing pain reported from a fall yet feels improvement.  Goals ongoing at this time.     Rehab Potential  Good    PT Frequency  2x / week    PT Duration  6 weeks    PT Treatment/Interventions  ADLs/Self Care Home Management;Electrical Stimulation;Cryotherapy;Ultrasound;Moist Heat;Iontophoresis 4mg /ml Dexamethasone;Gait training;Stair training;Therapeutic activities;Therapeutic exercise;Patient/family education;Neuromuscular re-education;Balance training;Manual techniques;Dry needling;Passive range of motion    PT Next Visit Plan  Core stabilization, hip strengthening and modalities PRN. MD 11/30/17    Consulted and Agree with Plan of Care  Patient       Patient will benefit from skilled therapeutic intervention in order to improve the following deficits and impairments:  Pain, Decreased range of motion, Decreased strength, Postural dysfunction  Visit Diagnosis: Chronic midline low back pain, with sciatica presence unspecified  Pain in left hip     Problem List Patient Active Problem List   Diagnosis Date Noted  . Shingles 05/11/2014  . Vitamin D deficiency 09/02/2013  . Hyperlipidemia 09/02/2013  . 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    Krista Henderson, PTA 11/22/2017, 10:30 AM  [facility] [place], [place], [address] Phone: [phone]   Fax:  [phone]  Name: Krista Henderson MRN: [id number] Date of Birth: 29-Oct-1950

## 2017-11-26 ENCOUNTER — Encounter: Payer: Self-pay | Admitting: Family Medicine

## 2017-11-26 ENCOUNTER — Ambulatory Visit (INDEPENDENT_AMBULATORY_CARE_PROVIDER_SITE_OTHER): Payer: Medicare HMO | Admitting: Family Medicine

## 2017-11-26 VITALS — BP 123/75 | HR 84 | Temp 97.9°F | Ht 64.25 in | Wt 167.0 lb

## 2017-11-26 DIAGNOSIS — H6503 Acute serous otitis media, bilateral: Secondary | ICD-10-CM | POA: Diagnosis not present

## 2017-11-26 MED ORDER — AMOXICILLIN-POT CLAVULANATE 875-125 MG PO TABS: 1.0000 | ORAL_TABLET | Freq: Two times a day (BID) | ORAL | 0 refills | Status: DC

## 2017-11-26 MED ORDER — BENZONATATE 200 MG PO CAPS: 200.0000 mg | ORAL_CAPSULE | Freq: Three times a day (TID) | ORAL | 0 refills | Status: DC | PRN

## 2017-11-26 NOTE — Progress Notes (Signed)
Chief Complaint  Patient presents with  . Otalgia    pt here today c/o right ear pain and congestion    HPI  Patient presents today for onset about 4 days ago of congestion and sore throat.  She has had a small cyst removed from her left external ear at the tragus 1 week ago.  Another lesion in the area was frozen.  She developed an earache 3 days ago with pain on the right.  Worse when she laid on that side at night.  For the last day or so she is also had some pain in the left ear.  She has developed some posterior drainage but no fever chills.  She does have a hacking cough.  She is also had congestion that has been relieved by Mucinex multisymptom that includes a decongestant.  In the remote past the patient had a tympanic rupture.  PMH: Smoking status noted ROS: Per HPI  Objective: BP 123/75   Pulse 84   Temp 97.9 F (36.6 C) (Oral)   Ht 5' 4.25" (1.632 m)   Wt 167 lb (75.8 kg)   LMP 08/18/1989   BMI 28.44 kg/m  Gen: NAD, alert, cooperative with exam HEENT: NCAT, EOMI, PERRL.  The right TM is mildly erythematous with slight erythema of the canal.  The left TM has a glue ear appearance with air-fluid level.  There is no sign of infection at the level of the cyst CV: RRR, good S1/S2, no murmur Resp: CTABL, no wheezes, non-labored Abd: SNTND, BS present, no guarding or organomegaly Ext: No edema, warm Neuro: Alert and oriented, No gross deficits  Assessment and plan:  1. Bilateral acute serous otitis media, recurrence not specified     Meds ordered this encounter  Medications  . amoxicillin-clavulanate (AUGMENTIN) 875-125 MG tablet    Sig: Take 1 tablet by mouth 2 (two) times daily. Take all of this medication    Dispense:  20 tablet    Refill:  0  . benzonatate (TESSALON) 200 MG capsule    Sig: Take 1 capsule (200 mg total) by mouth 3 (three) times daily as needed for cough.    Dispense:  20 capsule    Refill:  0    No orders of the defined types were placed in this  encounter.   Follow up as needed.  Claretta Fraise, MD

## 2017-11-27 ENCOUNTER — Ambulatory Visit: Payer: Medicare HMO | Admitting: Physical Therapy

## 2017-11-27 DIAGNOSIS — M545 Low back pain: Secondary | ICD-10-CM | POA: Diagnosis not present

## 2017-11-27 DIAGNOSIS — M25551 Pain in right hip: Secondary | ICD-10-CM | POA: Diagnosis not present

## 2017-11-27 DIAGNOSIS — G8929 Other chronic pain: Secondary | ICD-10-CM | POA: Diagnosis not present

## 2017-11-27 DIAGNOSIS — M25552 Pain in left hip: Secondary | ICD-10-CM | POA: Diagnosis not present

## 2017-11-27 NOTE — Therapy (Signed)
[facility] [place], [place], [address] Phone: [phone]   Fax:  [phone]  Physical Therapy Treatment  Patient Details  Name: Krista Henderson MRN: [id number] Date of Birth: [birth year] Referring Provider: Fredonia Highland,   Encounter Date: 11/27/2017  PT End of Session - 11/27/17 0958    Visit Number  9    Number of Visits  12    Date for PT Re-Evaluation  12/06/17    PT Start Time  0946    PT Stop Time  1040    PT Time Calculation (min)  54 min    Activity Tolerance  Patient tolerated treatment well    Behavior During Therapy  [facility] for tasks assessed/performed       Past Medical History:  Diagnosis Date  . Bone spur    Left hip  . Endometriosis   . Hyperlipidemia   . 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   WBC 3,000 08/30/11 52 P 43 L  . Migraine without aura   . Vitamin D deficiency     Past Surgical History:  Procedure Laterality Date  . APPENDECTOMY  1965   7th grade in the '60's  . CESAREAN SECTION     x2 '84 & '85  . DIAGNOSTIC LAPAROSCOPY    . TOTAL ABDOMINAL HYSTERECTOMY W/ BILATERAL SALPINGOOPHORECTOMY  08/1989   endometriosis    There were no vitals filed for this visit.  Subjective Assessment - 11/27/17 1007    Subjective  Patient stated she has an ear infection but back and hips are feeling okay with no pain.    Limitations  Sitting    Diagnostic tests  X-Ray    Patient Stated Goals  decrease pain, improve movement, improve strength    Currently in Pain?  No/denies         [facility] PT Assessment - 11/27/17 0001      Assessment   Medical Diagnosis  Low back pain, Bilateral IT band    Next MD Visit  March 8    Prior Therapy  No                  [facility] Adult PT Treatment/Exercise - 11/27/17 0001      Lumbar Exercises: Standing   Row  Strengthening;Both;20 reps;Limitations    Row Limitations  Pink XTS    Shoulder Extension  Strengthening;Both;20 reps;Limitations    Shoulder Extension  Limitations  Pink XTS      Lumbar Exercises: Supine   Bent Knee Raise  20 reps;3 seconds      Knee/Hip Exercises: Aerobic   Nustep  Level 4 x 12 minutes.      Knee/Hip Exercises: Standing   Other Standing Knee Exercises  lateral band walking in tiled hall way x2 wiht Red TB      Knee/Hip Exercises: Supine   Bridges  Strengthening;Both;3 sets;10 reps    Straight Leg Raise with External Rotation  Strengthening;Both;2 sets;10 reps      Knee/Hip Exercises: Sidelying   Clams  red theraband 2x10 each      Moist Heat Therapy   Number Minutes Moist Heat  10 Minutes      Electrical Stimulation   Electrical Stimulation Location  Left low back/ gluteal region    Electrical Stimulation Action  Premod 2 channels; left hip and low back    Electrical Stimulation Parameters  80-150 hz x10    Electrical Stimulation Goals  Pain  PT Short Term Goals - 11/12/17 1035      PT SHORT TERM GOAL #1   Title  Patient will be independent with HEP    Time  2    Period  Weeks    Status  Achieved        PT Long Term Goals - 11/12/17 1035      PT LONG TERM GOAL #1   Title  Patient will decrease at worst pain to less than or equal to 3/10 in order perform household activities.    Time  6    Period  Weeks    Status  On-going      PT LONG TERM GOAL #2   Title  Patient will increase bilateral hip flexion, extension, and abduction strength to 5/5 in order to perform functional activities.    Time  6    Period  Weeks    Status  On-going      PT LONG TERM GOAL #3   Title  Patient will sit comfortably for greater than 1 hour with less than or equal to 1/10 pain.    Time  6    Period  Weeks    Status  On-going      PT LONG TERM GOAL #4   Title  Patient will demonstrate proper lifting mechanics to protect back while lifting grandchild.    Time  6    Period  Weeks    Status  On-going            Plan - 11/27/17 1028    Clinical Impression Statement  Patient was able  to complete treatment with minimal "pulling" in left glute. Patient educated exercises are strengthening the pulling muscle and should not be concerned if there is muscle fatigue or soreness and end of day or tomorrow. Patient requires multiple verbal and tactile cuing for proper form during rows and lateral band walking.  Normal response to modalities upon end of session.     Clinical Presentation  Stable    Clinical Decision Making  Low    Rehab Potential  Good    PT Frequency  2x / week    PT Duration  6 weeks    PT Treatment/Interventions  ADLs/Self Care Home Management;Electrical Stimulation;Cryotherapy;Ultrasound;Moist Heat;Iontophoresis 4mg /ml Dexamethasone;Gait training;Stair training;Therapeutic activities;Therapeutic exercise;Patient/family education;Neuromuscular re-education;Balance training;Manual techniques;Dry needling;Passive range of motion    PT Next Visit Plan  Core stabilization, hip strengthening and modalities PRN. MD 11/30/17    Consulted and Agree with Plan of Care  Patient       Patient will benefit from skilled therapeutic intervention in order to improve the following deficits and impairments:  Pain, Decreased range of motion, Decreased strength, Postural dysfunction  Visit Diagnosis: Chronic midline low back pain, with sciatica presence unspecified  Pain in left hip  Pain in right hip     Problem List Patient Active Problem List   Diagnosis Date Noted  . Shingles 05/11/2014  . Vitamin D deficiency 09/02/2013  . Hyperlipidemia 09/02/2013  . 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   Gabriela Eves, PT, DPT 11/27/2017, 10:43 AM  [facility] [address] [place], [place], [address] Phone: [phone]   Fax:  [phone]  Name: Krista Henderson MRN: [id number] Date of Birth: 12-Nov-1950

## 2017-11-29 ENCOUNTER — Telehealth: Payer: Self-pay | Admitting: Family Medicine

## 2017-11-29 ENCOUNTER — Encounter: Payer: Medicare HMO | Admitting: Physical Therapy

## 2017-11-29 MED ORDER — AMOXICILLIN 875 MG PO TABS: 875.0000 mg | ORAL_TABLET | Freq: Two times a day (BID) | ORAL | 0 refills | Status: DC

## 2017-11-29 NOTE — Telephone Encounter (Signed)
See below

## 2017-11-29 NOTE — Telephone Encounter (Signed)
Amoxicillin sent in

## 2017-11-29 NOTE — Addendum Note (Signed)
Addended by: Eustaquio Maize on: 11/29/2017 01:27 PM   Modules accepted: Orders

## 2017-11-29 NOTE — Telephone Encounter (Signed)
Pt notified of RX change Verbalizes understanding

## 2017-11-29 NOTE — Telephone Encounter (Signed)
Could antibiotic be changed

## 2017-12-04 ENCOUNTER — Ambulatory Visit: Payer: Medicare HMO | Admitting: Physical Therapy

## 2017-12-04 DIAGNOSIS — M545 Low back pain: Principal | ICD-10-CM

## 2017-12-04 DIAGNOSIS — G8929 Other chronic pain: Secondary | ICD-10-CM

## 2017-12-04 DIAGNOSIS — M25551 Pain in right hip: Secondary | ICD-10-CM

## 2017-12-04 DIAGNOSIS — M25552 Pain in left hip: Secondary | ICD-10-CM | POA: Diagnosis not present

## 2017-12-04 NOTE — Therapy (Signed)
[facility] [place], [place], [address] Phone: [phone]   Fax:  [phone]  Physical Therapy Treatment  Patient Details  Name: Krista Henderson MRN: [id number] Date of Birth: 1950/11/16 Referring Provider: Fredonia Highland,   Encounter Date: 12/04/2017  PT End of Session - 12/04/17 1519    Visit Number  10    Number of Visits  12    Date for PT Re-Evaluation  12/06/17    PT Start Time  3710    PT Stop Time  1606    PT Time Calculation (min)  49 min    Activity Tolerance  Patient tolerated treatment well    Behavior During Therapy  [facility] for tasks assessed/performed       Past Medical History:  Diagnosis Date  . Bone spur    Left hip  . Endometriosis   . Hyperlipidemia   . 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   WBC 3,000 08/30/11 52 P 43 L  . Migraine without aura   . Vitamin D deficiency     Past Surgical History:  Procedure Laterality Date  . APPENDECTOMY  1965   7th grade in the '60's  . CESAREAN SECTION     x2 '84 & '85  . DIAGNOSTIC LAPAROSCOPY    . TOTAL ABDOMINAL HYSTERECTOMY W/ BILATERAL SALPINGOOPHORECTOMY  08/1989   endometriosis    There were no vitals filed for this visit.  Subjective Assessment - 12/04/17 1525    Subjective  Patient reported her back was sore after picking up her 20 lb grand child over the weekend but it feels better today after rest. Patient is seeing her doctor next wednesday for a follow up.    Limitations  Sitting    Diagnostic tests  X-Ray    Patient Stated Goals  decrease pain, improve movement, improve strength    Currently in Pain?  No/denies         [facility] PT Assessment - 12/04/17 0001      Assessment   Medical Diagnosis  Low back pain, Bilateral IT band    Next MD Visit  March 8    Prior Therapy  No                  [facility] Adult PT Treatment/Exercise - 12/04/17 0001      Lumbar Exercises: Stretches   Lower Trunk Rotation  5 reps;20 seconds      Lumbar  Exercises: Supine   Bent Knee Raise  20 reps;3 seconds    Bridge with [employer]  20 reps      Knee/Hip Exercises: Public affairs consultant  2 reps;20 seconds      Knee/Hip Exercises: Aerobic   Nustep  Level 4 x 12 minutes.      Knee/Hip Exercises: Machines for Strengthening   Cybex Knee Extension  2x10 20#    Cybex Knee Flexion  2x10 20#      Knee/Hip Exercises: Standing   Hip Abduction  AROM;Stengthening;Both;20 reps;Knee straight      Moist Heat Therapy   Number Minutes Moist Heat  10 Minutes    Moist Heat Location  Lumbar Spine      Electrical Stimulation   Electrical Stimulation Location  Left low back/ gluteal region    Electrical Stimulation Action  IFC    Electrical Stimulation Parameters  80-150 hz x10 mins    Electrical Stimulation Goals  Pain  PT Short Term Goals - 11/12/17 1035      PT SHORT TERM GOAL #1   Title  Patient will be independent with HEP    Time  2    Period  Weeks    Status  Achieved        PT Long Term Goals - 11/12/17 1035      PT LONG TERM GOAL #1   Title  Patient will decrease at worst pain to less than or equal to 3/10 in order perform household activities.    Time  6    Period  Weeks    Status  On-going      PT LONG TERM GOAL #2   Title  Patient will increase bilateral hip flexion, extension, and abduction strength to 5/5 in order to perform functional activities.    Time  6    Period  Weeks    Status  On-going      PT LONG TERM GOAL #3   Title  Patient will sit comfortably for greater than 1 hour with less than or equal to 1/10 pain.    Time  6    Period  Weeks    Status  On-going      PT LONG TERM GOAL #4   Title  Patient will demonstrate proper lifting mechanics to protect back while lifting grandchild.    Time  6    Period  Weeks    Status  On-going            Plan - 12/04/17 1554    Clinical Impression Statement  Patient was able to complete exercises with no increase of pain, just  muscle fatigue. Patient attempted bent knee raise with up, up, down, down progression however patient was unable to perform correctly. Exercise reverted to bent knee marching. Address goals, objective measures, and FOTO over next two visits. Normal response to modalities upon removal.    Clinical Presentation  Stable    Clinical Decision Making  Low    Rehab Potential  Good    PT Frequency  2x / week    PT Duration  6 weeks    PT Treatment/Interventions  ADLs/Self Care Home Management;Electrical Stimulation;Cryotherapy;Ultrasound;Moist Heat;Iontophoresis 4mg /ml Dexamethasone;Gait training;Stair training;Therapeutic activities;Therapeutic exercise;Patient/family education;Neuromuscular re-education;Balance training;Manual techniques;Dry needling;Passive range of motion    PT Next Visit Plan  Core stabilization, hip strengthening and modalities PRN. MD 12/12/17    Consulted and Agree with Plan of Care  Patient       Patient will benefit from skilled therapeutic intervention in order to improve the following deficits and impairments:  Pain, Decreased range of motion, Decreased strength, Postural dysfunction  Visit Diagnosis: Chronic midline low back pain, with sciatica presence unspecified  Pain in left hip  Pain in right hip     Problem List Patient Active Problem List   Diagnosis Date Noted  . Shingles 05/11/2014  . Vitamin D deficiency 09/02/2013  . Hyperlipidemia 09/02/2013  . 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   Gabriela Eves, PT, DPT 12/04/2017, 6:56 PM  [facility] [address] [place], [place], [address] Phone: [phone]   Fax:  [phone]  Name: Krista Henderson MRN: [id number] Date of Birth: Nov 21, 1950

## 2017-12-06 ENCOUNTER — Ambulatory Visit: Payer: Medicare HMO | Admitting: Physical Therapy

## 2017-12-06 DIAGNOSIS — M25552 Pain in left hip: Secondary | ICD-10-CM

## 2017-12-06 DIAGNOSIS — M545 Low back pain: Secondary | ICD-10-CM | POA: Diagnosis not present

## 2017-12-06 DIAGNOSIS — G8929 Other chronic pain: Secondary | ICD-10-CM

## 2017-12-06 DIAGNOSIS — M25551 Pain in right hip: Secondary | ICD-10-CM | POA: Diagnosis not present

## 2017-12-06 NOTE — Therapy (Signed)
[facility] [place], [place], [address] Phone: [phone]   Fax:  [phone]  Physical Therapy Treatment  Patient Details  Name: Krista Henderson MRN: [id number] Date of Birth: 09/07/1951 Referring Provider: Fredonia Highland,   Encounter Date: 12/06/2017  PT End of Session - 12/06/17 1032    Visit Number  11    Number of Visits  12    Date for PT Re-Evaluation  12/06/17    PT Start Time  1030    PT Stop Time  1128    PT Time Calculation (min)  58 min    Activity Tolerance  Patient tolerated treatment well    Behavior During Therapy  [facility] for tasks assessed/performed       Past Medical History:  Diagnosis Date  . Bone spur    Left hip  . Endometriosis   . Hyperlipidemia   . 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   WBC 3,000 08/30/11 52 P 43 L  . Migraine without aura   . Vitamin D deficiency     Past Surgical History:  Procedure Laterality Date  . APPENDECTOMY  1965   7th grade in the '60's  . CESAREAN SECTION     x2 '84 & '85  . DIAGNOSTIC LAPAROSCOPY    . TOTAL ABDOMINAL HYSTERECTOMY W/ BILATERAL SALPINGOOPHORECTOMY  08/1989   endometriosis    There were no vitals filed for this visit.  Subjective Assessment - 12/06/17 1038    Subjective  Patient reported her back is more sore on the right side rather than the left. She stated she was doing a lot of house work as well.    Limitations  Sitting    Diagnostic tests  X-Ray    Patient Stated Goals  decrease pain, improve movement, improve strength    Pain Onset  More than a month ago         [facility] PT Assessment - 12/06/17 0001      Assessment   Medical Diagnosis  Low back pain, Bilateral IT band    Next MD Visit  March 8    Prior Therapy  No      Observation/Other Assessments   Focus on Therapeutic Outcomes (FOTO)   31% limitation                  [facility] Adult PT Treatment/Exercise - 12/06/17 0001      Lumbar Exercises: Standing   Row   Strengthening;Both;20 reps;Limitations    Row Limitations  Pink XTS    Shoulder Extension  Strengthening;Both;20 reps;Limitations    Shoulder Extension Limitations  Pink XTS      Lumbar Exercises: Supine   Bridge with [employer]  20 reps      Knee/Hip Exercises: Stretches   Passive Hamstring Stretch  Left;3 reps;30 seconds    ITB Stretch  Left;3 reps;30 seconds      Knee/Hip Exercises: Aerobic   Nustep  Level 4 x 12 minutes.      Knee/Hip Exercises: Machines for Strengthening   Cybex Leg Press  2x10 1 plate      Knee/Hip Exercises: Standing   Other Standing Knee Exercises  lateral band walking in tiled hall way x2 wiht Red TB      Knee/Hip Exercises: Supine   Straight Leg Raise with External Rotation  Strengthening;Both;2 sets;10 reps      Moist Heat Therapy   Number Minutes Moist Heat  10 Minutes    Moist Heat Location  Lumbar Spine  Acupuncturist Location  Left low back/ gluteal region    Electrical Stimulation Action  IFC    Electrical Stimulation Parameters  80-150 hz x10    Electrical Stimulation Goals  Pain             PT Education - 12/06/17 1134    Education provided  Yes    Education Details  scapular retractions,  LTR, ITB stretch,     Person(s) Educated  Patient    Methods  Explanation;Demonstration;Verbal cues;Tactile cues;Handout    Comprehension  Verbalized understanding;Returned demonstration       PT Short Term Goals - 11/12/17 1035      PT SHORT TERM GOAL #1   Title  Patient will be independent with HEP    Time  2    Period  Weeks    Status  Achieved        PT Long Term Goals - 11/12/17 1035      PT LONG TERM GOAL #1   Title  Patient will decrease at worst pain to less than or equal to 3/10 in order perform household activities.    Time  6    Period  Weeks    Status  On-going      PT LONG TERM GOAL #2   Title  Patient will increase bilateral hip flexion, extension, and abduction strength to  5/5 in order to perform functional activities.    Time  6    Period  Weeks    Status  On-going      PT LONG TERM GOAL #3   Title  Patient will sit comfortably for greater than 1 hour with less than or equal to 1/10 pain.    Time  6    Period  Weeks    Status  On-going      PT LONG TERM GOAL #4   Title  Patient will demonstrate proper lifting mechanics to protect back while lifting grandchild.    Time  6    Period  Weeks    Status  On-going            Plan - 12/06/17 1047    Clinical Impression Statement  Patient continues to require verbal and tactile cuing for proper form during rows with pink XTS. Patient instructed not to use momentum to come to sitting from supine to protect the back. Patient reported agreement and later demonstrated proper bed mobility technique when coming from supine to sit. Pt provided with HEP consisting of lower trunk rotations, ITB stretch supine, lumbar flexion stretch, and scapular retractions. Patient reported understanding. Normal response to modalties upon removal. FOTO limitation: 31%    Clinical Presentation  Stable    Clinical Decision Making  Low    Rehab Potential  Good    PT Frequency  2x / week    PT Duration  6 weeks    PT Treatment/Interventions  ADLs/Self Care Home Management;Electrical Stimulation;Cryotherapy;Ultrasound;Moist Heat;Iontophoresis 4mg /ml Dexamethasone;Gait training;Stair training;Therapeutic activities;Therapeutic exercise;Patient/family education;Neuromuscular re-education;Balance training;Manual techniques;Dry needling;Passive range of motion    PT Next Visit Plan  Assess goals Core stabilization, hip strengthening and modalities PRN. MD 12/12/17. Send note to MD    Consulted and Agree with Plan of Care  Patient       Patient will benefit from skilled therapeutic intervention in order to improve the following deficits and impairments:  Pain, Decreased range of motion, Decreased strength, Postural dysfunction  Visit  Diagnosis: Chronic midline low back pain, with  sciatica presence unspecified  Pain in left hip  Pain in right hip     Problem List Patient Active Problem List   Diagnosis Date Noted  . Shingles 05/11/2014  . Vitamin D deficiency 09/02/2013  . Hyperlipidemia 09/02/2013  . 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    Gabriela Eves, PT, DPT 12/06/2017, 1:28 PM  [facility] [address] [place], [place], [address] Phone: [phone]   Fax:  [phone]  Name: Krista Henderson MRN: [id number] Date of Birth: [birth year]

## 2017-12-06 NOTE — Patient Instructions (Signed)
   Abaigeal Moomaw, PT, DPT [facility] [address] [place], [place], [address] Phone: [phone]   Fax:  [phone]  

## 2017-12-10 ENCOUNTER — Telehealth: Payer: Self-pay | Admitting: Family Medicine

## 2017-12-10 NOTE — Telephone Encounter (Signed)
Discussed symptoms of C Diff with patient. She states that her diarrhea has been better yesterday and today and that she has finished all her medication. Advised that if she wasn't some better by tomorrow to come by and pick up stool culture cups for C-Diff. Patient verbalized understanding.

## 2017-12-10 NOTE — Telephone Encounter (Signed)
Pt c/o of diarrhea and has been on 2 different rounds of abx. Pt has some questions on C diff. Please advise.

## 2017-12-11 ENCOUNTER — Other Ambulatory Visit: Payer: Medicare HMO

## 2017-12-11 ENCOUNTER — Ambulatory Visit: Payer: Medicare HMO | Admitting: Physical Therapy

## 2017-12-11 DIAGNOSIS — M545 Low back pain: Principal | ICD-10-CM

## 2017-12-11 DIAGNOSIS — M25552 Pain in left hip: Secondary | ICD-10-CM | POA: Diagnosis not present

## 2017-12-11 DIAGNOSIS — M25551 Pain in right hip: Secondary | ICD-10-CM | POA: Diagnosis not present

## 2017-12-11 DIAGNOSIS — R300 Dysuria: Secondary | ICD-10-CM

## 2017-12-11 DIAGNOSIS — G8929 Other chronic pain: Secondary | ICD-10-CM

## 2017-12-11 LAB — MICROSCOPIC EXAMINATION
Bacteria, UA: NONE SEEN
RBC, UA: NONE SEEN /hpf (ref 0–2)
Renal Epithel, UA: NONE SEEN /hpf

## 2017-12-11 LAB — URINALYSIS, COMPLETE
Bilirubin, UA: NEGATIVE
Glucose, UA: NEGATIVE
Ketones, UA: NEGATIVE
Nitrite, UA: NEGATIVE
Protein, UA: NEGATIVE
RBC, UA: NEGATIVE
Specific Gravity, UA: 1.025 (ref 1.005–1.030)
Urobilinogen, Ur: 0.2 mg/dL (ref 0.2–1.0)
pH, UA: 6 (ref 5.0–7.5)

## 2017-12-11 NOTE — Patient Instructions (Signed)
   Janari Gagner, PT, DPT [facility] [address] [place], [place], [address] Phone: [phone]   Fax:  [phone]  

## 2017-12-11 NOTE — Addendum Note (Signed)
Addended by: Gabriela Eves on: 12/11/2017 12:36 PM   Modules accepted: Orders

## 2017-12-11 NOTE — Therapy (Addendum)
[facility] [place], [place], [address] Phone: [phone]   Fax:  [phone]  Physical Therapy Treatment/Discharge   Patient Details  Name: Krista Henderson MRN: [id number] Date of Birth: 06-12-1951 Referring Provider: Fredonia Highland,   Encounter Date: 12/11/2017  PT End of Session - 12/11/17 0946    Visit Number  12    Number of Visits  16    Date for PT Re-Evaluation  01/01/18    PT Start Time  0945    PT Stop Time  1043    PT Time Calculation (min)  58 min    Activity Tolerance  Patient tolerated treatment well    Behavior During Therapy  [facility] for tasks assessed/performed       Past Medical History:  Diagnosis Date  . Bone spur    Left hip  . Endometriosis   . Hyperlipidemia   . 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   WBC 3,000 08/30/11 52 P 43 L  . Migraine without aura   . Vitamin D deficiency     Past Surgical History:  Procedure Laterality Date  . APPENDECTOMY  1965   7th grade in the '60's  . CESAREAN SECTION     x2 '84 & '85  . DIAGNOSTIC LAPAROSCOPY    . TOTAL ABDOMINAL HYSTERECTOMY W/ BILATERAL SALPINGOOPHORECTOMY  08/1989   endometriosis    There were no vitals filed for this visit.  Subjective Assessment - 12/11/17 0946    Subjective  Patient reported feeling pretty good. She states she will see the doctor tomorrow for a follow up. She stated she has not been able to walk due to a cyst removed from her ear and ongoing diahrrea secondary to antibiotics.     Limitations  Sitting    Diagnostic tests  X-Ray    Patient Stated Goals  decrease pain, improve movement, improve strength    Currently in Pain?  Yes    Pain Score  2     Pain Location  Hip         [facility] PT Assessment - 12/11/17 0001      Assessment   Medical Diagnosis  Low back pain, Bilateral IT band    Next MD Visit  March 27/19    Prior Therapy  No            No data recorded       [facility] Adult PT Treatment/Exercise - 12/11/17  0001      Lumbar Exercises: Supine   Bridge with Ball Squeeze  20 reps      Knee/Hip Exercises: Aerobic   Nustep  Level 5 x 12 minutes.      Knee/Hip Exercises: Machines for Strengthening   Cybex Knee Extension  2x10 10#    Cybex Knee Flexion  3x10 20#      Knee/Hip Exercises: Standing   Other Standing Knee Exercises  14# box lift from floor to plinth x10    Other Standing Knee Exercises  14# box carry up/down 4 steps x4 times      Knee/Hip Exercises: Supine   Straight Leg Raise with External Rotation  Strengthening;Both;2 sets;10 reps      Moist Heat Therapy   Number Minutes Moist Heat  10 Minutes    Moist Heat Location  Lumbar Spine      Electrical Stimulation   Electrical Stimulation Location  Left low back/ gluteal region    Electrical Stimulation Action  IFC    Electrical Stimulation Parameters  80-150  hz x10 min    Electrical Stimulation Goals  Pain             PT Education - 12/11/17 1037    Education provided  Yes    Education Details  lifting mechanics, golfers pick up, lumbar support    Person(s) Educated  Patient    Methods  Explanation;Demonstration;Handout    Comprehension  Verbalized understanding;Returned demonstration       PT Short Term Goals - 11/12/17 1035      PT SHORT TERM GOAL #1   Title  Patient will be independent with HEP    Time  2    Period  Weeks    Status  Achieved        PT Long Term Goals - 12/11/17 0950      PT LONG TERM GOAL #1   Title  Patient will decrease at worst pain to less than or equal to 3/10 in order perform household activities.    Status  Achieved 0/10      PT LONG TERM GOAL #2   Title  Patient will increase bilateral hip flexion, extension, and abduction strength to 5/5 in order to perform functional activities.    Time  6    Period  Weeks    Status  On-going      PT LONG TERM GOAL #3   Title  Patient will sit comfortably for greater than 1 hour with less than or equal to 1/10 pain.    Time  6     Status  On-going 3/10      PT LONG TERM GOAL #4   Title  Patient will demonstrate proper lifting mechanics to protect back while lifting grandchild.    Time  6    Period  Weeks    Status  On-going            Plan - 12/11/17 5465    Clinical Impression Statement  Patient was able to complete treatment with no increase of pain. Patient demonstrates lifting object from floor with back rather than knees and hips. Patient educated on proper mechanics and was able to lift with proper mechanics 75% of the remaining repetitions. Patient met some goals; others ongoing. MD note routed to request 4 additional visits to address proper mechanics with lifting and ADLs. Pt in agreement. Normal response to modalities upon removal.    Clinical Presentation  Stable    Clinical Decision Making  Low    Rehab Potential  Good    PT Frequency  2x / week    PT Duration  6 weeks    PT Treatment/Interventions  ADLs/Self Care Home Management;Electrical Stimulation;Cryotherapy;Ultrasound;Moist Heat;Iontophoresis 2m/ml Dexamethasone;Gait training;Stair training;Therapeutic activities;Therapeutic exercise;Patient/family education;Neuromuscular re-education;Balance training;Manual techniques;Dry needling;Passive range of motion    PT Next Visit Plan  Assess lifting mechanics. Continue core strengthening with emphasis on proper lifting body mechanics.    PT Home Exercise Plan  Draw ins, supine marching, sidelying ITB stretch     Consulted and Agree with Plan of Care  Patient       Patient will benefit from skilled therapeutic intervention in order to improve the following deficits and impairments:  Pain, Decreased range of motion, Decreased strength, Postural dysfunction  Visit Diagnosis: Chronic midline low back pain, with sciatica presence unspecified  Pain in left hip  Pain in right hip     Problem List Patient Active Problem List   Diagnosis Date Noted  . Shingles 05/11/2014  . Vitamin D deficiency  09/02/2013  . Hyperlipidemia 09/02/2013  . 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   PHYSICAL THERAPY DISCHARGE SUMMARY  Visits from Start of Care: 12  Current functional level related to goals / functional outcomes: See above   Remaining deficits: See goals   Education / Equipment: HEP Plan: Patient agrees to discharge.  Patient goals were partially met. Patient is being discharged due to not returning since the last visit.  ?????       Gabriela Eves, PT, DPT 12/11/2017, 12:35 PM  [facility] [address] [place], [place], [address] Phone: [phone]   Fax:  [phone]  Name: KATTALEYA ALIA MRN: [id number] Date of Birth: 02-04-1951

## 2017-12-12 DIAGNOSIS — M25551 Pain in right hip: Secondary | ICD-10-CM | POA: Diagnosis not present

## 2017-12-13 LAB — URINE CULTURE

## 2017-12-14 ENCOUNTER — Other Ambulatory Visit: Payer: Self-pay | Admitting: *Deleted

## 2017-12-14 MED ORDER — CIPROFLOXACIN HCL 500 MG PO TABS: 500.0000 mg | ORAL_TABLET | Freq: Two times a day (BID) | ORAL | 0 refills | Status: DC

## 2017-12-26 ENCOUNTER — Other Ambulatory Visit: Payer: Medicare HMO

## 2017-12-26 DIAGNOSIS — Z87440 Personal history of urinary (tract) infections: Secondary | ICD-10-CM | POA: Diagnosis not present

## 2017-12-26 DIAGNOSIS — R8279 Other abnormal findings on microbiological examination of urine: Secondary | ICD-10-CM | POA: Diagnosis not present

## 2017-12-26 LAB — MICROSCOPIC EXAMINATION: Renal Epithel, UA: NONE SEEN /hpf

## 2017-12-26 LAB — URINALYSIS, COMPLETE
Bilirubin, UA: NEGATIVE
Glucose, UA: NEGATIVE
Leukocytes, UA: NEGATIVE
Nitrite, UA: NEGATIVE
Specific Gravity, UA: 1.025 (ref 1.005–1.030)
Urobilinogen, Ur: 0.2 mg/dL (ref 0.2–1.0)
pH, UA: 5.5 (ref 5.0–7.5)

## 2017-12-27 LAB — URINE CULTURE

## 2018-01-02 ENCOUNTER — Other Ambulatory Visit: Payer: Self-pay | Admitting: Obstetrics & Gynecology

## 2018-01-02 ENCOUNTER — Ambulatory Visit: Payer: Medicare HMO | Admitting: Family Medicine

## 2018-01-02 DIAGNOSIS — Z139 Encounter for screening, unspecified: Secondary | ICD-10-CM

## 2018-01-03 ENCOUNTER — Other Ambulatory Visit: Payer: Medicare HMO

## 2018-01-03 DIAGNOSIS — D704 Cyclic neutropenia: Secondary | ICD-10-CM

## 2018-01-03 DIAGNOSIS — N390 Urinary tract infection, site not specified: Secondary | ICD-10-CM

## 2018-01-03 DIAGNOSIS — E782 Mixed hyperlipidemia: Secondary | ICD-10-CM

## 2018-01-03 LAB — URINALYSIS, COMPLETE
Bilirubin, UA: NEGATIVE
Glucose, UA: NEGATIVE
Ketones, UA: NEGATIVE
Leukocytes, UA: NEGATIVE
Nitrite, UA: NEGATIVE
Protein, UA: NEGATIVE
Specific Gravity, UA: 1.02 (ref 1.005–1.030)
Urobilinogen, Ur: 0.2 mg/dL (ref 0.2–1.0)
pH, UA: 5 (ref 5.0–7.5)

## 2018-01-03 LAB — MICROSCOPIC EXAMINATION: Renal Epithel, UA: NONE SEEN /hpf

## 2018-01-04 LAB — CBC WITH DIFFERENTIAL/PLATELET
Basophils Absolute: 0 10*3/uL (ref 0.0–0.2)
Basos: 1 %
EOS (ABSOLUTE): 0.1 10*3/uL (ref 0.0–0.4)
Eos: 2 %
Hematocrit: 38.2 % (ref 34.0–46.6)
Hemoglobin: 12.7 g/dL (ref 11.1–15.9)
Immature Grans (Abs): 0 10*3/uL (ref 0.0–0.1)
Immature Granulocytes: 0 %
Lymphocytes Absolute: 1.3 10*3/uL (ref 0.7–3.1)
Lymphs: 38 %
MCH: 32.6 pg (ref 26.6–33.0)
MCHC: 33.2 g/dL (ref 31.5–35.7)
MCV: 98 fL — ABNORMAL HIGH (ref 79–97)
Monocytes Absolute: 0.2 10*3/uL (ref 0.1–0.9)
Monocytes: 7 %
Neutrophils Absolute: 1.7 10*3/uL (ref 1.4–7.0)
Neutrophils: 52 %
Platelets: 191 10*3/uL (ref 150–379)
RBC: 3.9 x10E6/uL (ref 3.77–5.28)
RDW: 13.4 % (ref 12.3–15.4)
WBC: 3.3 10*3/uL — ABNORMAL LOW (ref 3.4–10.8)

## 2018-01-04 LAB — HEPATIC FUNCTION PANEL
ALT: 10 IU/L (ref 0–32)
AST: 18 IU/L (ref 0–40)
Albumin: 4.5 g/dL (ref 3.6–4.8)
Alkaline Phosphatase: 74 IU/L (ref 39–117)
Bilirubin Total: 1.2 mg/dL (ref 0.0–1.2)
Bilirubin, Direct: 0.24 mg/dL (ref 0.00–0.40)
Total Protein: 6.6 g/dL (ref 6.0–8.5)

## 2018-01-04 LAB — BMP8+EGFR
BUN/Creatinine Ratio: 22 (ref 12–28)
BUN: 16 mg/dL (ref 8–27)
CO2: 20 mmol/L (ref 20–29)
Calcium: 9.1 mg/dL (ref 8.7–10.3)
Chloride: 104 mmol/L (ref 96–106)
Creatinine, Ser: 0.74 mg/dL (ref 0.57–1.00)
GFR calc Af Amer: 98 mL/min/{1.73_m2} (ref >59)
GFR calc non Af Amer: 85 mL/min/{1.73_m2} (ref >59)
Glucose: 102 mg/dL — ABNORMAL HIGH (ref 65–99)
Potassium: 4.3 mmol/L (ref 3.5–5.2)
Sodium: 143 mmol/L (ref 134–144)

## 2018-01-04 LAB — LIPID PANEL
Chol/HDL Ratio: 3.5 ratio (ref 0.0–4.4)
Cholesterol, Total: 232 mg/dL — ABNORMAL HIGH (ref 100–199)
HDL: 67 mg/dL (ref >39)
LDL Calculated: 144 mg/dL — ABNORMAL HIGH (ref 0–99)
Triglycerides: 104 mg/dL (ref 0–149)
VLDL Cholesterol Cal: 21 mg/dL (ref 5–40)

## 2018-01-05 LAB — URINE CULTURE

## 2018-01-09 ENCOUNTER — Encounter: Payer: Self-pay | Admitting: Family Medicine

## 2018-01-09 ENCOUNTER — Ambulatory Visit (INDEPENDENT_AMBULATORY_CARE_PROVIDER_SITE_OTHER): Payer: Medicare HMO | Admitting: Family Medicine

## 2018-01-09 ENCOUNTER — Ambulatory Visit (INDEPENDENT_AMBULATORY_CARE_PROVIDER_SITE_OTHER): Payer: Medicare HMO

## 2018-01-09 VITALS — BP 114/72 | HR 83 | Temp 98.8°F | Ht 64.25 in | Wt 166.0 lb

## 2018-01-09 DIAGNOSIS — Z780 Asymptomatic menopausal state: Secondary | ICD-10-CM

## 2018-01-09 DIAGNOSIS — Z13820 Encounter for screening for osteoporosis: Secondary | ICD-10-CM

## 2018-01-09 DIAGNOSIS — E7800 Pure hypercholesterolemia, unspecified: Secondary | ICD-10-CM

## 2018-01-09 DIAGNOSIS — Z87440 Personal history of urinary (tract) infections: Secondary | ICD-10-CM | POA: Diagnosis not present

## 2018-01-09 DIAGNOSIS — E559 Vitamin D deficiency, unspecified: Secondary | ICD-10-CM

## 2018-01-09 DIAGNOSIS — M85852 Other specified disorders of bone density and structure, left thigh: Secondary | ICD-10-CM | POA: Diagnosis not present

## 2018-01-09 DIAGNOSIS — Z0000 Encounter for general adult medical examination without abnormal findings: Secondary | ICD-10-CM

## 2018-01-09 DIAGNOSIS — M85851 Other specified disorders of bone density and structure, right thigh: Secondary | ICD-10-CM | POA: Diagnosis not present

## 2018-01-09 MED ORDER — ROSUVASTATIN CALCIUM 5 MG PO TABS: 2.5000 mg | ORAL_TABLET | ORAL | 3 refills | Status: DC

## 2018-01-09 NOTE — Progress Notes (Signed)
Subjective:    Patient ID: Krista Henderson, female    DOB: 28-Jun-1951, 67 y.o.   MRN: [id number]  HPI Patient is here today for annual wellness exam and follow up of chronic medical problems which includes hyperlipidemia. She is taking medication regularly.  Patient is doing well overall.  She does complain of a bone spur in her left hip and ongoing allergies.  She is also had recurrent urinary tract infections.  She currently has one nail but is not having any symptoms with it.  She did see the urologist a couple of years ago.  We both decided that we will wait and recheck another urine after she is hydrated well and she gives [place] a clean-catch midstream urine specimen.  She will get a DEXA scan today will be given an FOBT to return and we will review her lab work with her during the visit today.  The recent BMP had a blood sugar that was slightly elevated at 102 and this is been higher in the past.  The creatinine and all the electrolytes including potassium were good.  The urine culture had a low-grade infection of Enterococcus faecalis.  It was sensitive to most antibiotics but doxycycline.  Patient agrees to wait and recheck another urine specimen early next week.  The actual urinalysis however was within normal limits is just that the culture was positive.  The CBC had a white blood cell count that remains slightly decreased and we will continue to monitor that.  The hemoglobin was good at 12.7 the platelet count was adequate.  Cholesterol numbers with traditional lipid testing have a triglyceride is good at 104.  The LDL C cholesterol is elevated higher than it has been over the past several years at 144.  The good cholesterol remains excellent at 67.  All liver function tests are normal.  The patient is currently not taking a statin drug.  The patient denies chest pain shortness of breath trouble swallowing heartburn indigestion nausea vomiting diarrhea or blood in the stool.  No change in bowel  habits.  Next colonoscopy is due in early 2020.  She is currently having no symptoms with voiding.     Patient Active Problem List   Diagnosis Date Noted  . Shingles 05/11/2014  . Vitamin D deficiency 09/02/2013  . Hyperlipidemia 09/02/2013  . 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   Outpatient Encounter Medications as of 01/09/2018  Medication Sig  . Vitamin D, Ergocalciferol, (DRISDOL) 50000 units CAPS capsule TAKE 1 CAPSULE (50,000 UNITS TOTAL) BY MOUTH ONCE A WEEK. (Patient not taking: Reported on 01/09/2018)  . [DISCONTINUED] amoxicillin (AMOXIL) 875 MG tablet Take 1 tablet (875 mg total) by mouth 2 (two) times daily.  . [DISCONTINUED] Ascorbic Acid (VITAMIN C) 100 MG tablet Take 100 mg by mouth daily.  . [DISCONTINUED] benzonatate (TESSALON) 200 MG capsule Take 1 capsule (200 mg total) by mouth 3 (three) times daily as needed for cough.  . [DISCONTINUED] Calcium Carbonate-Vitamin D 600-400 MG-UNIT per tablet Take 1 tablet by mouth daily. With magnesium  . [DISCONTINUED] ciprofloxacin (CIPRO) 500 MG tablet Take 1 tablet (500 mg total) by mouth 2 (two) times daily.   No facility-administered encounter medications on file as of 01/09/2018.      Review of Systems  Constitutional: Negative.   HENT: Negative.        Seasonal allergies   Eyes: Negative.   Respiratory: Negative.   Cardiovascular: Negative.   Gastrointestinal: Negative.   Endocrine: Negative.   Genitourinary: Negative.  Musculoskeletal: Negative.        Bone spur left hip - seen ortho    Skin: Negative.   Allergic/Immunologic: Negative.   Neurological: Negative.   Hematological: Negative.   Psychiatric/Behavioral: Negative.        Objective:   Physical Exam  Constitutional: She is oriented to person, place, and time. She appears well-developed and well-nourished. No distress.  The patient is pleasant and alert.  HENT:  Head: Normocephalic and atraumatic.  Left Ear: External ear normal.  Mouth/Throat: Oropharynx is clear  and moist. No oropharyngeal exudate.  Nasal turbinate congestion bilaterally small external ear canal cyst that patient will have a dermatologist look at again when she has a visit in early May  Eyes: Pupils are equal, round, and reactive to light. Conjunctivae and EOM are normal. Right eye exhibits no discharge. Left eye exhibits no discharge. No scleral icterus.  Neck: Normal range of motion. Neck supple. No thyromegaly present.  No bruits thyromegaly or anterior cervical adenopathy  Cardiovascular: Normal rate, regular rhythm, normal heart sounds and intact distal pulses.  No murmur heard. Heart is regular at 72/min  Pulmonary/Chest: Effort normal and breath sounds normal. No respiratory distress. She has no wheezes. She has no rales.  Clear anteriorly and posteriorly  Abdominal: Soft. Bowel sounds are normal. She exhibits no mass. There is no tenderness. There is no rebound and no guarding.  No abdominal tenderness masses organ enlargement or bruits  Musculoskeletal: Normal range of motion. She exhibits no edema.  Lymphadenopathy:    She has no cervical adenopathy.  Neurological: She is alert and oriented to person, place, and time. She has normal reflexes. No cranial nerve deficit.  Skin: Skin is warm and dry. No rash noted.  Psychiatric: She has a normal mood and affect. Her behavior is normal. Judgment and thought content normal.  Nursing note and vitals reviewed.  BP 114/72 (BP Location: Left Arm)   Pulse 83   Temp 98.8 F (37.1 C) (Oral)   Ht 5' 4.25" (1.632 m)   Wt 166 lb (75.3 kg)   LMP 08/18/1989   BMI 28.27 kg/m   DEXA scan with results pending      Assessment & Plan:  1. Annual physical exam -She is doing well overall.  She is not watching her diet as closely as she should and she will to do better with eating habits and exercise.  She is due to get a colonoscopy in early 2020 because of family history of colon cancer.  2. Pure hypercholesterolemia -Start low-dose  Crestor 5 mg 1/2 tablet on Tuesday and Friday at bedtime repeat lipid panel in 3 months - Lipid panel; Future  3. Vitamin D deficiency -Continue current vitamin D replacement and check vitamin D level in 3 months with lipid panel - VITAMIN D 25 Hydroxy (Vit-D Deficiency, Fractures); Future  4. Recent urinary tract infection -Recheck urinalysis next week after hydrating well and changing habits of cleansing. - Urine Culture; Future  Meds ordered this encounter  Medications  . rosuvastatin (CRESTOR) 5 MG tablet    Sig: Take 0.5 tablets (2.5 mg total) by mouth 2 (two) times a week.    Dispense:  30 tablet    Refill:  3   Patient Instructions  Continue current medications. Continue good therapeutic lifestyle changes which include good diet and exercise. Fall precautions discussed with patient. If an FOBT was given today- please return it to our front desk. If you are over 40 years old - you  may need Prevnar 13 or the adult Pneumonia vaccine.  **Flu shots are available--- please call and schedule a FLU-CLINIC appointment**  After your visit with [place] today you will receive a survey in the mail or online from [employer] regarding your care with [place]. Please take a moment to fill this out. Your feedback is very important to [place] as you can help [place] better understand your patient needs as well as improve your experience and satisfaction. WE CARE ABOUT YOU!!!   Return to clinic in 3 months and recheck a fasting lipid panel along with a vitamin D level Continue to drink plenty of fluids and stay well-hydrated Repeat urinalysis early next week after good hydration through the weekend with water Make every effort to follow your diet more closely and watch for things to have elevated cholesterol in the immune your diet and get more exercise and lose weight Take one half of a 5 mg Crestor twice weekly at bedtime on Tuesday and Friday and repeat lipid panel in 3 months along with a vitamin D level. Try  to eat more healthy and get more exercise  Arrie Senate MD

## 2018-01-09 NOTE — Patient Instructions (Addendum)
Continue current medications. Continue good therapeutic lifestyle changes which include good diet and exercise. Fall precautions discussed with patient. If an FOBT was given today- please return it to our front desk. If you are over 67 years old - you may need Prevnar 20 or the adult Pneumonia vaccine.  **Flu shots are available--- please call and schedule a FLU-CLINIC appointment**  After your visit with [place] today you will receive a survey in the mail or online from [employer] regarding your care with [place]. Please take a moment to fill this out. Your feedback is very important to [place] as you can help [place] better understand your patient needs as well as improve your experience and satisfaction. WE CARE ABOUT YOU!!!   Return to clinic in 3 months and recheck a fasting lipid panel along with a vitamin D level Continue to drink plenty of fluids and stay well-hydrated Repeat urinalysis early next week after good hydration through the weekend with water Make every effort to follow your diet more closely and watch for things to have elevated cholesterol in the immune your diet and get more exercise and lose weight Take one half of a 5 mg Crestor twice weekly at bedtime on Tuesday and Friday and repeat lipid panel in 3 months along with a vitamin D level. Try to eat more healthy and get more exercise

## 2018-01-24 ENCOUNTER — Ambulatory Visit: Payer: Medicare HMO

## 2018-01-29 ENCOUNTER — Ambulatory Visit
Admission: RE | Admit: 2018-01-29 | Discharge: 2018-01-29 | Disposition: A | Discharge status: Home / Self Care | Payer: Medicare HMO | Source: Ambulatory Visit | Attending: Obstetrics & Gynecology | Admitting: Obstetrics & Gynecology

## 2018-01-29 DIAGNOSIS — Z139 Encounter for screening, unspecified: Secondary | ICD-10-CM

## 2018-01-29 DIAGNOSIS — Z1231 Encounter for screening mammogram for malignant neoplasm of breast: Secondary | ICD-10-CM | POA: Diagnosis not present

## 2018-02-04 DIAGNOSIS — L820 Inflamed seborrheic keratosis: Secondary | ICD-10-CM | POA: Diagnosis not present

## 2018-02-04 DIAGNOSIS — B070 Plantar wart: Secondary | ICD-10-CM | POA: Diagnosis not present

## 2018-02-04 DIAGNOSIS — L905 Scar conditions and fibrosis of skin: Secondary | ICD-10-CM | POA: Diagnosis not present

## 2018-02-04 DIAGNOSIS — Z1283 Encounter for screening for malignant neoplasm of skin: Secondary | ICD-10-CM | POA: Diagnosis not present

## 2018-02-05 ENCOUNTER — Other Ambulatory Visit: Payer: Self-pay

## 2018-02-05 ENCOUNTER — Encounter: Payer: Self-pay | Admitting: Obstetrics & Gynecology

## 2018-02-05 ENCOUNTER — Ambulatory Visit (INDEPENDENT_AMBULATORY_CARE_PROVIDER_SITE_OTHER): Payer: Medicare HMO | Admitting: Obstetrics & Gynecology

## 2018-02-05 VITALS — BP 132/86 | HR 80 | Resp 16 | Ht 64.0 in | Wt 166.6 lb

## 2018-02-05 DIAGNOSIS — Z01419 Encounter for gynecological examination (general) (routine) without abnormal findings: Secondary | ICD-10-CM

## 2018-02-05 DIAGNOSIS — Z0000 Encounter for general adult medical examination without abnormal findings: Secondary | ICD-10-CM | POA: Diagnosis not present

## 2018-02-05 DIAGNOSIS — Z87440 Personal history of urinary (tract) infections: Secondary | ICD-10-CM

## 2018-02-05 LAB — POCT URINALYSIS DIPSTICK
Bilirubin, UA: NEGATIVE
Blood, UA: NEGATIVE
Glucose, UA: NEGATIVE
Ketones, UA: NEGATIVE
Leukocytes, UA: NEGATIVE
Nitrite, UA: NEGATIVE
Protein, UA: NEGATIVE
Urobilinogen, UA: 0.2 E.U./dL
pH, UA: 5 (ref 5.0–8.0)

## 2018-02-05 NOTE — Patient Instructions (Signed)
Fem dophilus--probiotic for the bladder

## 2018-02-05 NOTE — Progress Notes (Signed)
67 y.o. [id number] MarriedCaucasianF here for annual exam.  Doing well.  Denies vaginal bleeding.    Having issues with recurrent urinary tract infections.  Recently still had positive urine culture.  Requests having this repeated today.  May need urologist if culture is still positive.  Really wants to be back on estrogen as she thinks all the UTIs are related to not being on estrogen.  Saw dermatologist yesterday.  Has a few places treated on her skin yesterday.    PCP:  Dr. Laurance Flatten.  Just has physical exam 01/09/18.    Patient's last menstrual period was 08/18/1989.          Sexually active: Yes.    The current method of family planning is status post hysterectomy.    Exercising: Yes.    PT Smoker:  no  Health Maintenance: Pap:  10/2000 Normal  History of abnormal Pap:  no MMG:  01/29/18 BIRADS1:Neg  Colonoscopy:  10/13/13 Normal. F/u 5 years  BMD:   01/09/18 Osteopenia  TDaP:  05/2011 Pneumonia vaccine(s):  2018 Shingrix: Zostavax done.  Is considering the Shingrix vaccination.  Hep C testing: 07/30/12 Neg  Screening Labs: PCP  UA: Normal    reports that she has never smoked. She has never used smokeless tobacco. She reports that she does not drink alcohol or use drugs.  Past Medical History:  Diagnosis Date  . Bone spur    Left hip  . Endometriosis   . Hyperlipidemia   . 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   WBC 3,000 08/30/11 52 P 43 L  . Migraine without aura   . Vitamin D deficiency     Past Surgical History:  Procedure Laterality Date  . APPENDECTOMY  1965   7th grade in the '60's  . CESAREAN SECTION     x2 '84 & '85  . DIAGNOSTIC LAPAROSCOPY    . TOTAL ABDOMINAL HYSTERECTOMY W/ BILATERAL SALPINGOOPHORECTOMY  08/1989   endometriosis    Current Outpatient Medications  Medication Sig Dispense Refill  . Multiple Minerals-Vitamins (CITRACAL PLUS PO) Take by mouth daily.    . Vitamin D, Ergocalciferol, (DRISDOL) 50000 units CAPS capsule TAKE 1 CAPSULE (50,000 UNITS TOTAL) BY MOUTH  ONCE A WEEK. 12 capsule 0   No current facility-administered medications for this visit.     Family History  Problem Relation Age of Onset  . Diabetes Mother   . Osteoporosis Mother   . Hypertension Mother   . Colon cancer Father 74  . Stomach cancer Paternal Grandfather   . Breast cancer Paternal Aunt        55's  . Esophageal cancer Neg Hx   . Rectal cancer Neg Hx     Review of Systems  All other systems reviewed and are negative.   Exam:   BP 132/86 (BP Location: Right Arm, Patient Position: Sitting, Cuff Size: Large)   Pulse 80   Resp 16   Ht 5\' 4"  (1.626 m)   Wt 166 lb 9.6 oz (75.6 kg)   LMP 08/18/1989   BMI 28.60 kg/m   Height: 5\' 4"  (162.6 cm)  Ht Readings from Last 3 Encounters:  02/05/18 5\' 4"  (1.626 m)  01/09/18 5' 4.25" (1.632 m)  11/26/17 5' 4.25" (1.632 m)    General appearance: alert, cooperative and appears stated age Head: Normocephalic, without obvious abnormality, atraumatic Neck: no adenopathy, supple, symmetrical, trachea midline and thyroid normal to inspection and palpation Lungs: clear to auscultation bilaterally Breasts: normal appearance, no masses or tenderness Heart: regular rate and  rhythm Abdomen: soft, non-tender; bowel sounds normal; no masses,  no organomegaly Extremities: extremities normal, atraumatic, no cyanosis or edema Skin: Skin color, texture, turgor normal. No rashes or lesions Lymph nodes: Cervical, supraclavicular, and axillary nodes normal. No abnormal inguinal nodes palpated Neurologic: Grossly normal   Pelvic: External genitalia:  no lesions              Urethra:  normal appearing urethra with no masses, tenderness or lesions              Bartholins and Skenes: normal                 Vagina: normal appearing vagina with normal color and discharge, no lesions              Cervix: absent              Pap taken: No. Bimanual Exam:  Uterus:  uterus absent              Adnexa: normal adnexa               Rectovaginal:  Confirms               Anus:  normal sphincter tone, no lesions  Chaperone was present for exam.  A:  Well Woman with normal exam H/O TAH/BSO secondary to endometriosis 12/90 PMP, off HRT Recurrent cystitis Vit D deficiency Family hx of colon cancer, father  P:   Mammogram guidelines reviewed.  Doing 3D. pap smear not indicated Considering restarting Vivelle dot due to recurrent vaginitis Urine culture pending Planning on getting Shingrix vaccination return annually or prn

## 2018-02-05 NOTE — Progress Notes (Signed)
FYI

## 2018-02-06 DIAGNOSIS — H5213 Myopia, bilateral: Secondary | ICD-10-CM | POA: Diagnosis not present

## 2018-02-06 DIAGNOSIS — H04123 Dry eye syndrome of bilateral lacrimal glands: Secondary | ICD-10-CM | POA: Diagnosis not present

## 2018-02-06 DIAGNOSIS — H43813 Vitreous degeneration, bilateral: Secondary | ICD-10-CM | POA: Diagnosis not present

## 2018-02-06 DIAGNOSIS — H0100B Unspecified blepharitis left eye, upper and lower eyelids: Secondary | ICD-10-CM | POA: Diagnosis not present

## 2018-04-18 DIAGNOSIS — R69 Illness, unspecified: Secondary | ICD-10-CM | POA: Diagnosis not present

## 2018-04-23 ENCOUNTER — Other Ambulatory Visit: Payer: Medicare HMO

## 2018-04-23 DIAGNOSIS — Z1212 Encounter for screening for malignant neoplasm of rectum: Secondary | ICD-10-CM

## 2018-04-25 LAB — FECAL OCCULT BLOOD, IMMUNOCHEMICAL: Fecal Occult Bld: NEGATIVE

## 2018-06-18 ENCOUNTER — Telehealth: Payer: Self-pay | Admitting: Family Medicine

## 2018-06-18 ENCOUNTER — Other Ambulatory Visit: Payer: Self-pay | Admitting: Family Medicine

## 2018-06-18 DIAGNOSIS — R61 Generalized hyperhidrosis: Secondary | ICD-10-CM

## 2018-06-18 NOTE — Telephone Encounter (Signed)
Patient aware Krista Henderson is not here today and would like for her to call her Wednesday when she returns.

## 2018-06-19 ENCOUNTER — Telehealth: Payer: Self-pay | Admitting: Family Medicine

## 2018-06-19 NOTE — Telephone Encounter (Signed)
LM 06/19/18

## 2018-06-19 NOTE — Telephone Encounter (Signed)
Please call patient

## 2018-06-20 MED ORDER — VITAMIN D (ERGOCALCIFEROL) 1.25 MG (50000 UNIT) PO CAPS: 50000.0000 [IU] | ORAL_CAPSULE | ORAL | 3 refills | Status: DC

## 2018-06-20 NOTE — Telephone Encounter (Signed)
Get thyroid panel also on this patient.

## 2018-06-20 NOTE — Telephone Encounter (Signed)
done

## 2018-06-20 NOTE — Telephone Encounter (Signed)
Pt calls and states that she is having night sweats and she feels that her neck is slightly swollen looking (per daughter)  She has not had a thyroid panel in the last year =   Ordered thyroid panel, CBC, and urine for pt - she will come in Friday == FYI

## 2018-06-21 ENCOUNTER — Other Ambulatory Visit: Payer: Medicare HMO

## 2018-06-21 DIAGNOSIS — R6889 Other general symptoms and signs: Secondary | ICD-10-CM | POA: Diagnosis not present

## 2018-06-21 DIAGNOSIS — R61 Generalized hyperhidrosis: Secondary | ICD-10-CM | POA: Diagnosis not present

## 2018-06-21 LAB — URINALYSIS, COMPLETE
Bilirubin, UA: NEGATIVE
Glucose, UA: NEGATIVE
Ketones, UA: NEGATIVE
Leukocytes, UA: NEGATIVE
Nitrite, UA: NEGATIVE
Protein, UA: NEGATIVE
Specific Gravity, UA: 1.025 (ref 1.005–1.030)
Urobilinogen, Ur: 0.2 mg/dL (ref 0.2–1.0)
pH, UA: 5 (ref 5.0–7.5)

## 2018-06-21 LAB — MICROSCOPIC EXAMINATION: Renal Epithel, UA: NONE SEEN /hpf

## 2018-06-22 LAB — URINE CULTURE

## 2018-06-22 LAB — CBC WITH DIFFERENTIAL/PLATELET
Basophils Absolute: 0 10*3/uL (ref 0.0–0.2)
Basos: 1 %
EOS (ABSOLUTE): 0.1 10*3/uL (ref 0.0–0.4)
Eos: 2 %
Hematocrit: 37.8 % (ref 34.0–46.6)
Hemoglobin: 12.9 g/dL (ref 11.1–15.9)
Immature Grans (Abs): 0 10*3/uL (ref 0.0–0.1)
Immature Granulocytes: 0 %
Lymphocytes Absolute: 1.4 10*3/uL (ref 0.7–3.1)
Lymphs: 40 %
MCH: 32 pg (ref 26.6–33.0)
MCHC: 34.1 g/dL (ref 31.5–35.7)
MCV: 94 fL (ref 79–97)
Monocytes Absolute: 0.3 10*3/uL (ref 0.1–0.9)
Monocytes: 9 %
Neutrophils Absolute: 1.7 10*3/uL (ref 1.4–7.0)
Neutrophils: 48 %
Platelets: 184 10*3/uL (ref 150–450)
RBC: 4.03 x10E6/uL (ref 3.77–5.28)
RDW: 11.9 % — ABNORMAL LOW (ref 12.3–15.4)
WBC: 3.5 10*3/uL (ref 3.4–10.8)

## 2018-06-22 LAB — THYROID PANEL WITH TSH
Free Thyroxine Index: 2.3 (ref 1.2–4.9)
T3 Uptake Ratio: 26 % (ref 24–39)
T4, Total: 8.7 ug/dL (ref 4.5–12.0)
TSH: 4.46 u[IU]/mL (ref 0.450–4.500)

## 2018-06-25 ENCOUNTER — Telehealth: Payer: Self-pay | Admitting: Family Medicine

## 2018-06-25 NOTE — Telephone Encounter (Signed)
lmtcb

## 2018-06-25 NOTE — Telephone Encounter (Signed)
Refer to pervious phone note

## 2018-07-15 DIAGNOSIS — H61001 Unspecified perichondritis of right external ear: Secondary | ICD-10-CM | POA: Diagnosis not present

## 2018-07-15 DIAGNOSIS — B880 Other acariasis: Secondary | ICD-10-CM | POA: Diagnosis not present

## 2018-07-15 DIAGNOSIS — L708 Other acne: Secondary | ICD-10-CM | POA: Diagnosis not present

## 2018-07-16 ENCOUNTER — Telehealth: Payer: Self-pay | Admitting: Family Medicine

## 2018-07-17 NOTE — Telephone Encounter (Signed)
LMTCB

## 2018-08-08 ENCOUNTER — Ambulatory Visit (INDEPENDENT_AMBULATORY_CARE_PROVIDER_SITE_OTHER): Payer: Medicare HMO | Admitting: *Deleted

## 2018-08-08 DIAGNOSIS — R69 Illness, unspecified: Secondary | ICD-10-CM | POA: Diagnosis not present

## 2018-08-08 DIAGNOSIS — Z23 Encounter for immunization: Secondary | ICD-10-CM | POA: Diagnosis not present

## 2018-08-08 NOTE — Addendum Note (Signed)
Addended by: Marin Olp on: 08/08/2018 03:20 PM   Modules accepted: Orders

## 2018-10-24 ENCOUNTER — Encounter: Payer: Self-pay | Admitting: Gastroenterology

## 2018-10-28 ENCOUNTER — Encounter: Payer: Self-pay | Admitting: Physician Assistant

## 2018-10-28 ENCOUNTER — Encounter: Payer: Self-pay | Admitting: Internal Medicine

## 2018-10-28 ENCOUNTER — Ambulatory Visit (INDEPENDENT_AMBULATORY_CARE_PROVIDER_SITE_OTHER): Payer: Medicare HMO | Admitting: Physician Assistant

## 2018-10-28 VITALS — BP 132/80 | HR 84 | Temp 97.9°F | Ht 64.0 in | Wt 168.4 lb

## 2018-10-28 DIAGNOSIS — H6690 Otitis media, unspecified, unspecified ear: Secondary | ICD-10-CM

## 2018-10-28 DIAGNOSIS — J069 Acute upper respiratory infection, unspecified: Secondary | ICD-10-CM

## 2018-10-28 MED ORDER — AMOXICILLIN 500 MG PO CAPS: 500.0000 mg | ORAL_CAPSULE | Freq: Three times a day (TID) | ORAL | 0 refills | Status: DC

## 2018-10-28 MED ORDER — FLUCONAZOLE 150 MG PO TABS: 150.0000 mg | ORAL_TABLET | Freq: Once | ORAL | 0 refills | Status: AC

## 2018-10-28 NOTE — Progress Notes (Signed)
BP 132/80   Pulse 84   Temp 97.9 F (36.6 C) (Oral)   Ht 5\' 4"  (1.626 m)   Wt 168 lb 6.4 oz (76.4 kg)   LMP 08/18/1989   BMI 28.91 kg/m    Subjective:    Patient ID: Krista Henderson, female    DOB: [birth year], 68 y.o.   MRN: [id number]  HPI: Krista Henderson is a 68 y.o. female presenting on 10/28/2018 for Cough; Ear Pain; and scratchy throat  This patient has had many days of sore throat and postnasal drainage, headache at times and sinus pressure. There is copious drainage at times. Denies any fever at this time. There has been a history of sinus infections in the past.  There is cough at night. It has become more prevalent in recent days. Right er is painful, has history of OM and surgeries  Past Medical History:  Diagnosis Date  . Bone spur 09/2017   Left hip  . Endometriosis   . Hyperlipidemia   . 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   WBC 3,000 08/30/11 52 P 43 L  . Migraine without aura   . Vitamin D deficiency    Relevant past medical, surgical, family and social history reviewed and updated as indicated. Interim medical history since our last visit reviewed. Allergies and medications reviewed and updated. DATA REVIEWED: CHART IN EPIC  Family History reviewed for pertinent findings.  Review of Systems  Constitutional: Positive for chills and fatigue. Negative for activity change, appetite change and fever.  HENT: Positive for congestion, ear pain, postnasal drip and sore throat.   Eyes: Negative.   Respiratory: Positive for cough. Negative for wheezing.   Cardiovascular: Negative.  Negative for chest pain, palpitations and leg swelling.  Gastrointestinal: Negative.   Genitourinary: Negative.   Musculoskeletal: Negative.   Skin: Negative.   Neurological: Positive for headaches.    Allergies as of 10/28/2018      Reactions   Codeine    REACTION: rash/nausea/vomiting   Influenza Vaccines    "injections site turned black color and had a hen size egg lump at site". Per pt she  said not sure if the reactions was from a spider bite or flu shot.     Sulfa Antibiotics Nausea And Vomiting      Medication List       Accurate as of October 28, 2018  2:33 PM. Always use your most recent med list.        amoxicillin 500 MG capsule Commonly known as:  AMOXIL Take 1 capsule (500 mg total) by mouth 3 (three) times daily.   CITRACAL PLUS PO Take by mouth daily.   fluconazole 150 MG tablet Commonly known as:  DIFLUCAN Take 1 tablet (150 mg total) by mouth once for 1 dose.   Vitamin D (Ergocalciferol) 1.25 MG (50000 UT) Caps capsule Commonly known as:  DRISDOL Take 1 capsule (50,000 Units total) by mouth once a week.          Objective:    BP 132/80   Pulse 84   Temp 97.9 F (36.6 C) (Oral)   Ht 5\' 4"  (1.626 m)   Wt 168 lb 6.4 oz (76.4 kg)   LMP 08/18/1989   BMI 28.91 kg/m   Allergies  Allergen Reactions  . Codeine     REACTION: rash/nausea/vomiting  . Influenza Vaccines     "injections site turned black color and had a hen size egg lump at site". Per pt she said not sure if the  reactions was from a spider bite or flu shot.    . Sulfa Antibiotics Nausea And Vomiting    Wt Readings from Last 3 Encounters:  10/28/18 168 lb 6.4 oz (76.4 kg)  02/05/18 166 lb 9.6 oz (75.6 kg)  01/09/18 166 lb (75.3 kg)    Physical Exam Constitutional:      Appearance: She is well-developed.  HENT:     Head: Normocephalic and atraumatic.     Right Ear: Tympanic membrane is erythematous.     Left Ear: Tympanic membrane normal.  Eyes:     Conjunctiva/sclera: Conjunctivae normal.     Pupils: Pupils are equal, round, and reactive to light.  Cardiovascular:     Rate and Rhythm: Normal rate and regular rhythm.     Heart sounds: Normal heart sounds.  Pulmonary:     Effort: Pulmonary effort is normal.     Breath sounds: Normal breath sounds.  Abdominal:     General: Bowel sounds are normal.     Palpations: Abdomen is soft.  Skin:    General: Skin is warm and  dry.     Findings: No rash.  Neurological:     Mental Status: She is alert and oriented to person, place, and time.     Deep Tendon Reflexes: Reflexes are normal and symmetric.  Psychiatric:        Behavior: Behavior normal.        Thought Content: Thought content normal.        Judgment: Judgment normal.         Assessment & Plan:   1. Upper respiratory tract infection, unspecified type - amoxicillin (AMOXIL) 500 MG capsule; Take 1 capsule (500 mg total) by mouth 3 (three) times daily.  Dispense: 30 capsule; Refill: 0  2. Acute otitis media, unspecified otitis media type - amoxicillin (AMOXIL) 500 MG capsule; Take 1 capsule (500 mg total) by mouth 3 (three) times daily.  Dispense: 30 capsule; Refill: 0 - fluconazole (DIFLUCAN) 150 MG tablet; Take 1 tablet (150 mg total) by mouth once for 1 dose.  Dispense: 2 tablet; Refill: 0   Continue all other maintenance medications as listed above.  Follow up plan: No follow-ups on file.  Educational handout given for [place] PA-C [facility] [address]  [place], [place] [address] [phone]   10/28/2018, 2:33 PM

## 2018-11-11 ENCOUNTER — Ambulatory Visit (AMBULATORY_SURGERY_CENTER): Payer: Self-pay | Admitting: *Deleted

## 2018-11-11 VITALS — Ht 64.5 in | Wt 168.0 lb

## 2018-11-11 DIAGNOSIS — Z800 Family history of malignant neoplasm of digestive organs: Secondary | ICD-10-CM

## 2018-11-11 DIAGNOSIS — Z86010 Personal history of colonic polyps: Secondary | ICD-10-CM

## 2018-11-11 MED ORDER — NA SULFATE-K SULFATE-MG SULF 17.5-3.13-1.6 GM/177ML PO SOLN: ORAL | 0 refills | Status: DC

## 2018-11-11 NOTE — Progress Notes (Signed)
Patient denies any allergies to eggs or soy. Patient denies any problems with anesthesia/sedation. Patient denies any oxygen use at home. Patient denies taking any diet/weight loss medications or blood thinners. EMMI education offered, pt declined.  

## 2018-11-18 ENCOUNTER — Encounter: Payer: Self-pay | Admitting: Internal Medicine

## 2018-11-26 ENCOUNTER — Other Ambulatory Visit: Payer: Self-pay | Admitting: Physician Assistant

## 2018-11-26 ENCOUNTER — Telehealth: Payer: Self-pay | Admitting: Family Medicine

## 2018-11-26 MED ORDER — BENZONATATE 200 MG PO CAPS: 200.0000 mg | ORAL_CAPSULE | Freq: Two times a day (BID) | ORAL | 0 refills | Status: DC | PRN

## 2018-11-26 NOTE — Telephone Encounter (Signed)
What symptoms do you have? Seen Particia Nearing 2-20 for earache and now has hacking cough and wants Benzonatate 200 mg called in  How long have you been sick? One week  Have you been seen for this problem? In the past  If your provider decides to give you a prescription, which pharmacy would you like for it to be sent to? [facility]   Patient informed that this information will be sent to the clinical staff for review and that they should receive a follow up call.

## 2018-11-26 NOTE — Telephone Encounter (Signed)
Aware. 

## 2018-11-26 NOTE — Telephone Encounter (Signed)
Sent to [facility]

## 2018-11-28 ENCOUNTER — Encounter: Payer: Medicare HMO | Admitting: Internal Medicine

## 2018-12-02 ENCOUNTER — Encounter: Payer: Medicare HMO | Admitting: Internal Medicine

## 2019-01-14 ENCOUNTER — Encounter: Payer: Medicare HMO | Admitting: Internal Medicine

## 2019-01-15 ENCOUNTER — Telehealth: Payer: Self-pay | Admitting: Family Medicine

## 2019-01-15 ENCOUNTER — Ambulatory Visit: Payer: Medicare HMO | Admitting: Family Medicine

## 2019-01-15 ENCOUNTER — Other Ambulatory Visit: Payer: Self-pay | Admitting: *Deleted

## 2019-01-15 DIAGNOSIS — R300 Dysuria: Secondary | ICD-10-CM

## 2019-01-15 NOTE — Telephone Encounter (Signed)
Aware.  Can do a urine specimen and expect a call when resulted.

## 2019-01-16 ENCOUNTER — Other Ambulatory Visit: Payer: Self-pay

## 2019-01-16 ENCOUNTER — Other Ambulatory Visit: Payer: Self-pay | Admitting: *Deleted

## 2019-01-16 ENCOUNTER — Other Ambulatory Visit: Payer: Medicare HMO

## 2019-01-16 DIAGNOSIS — R300 Dysuria: Secondary | ICD-10-CM

## 2019-01-16 LAB — URINALYSIS, COMPLETE
Bilirubin, UA: NEGATIVE
Glucose, UA: NEGATIVE
Ketones, UA: NEGATIVE
Nitrite, UA: NEGATIVE
Protein,UA: NEGATIVE
RBC, UA: NEGATIVE
Specific Gravity, UA: >1.03 — ABNORMAL HIGH (ref 1.005–1.030)
Urobilinogen, Ur: 0.2 mg/dL (ref 0.2–1.0)
pH, UA: 5 (ref 5.0–7.5)

## 2019-01-16 LAB — MICROSCOPIC EXAMINATION

## 2019-01-16 MED ORDER — CEFDINIR 300 MG PO CAPS: 300.0000 mg | ORAL_CAPSULE | Freq: Two times a day (BID) | ORAL | 0 refills | Status: DC

## 2019-01-16 NOTE — Telephone Encounter (Signed)
Called and pt and she is aware of antibiotic and that culture is still pending

## 2019-01-16 NOTE — Telephone Encounter (Signed)
Pt wants to discuss her labs further about the bacteria in urine and question about rx sent to pharmacy

## 2019-01-18 LAB — URINE CULTURE

## 2019-01-22 ENCOUNTER — Telehealth: Payer: Self-pay | Admitting: *Deleted

## 2019-01-22 NOTE — Telephone Encounter (Signed)
Called patient to reschedule colonoscopy previously cancelled due to Covid-19. Left message for patient to call me, need to verify Rx and redo instructions.

## 2019-01-22 NOTE — Telephone Encounter (Signed)
Pt returned call, colonoscopy scheduled for Friday 02/14/19 at 0930. Pt has prep. Instructions redone and mailed to patient.

## 2019-01-29 ENCOUNTER — Other Ambulatory Visit: Payer: Medicare HMO

## 2019-01-29 ENCOUNTER — Other Ambulatory Visit: Payer: Self-pay

## 2019-01-29 DIAGNOSIS — R300 Dysuria: Secondary | ICD-10-CM | POA: Diagnosis not present

## 2019-01-30 LAB — URINE CULTURE

## 2019-02-03 ENCOUNTER — Telehealth: Payer: Self-pay | Admitting: Family Medicine

## 2019-02-03 NOTE — Telephone Encounter (Signed)
Pt calling back to get lab results please call pt on cell phone

## 2019-02-03 NOTE — Telephone Encounter (Signed)
Aware of results. 

## 2019-02-12 ENCOUNTER — Telehealth: Payer: Self-pay | Admitting: Internal Medicine

## 2019-02-12 ENCOUNTER — Telehealth: Payer: Self-pay | Admitting: *Deleted

## 2019-02-12 NOTE — Telephone Encounter (Signed)
Covid-19 travel screening questions  Have you traveled in the last 14 days?no If yes where?  Do you now or have you had a fever in the last 14 days?no  Do you have any respiratory symptoms of shortness of breath or cough now or in the last 14 days?no  Do you have a medical history of Congestive Heart Failure?  Do you have a medical history of lung disease?  Do you have any family members or close contacts with diagnosed or suspected Covid-19?no  Pt aware of care partner policy and will bring a mask with her if available. SM

## 2019-02-12 NOTE — Telephone Encounter (Signed)
Pt called for suprep instructions-   She thought she had to drink half of 1 bottle- instructed 1 whole  bottle 6 pm  Thursday - 1 whole bottle Friday morning- both followed by 2 - 16 oz cups water- she return verbal understanding

## 2019-02-14 ENCOUNTER — Ambulatory Visit (AMBULATORY_SURGERY_CENTER): Payer: Medicare HMO | Admitting: Internal Medicine

## 2019-02-14 ENCOUNTER — Other Ambulatory Visit: Payer: Self-pay

## 2019-02-14 ENCOUNTER — Encounter: Payer: Self-pay | Admitting: Internal Medicine

## 2019-02-14 VITALS — BP 121/70 | HR 66 | Temp 98.7°F | Resp 19 | Ht 64.5 in | Wt 168.0 lb

## 2019-02-14 DIAGNOSIS — Z800 Family history of malignant neoplasm of digestive organs: Secondary | ICD-10-CM | POA: Diagnosis not present

## 2019-02-14 DIAGNOSIS — D120 Benign neoplasm of cecum: Secondary | ICD-10-CM | POA: Diagnosis not present

## 2019-02-14 DIAGNOSIS — Z860100 Personal history of colon polyps, unspecified: Secondary | ICD-10-CM

## 2019-02-14 DIAGNOSIS — D122 Benign neoplasm of ascending colon: Secondary | ICD-10-CM

## 2019-02-14 MED ORDER — SODIUM CHLORIDE 0.9 % IV SOLN: 500.0000 mL | Freq: Once | INTRAVENOUS | Status: DC

## 2019-02-14 NOTE — Progress Notes (Signed)
Pt's states no medical or surgical changes since previsit or office visit. 

## 2019-02-14 NOTE — Progress Notes (Signed)
Called to room to assist during endoscopic procedure.  Patient ID and intended procedure confirmed with present staff. Received instructions for my participation in the procedure from the performing physician.  

## 2019-02-14 NOTE — Progress Notes (Signed)
PT taken to PACU. Monitors in place. VSS. Report given to RN. 

## 2019-02-14 NOTE — Op Note (Signed)
[facility] Patient Name: Krista Henderson Procedure Date: 02/14/2019 9:05 AM MRN: [id number] Endoscopist: Jerene Bears , MD Age: 68 Referring MD:  Date of Birth: October 27, 1950 Gender: Female Account #: [id number] Procedure:                Colonoscopy Indications:              Surveillance: Personal history of adenomatous                            polyps on last colonoscopy 5 years ago, Family                            history of colon cancer in father Medicines:                Monitored Anesthesia Care Procedure:                Pre-Anesthesia Assessment:                           - Prior to the procedure, a History and Physical                            was performed, and patient medications and                            allergies were reviewed. The patient's tolerance of                            previous anesthesia was also reviewed. The risks                            and benefits of the procedure and the sedation                            options and risks were discussed with the patient.                            All questions were answered, and informed consent                            was obtained. Prior Anticoagulants: The patient has                            taken no previous anticoagulant or antiplatelet                            agents. ASA Grade Assessment: II - A patient with                            mild systemic disease. After reviewing the risks                            and benefits, the patient was deemed in  satisfactory condition to undergo the procedure.                           After obtaining informed consent, the colonoscope                            was passed under direct vision. Throughout the                            procedure, the patient's blood pressure, pulse, and                            oxygen saturations were monitored continuously. The                            Colonoscope was introduced through  the anus and                            advanced to the cecum, identified by appendiceal                            orifice and ileocecal valve. The colonoscopy was                            performed without difficulty. The patient tolerated                            the procedure well. The quality of the bowel                            preparation was good. The ileocecal valve,                            appendiceal orifice, and rectum were photographed. Scope In: 9:12:54 AM Scope Out: 9:29:16 AM Scope Withdrawal Time: 0 hours 13 minutes 29 seconds  Total Procedure Duration: 0 hours 16 minutes 22 seconds  Findings:                 The digital rectal exam was normal.                           Two sessile polyps were found in the cecum. The                            polyps were 3 to 5 mm in size. These polyps were                            removed with a cold snare. Resection and retrieval                            were complete.                           Four sessile polyps were found in the ascending  colon. The polyps were 3 to 6 mm in size. These                            polyps were removed with a cold snare. Resection                            and retrieval were complete.                           Internal hemorrhoids were found during                            retroflexion. The hemorrhoids were small. Complications:            No immediate complications. Estimated Blood Loss:     Estimated blood loss was minimal. Impression:               - Two 3 to 5 mm polyps in the cecum, removed with a                            cold snare. Resected and retrieved.                           - Four 3 to 6 mm polyps in the ascending colon,                            removed with a cold snare. Resected and retrieved.                           - Small internal hemorrhoids. Recommendation:           - Patient has a contact number available for                             emergencies. The signs and symptoms of potential                            delayed complications were discussed with the                            patient. Return to normal activities tomorrow.                            Written discharge instructions were provided to the                            patient.                           - Resume previous diet.                           - Continue present medications.                           - Await pathology  results.                           - Repeat colonoscopy is recommended for                            surveillance. The colonoscopy date will be                            determined after pathology results from today's                            exam become available for review. Jerene Bears, MD 02/14/2019 9:35:52 AM This report has been signed electronically.

## 2019-02-14 NOTE — Patient Instructions (Signed)
YOU HAD AN ENDOSCOPIC PROCEDURE TODAY AT [facility]:   Refer to the procedure report that was given to you for any specific questions about what was found during the examination.  If the procedure report does not answer your questions, please call your gastroenterologist to clarify.  If you requested that your care partner not be given the details of your procedure findings, then the procedure report has been included in a sealed envelope for you to review at your convenience later.  YOU SHOULD EXPECT: Some feelings of bloating in the abdomen. Passage of more gas than usual.  Walking can help get rid of the air that was put into your GI tract during the procedure and reduce the bloating. If you had a lower endoscopy (such as a colonoscopy or flexible sigmoidoscopy) you may notice spotting of blood in your stool or on the toilet paper. If you underwent a bowel prep for your procedure, you may not have a normal bowel movement for a few days.  Please Note:  You might notice some irritation and congestion in your nose or some drainage.  This is from the oxygen used during your procedure.  There is no need for concern and it should clear up in a day or so.  SYMPTOMS TO REPORT IMMEDIATELY:   Following lower endoscopy (colonoscopy or flexible sigmoidoscopy):  Excessive amounts of blood in the stool  Significant tenderness or worsening of abdominal pains  Swelling of the abdomen that is new, acute  Fever of 100F or higher  For urgent or emergent issues, a gastroenterologist can be reached at any hour by calling [phone].  DIET:  We do recommend a small meal at first, but then you may proceed to your regular diet.  Drink plenty of fluids but you should avoid alcoholic beverages for 24 hours.  ACTIVITY:  You should plan to take it easy for the rest of today and you should NOT DRIVE or use heavy machinery until tomorrow (because of the sedation medicines used during the test).     FOLLOW UP: Our staff will call the number listed on your records 48-72 hours following your procedure to check on you and address any questions or concerns that you may have regarding the information given to you following your procedure. If we do not reach you, we will leave a message.  We will attempt to reach you two times.  During this call, we will ask if you have developed any symptoms of COVID 19. If you develop any symptoms (ie: fever, flu-like symptoms, shortness of breath, cough etc.) before then, please call [phone].  If you test positive for Covid 19 in the 2 weeks post procedure, please call and report this information to [place].    If any biopsies were taken you will be contacted by phone or by letter within the next 1-3 weeks.  Please call us at [phone] if you have not heard about the biopsies in 3 weeks.   SIGNATURES/CONFIDENTIALITY: You and/or your care partner have signed paperwork which will be entered into your electronic medical record.  These signatures attest to the fact that that the information above on your After Visit Summary has been reviewed and is understood.  Full responsibility of the confidentiality of this discharge information lies with you and/or your care-partner.  Await pathology  Continue your normal medications  Please read over handouts about polyps and hemorrhoids

## 2019-02-18 ENCOUNTER — Telehealth: Payer: Self-pay

## 2019-02-18 NOTE — Telephone Encounter (Signed)
1. Have you developed a fever since your procedure? no  2.   Have you had an respiratory symptoms (SOB or cough) since your procedure? no  3.   Have you tested positive for COVID 19 since your procedure no  4.   Have you had any family members/close contacts diagnosed with the COVID 19 since your procedure?  no   If yes to any of these questions please route to Joylene John, RN and Alphonsa Gin, Therapist, sports.  Follow up Call-  Call back number 02/14/2019  Post procedure Call Back phone  # [phone]  Permission to leave phone message Yes  Some recent data might be hidden     Patient questions:  Do you have a fever, pain , or abdominal swelling? No. Pain Score  0 *  Have you tolerated food without any problems? Yes.    Have you been able to return to your normal activities? Yes.    Do you have any questions about your discharge instructions: Diet   No. Medications  No. Follow up visit  No.  Do you have questions or concerns about your Care? No.  Actions: * If pain score is 4 or above: No action needed, pain <4.

## 2019-02-19 ENCOUNTER — Encounter: Payer: Self-pay | Admitting: Internal Medicine

## 2019-02-26 ENCOUNTER — Telehealth: Payer: Self-pay | Admitting: Family Medicine

## 2019-02-26 DIAGNOSIS — R5383 Other fatigue: Secondary | ICD-10-CM

## 2019-02-26 DIAGNOSIS — E7800 Pure hypercholesterolemia, unspecified: Secondary | ICD-10-CM

## 2019-02-26 DIAGNOSIS — E559 Vitamin D deficiency, unspecified: Secondary | ICD-10-CM

## 2019-02-26 NOTE — Telephone Encounter (Signed)
Pt called and future lab orders placed

## 2019-02-26 NOTE — Telephone Encounter (Signed)
For review

## 2019-02-27 ENCOUNTER — Other Ambulatory Visit: Payer: Self-pay

## 2019-02-27 ENCOUNTER — Other Ambulatory Visit: Payer: Medicare HMO

## 2019-02-27 DIAGNOSIS — E559 Vitamin D deficiency, unspecified: Secondary | ICD-10-CM

## 2019-02-27 DIAGNOSIS — R5383 Other fatigue: Secondary | ICD-10-CM

## 2019-02-27 DIAGNOSIS — E7800 Pure hypercholesterolemia, unspecified: Secondary | ICD-10-CM

## 2019-02-27 LAB — LIPID PANEL

## 2019-02-28 LAB — HEPATIC FUNCTION PANEL
ALT: 12 IU/L (ref 0–32)
AST: 15 IU/L (ref 0–40)
Albumin: 4.5 g/dL (ref 3.8–4.8)
Alkaline Phosphatase: 75 IU/L (ref 39–117)
Bilirubin Total: 0.8 mg/dL (ref 0.0–1.2)
Bilirubin, Direct: 0.19 mg/dL (ref 0.00–0.40)
Total Protein: 6.3 g/dL (ref 6.0–8.5)

## 2019-02-28 LAB — CBC WITH DIFFERENTIAL/PLATELET
Basophils Absolute: 0 10*3/uL (ref 0.0–0.2)
Basos: 1 %
EOS (ABSOLUTE): 0.1 10*3/uL (ref 0.0–0.4)
Eos: 3 %
Hematocrit: 37.5 % (ref 34.0–46.6)
Hemoglobin: 13.1 g/dL (ref 11.1–15.9)
Immature Grans (Abs): 0 10*3/uL (ref 0.0–0.1)
Immature Granulocytes: 0 %
Lymphocytes Absolute: 1.1 10*3/uL (ref 0.7–3.1)
Lymphs: 36 %
MCH: 32.9 pg (ref 26.6–33.0)
MCHC: 34.9 g/dL (ref 31.5–35.7)
MCV: 94 fL (ref 79–97)
Monocytes Absolute: 0.2 10*3/uL (ref 0.1–0.9)
Monocytes: 8 %
Neutrophils Absolute: 1.6 10*3/uL (ref 1.4–7.0)
Neutrophils: 52 %
Platelets: 166 10*3/uL (ref 150–450)
RBC: 3.98 x10E6/uL (ref 3.77–5.28)
RDW: 11.9 % (ref 11.7–15.4)
WBC: 3.1 10*3/uL — ABNORMAL LOW (ref 3.4–10.8)

## 2019-02-28 LAB — THYROID PANEL WITH TSH
Free Thyroxine Index: 2.1 (ref 1.2–4.9)
T3 Uptake Ratio: 25 % (ref 24–39)
T4, Total: 8.2 ug/dL (ref 4.5–12.0)
TSH: 3.76 u[IU]/mL (ref 0.450–4.500)

## 2019-02-28 LAB — BMP8+EGFR
BUN/Creatinine Ratio: 17 (ref 12–28)
BUN: 15 mg/dL (ref 8–27)
CO2: 21 mmol/L (ref 20–29)
Calcium: 9.2 mg/dL (ref 8.7–10.3)
Chloride: 104 mmol/L (ref 96–106)
Creatinine, Ser: 0.88 mg/dL (ref 0.57–1.00)
GFR calc Af Amer: 79 mL/min/{1.73_m2} (ref >59)
GFR calc non Af Amer: 68 mL/min/{1.73_m2} (ref >59)
Glucose: 108 mg/dL — ABNORMAL HIGH (ref 65–99)
Potassium: 4.2 mmol/L (ref 3.5–5.2)
Sodium: 141 mmol/L (ref 134–144)

## 2019-02-28 LAB — LIPID PANEL
Chol/HDL Ratio: 3.4 ratio (ref 0.0–4.4)
Cholesterol, Total: 205 mg/dL — ABNORMAL HIGH (ref 100–199)
HDL: 60 mg/dL (ref >39)
LDL Calculated: 123 mg/dL — ABNORMAL HIGH (ref 0–99)
Triglycerides: 111 mg/dL (ref 0–149)
VLDL Cholesterol Cal: 22 mg/dL (ref 5–40)

## 2019-02-28 LAB — VITAMIN D 25 HYDROXY (VIT D DEFICIENCY, FRACTURES): Vit D, 25-Hydroxy: 28 ng/mL — ABNORMAL LOW (ref 30.0–100.0)

## 2019-03-03 ENCOUNTER — Telehealth: Payer: Self-pay | Admitting: Family Medicine

## 2019-03-03 NOTE — Telephone Encounter (Signed)
Pt called back and labs were reviewed 

## 2019-03-11 DIAGNOSIS — R69 Illness, unspecified: Secondary | ICD-10-CM | POA: Diagnosis not present

## 2019-03-12 DIAGNOSIS — Z1283 Encounter for screening for malignant neoplasm of skin: Secondary | ICD-10-CM | POA: Diagnosis not present

## 2019-03-12 DIAGNOSIS — H61001 Unspecified perichondritis of right external ear: Secondary | ICD-10-CM | POA: Diagnosis not present

## 2019-03-12 DIAGNOSIS — D225 Melanocytic nevi of trunk: Secondary | ICD-10-CM | POA: Diagnosis not present

## 2019-03-20 ENCOUNTER — Telehealth: Payer: Self-pay | Admitting: Family Medicine

## 2019-03-20 NOTE — Telephone Encounter (Signed)
Pt called and crestor discussed

## 2019-04-30 ENCOUNTER — Telehealth: Payer: Self-pay | Admitting: Family Medicine

## 2019-04-30 NOTE — Telephone Encounter (Signed)
Previous nurse of Dr. Laurance Flatten aware of patient's wishes.

## 2019-04-30 NOTE — Telephone Encounter (Signed)
Aware.  She will have to choose another provider before any lab work can be done.  She says she will think on which one to choose.

## 2019-05-13 ENCOUNTER — Ambulatory Visit (INDEPENDENT_AMBULATORY_CARE_PROVIDER_SITE_OTHER): Payer: Medicare HMO | Admitting: Obstetrics & Gynecology

## 2019-05-13 ENCOUNTER — Encounter: Payer: Self-pay | Admitting: Obstetrics & Gynecology

## 2019-05-13 ENCOUNTER — Other Ambulatory Visit: Payer: Self-pay

## 2019-05-13 VITALS — BP 122/80 | HR 72 | Temp 97.1°F | Ht 64.0 in | Wt 166.0 lb

## 2019-05-13 DIAGNOSIS — R300 Dysuria: Secondary | ICD-10-CM | POA: Diagnosis not present

## 2019-05-13 DIAGNOSIS — Z01419 Encounter for gynecological examination (general) (routine) without abnormal findings: Secondary | ICD-10-CM

## 2019-05-13 LAB — POCT URINALYSIS DIPSTICK
Bilirubin, UA: NEGATIVE
Blood, UA: NEGATIVE
Glucose, UA: NEGATIVE
Ketones, UA: NEGATIVE
Nitrite, UA: NEGATIVE
Protein, UA: NEGATIVE
Urobilinogen, UA: 0.2 E.U./dL
pH, UA: 5 (ref 5.0–8.0)

## 2019-05-13 NOTE — Patient Instructions (Signed)
Outpatient Pharmacy at [place],  [place]  [address]  Main: [phone]  Sunday:Closed Monday:7:30 AM - 6:00 PM Tuesday:7:30 AM - 6:00 PM Wednesday:7:30 AM - 6:00 PM Thursday:7:30 AM - 6:00 PM Friday:7:30 AM - 6:00 PM Saturday:Closed

## 2019-05-13 NOTE — Progress Notes (Signed)
68 y.o. G30P2002 Married White or Caucasian female here for annual exam.  She is having some mild dysuria.  She is not sure if this is a UTI or skin irritation.  Was at the beach over the weekend and was in her bathing suit too long one of the day.  Using Floragen probiotic.  She's had only one UTI this past year.    Denies vaginal bleeding.    Blood work was done end of June.  LDLs are mildly elevated.    Dr. Laurance Flatten retired July 1st.  She has been to the dermatologist this year.  Has a little cystic area in her left ear she's like to look at.  Has been removed   Daughter got married this year.  Had to change wedding date 4 times.  She had 18 people at her wedding.  She did move to [place] but was able to move back in December.      Patient's last menstrual period was 08/18/1989.          Sexually active: Yes.    The current method of family planning is status post hysterectomy.    Exercising: Yes.    walking Smoker:  no  Health Maintenance: Pap:  2002 Normal  History of abnormal Pap:  no MMG:  01/29/18 BIRADS1:neg .  Has decided to wait on having this done.   Colonoscopy: 02/14/19 polyps.  F/u 3 years.  Pathology showed tubular adenomas.  Dr. Hilarie Fredrickson following. BMD:   12/21/15, normal.  Repeat 5 year. TDaP:  2012 Pneumonia vaccine(s):  2019 Shingrix:   Zoster 2013 Hep C testing: 07/30/12 Neg  Screening Labs: PCP  UA: wbc=trace   reports that she has never smoked. She has never used smokeless tobacco. She reports that she does not drink alcohol or use drugs.  Past Medical History:  Diagnosis Date  . Blood transfusion without reported diagnosis   . Bone spur 09/2017   Left hip  . Endometriosis   . Hyperlipidemia   . 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   WBC 3,000 08/30/11 52 P 43 L  . Migraine without aura   . Vitamin D deficiency     Past Surgical History:  Procedure Laterality Date  . APPENDECTOMY  1965   7th grade in the '60's  . CESAREAN SECTION     x2 '84 & '85  . COLONOSCOPY   10/13/2013  . DIAGNOSTIC LAPAROSCOPY    . POLYPECTOMY    . TOTAL ABDOMINAL HYSTERECTOMY W/ BILATERAL SALPINGOOPHORECTOMY  08/1989   endometriosis    Current Outpatient Medications  Medication Sig Dispense Refill  . calcium citrate-vitamin D (CITRACAL+D) 315-200 MG-UNIT tablet Take 1 tablet by mouth 2 (two) times daily.    . Probiotic Product (PROBIOTIC DAILY PO) Take by mouth.    . Vitamin D, Ergocalciferol, (DRISDOL) 50000 units CAPS capsule Take 1 capsule (50,000 Units total) by mouth once a week. 12 capsule 3   No current facility-administered medications for this visit.     Family History  Problem Relation Age of Onset  . Diabetes Mother   . Osteoporosis Mother   . Hypertension Mother   . Colon cancer Father 19  . Colon polyps Father   . Stomach cancer Paternal Grandfather   . Breast cancer Paternal Aunt        78's  . Esophageal cancer Neg Hx   . Rectal cancer Neg Hx     Review of Systems  Genitourinary: Positive for dysuria.  All other systems reviewed and are  negative.   Exam:   BP 122/80   Pulse 72   Temp (!) 97.1 F (36.2 C) (Temporal)   Ht 5\' 4"  (1.626 m)   Wt 166 lb (75.3 kg)   LMP 08/18/1989   BMI 28.49 kg/m   Height: 5\' 4"  (162.6 cm)  Ht Readings from Last 3 Encounters:  05/13/19 5\' 4"  (1.626 m)  02/14/19 5' 4.5" (1.638 m)  11/11/18 5' 4.5" (1.638 m)    General appearance: alert, cooperative and appears stated age Head: Normocephalic, without obvious abnormality, atraumatic Neck: no adenopathy, supple, symmetrical, trachea midline and thyroid normal to inspection and palpation Lungs: clear to auscultation bilaterally Breasts: normal appearance, no masses or tenderness Heart: regular rate and rhythm Abdomen: soft, non-tender; bowel sounds normal; no masses,  no organomegaly Extremities: extremities normal, atraumatic, no cyanosis or edema Skin: Skin color, texture, turgor normal. No rashes or lesions Lymph nodes: Cervical, supraclavicular, and  axillary nodes normal. No abnormal inguinal nodes palpated Neurologic: Grossly normal   Pelvic: External genitalia:  no lesions              Urethra:  normal appearing urethra with no masses, tenderness or lesions              Bartholins and Skenes: normal                 Vagina: normal appearing vagina with normal color and discharge, no lesions              Cervix: absent              Pap taken: No. Bimanual Exam:  Uterus:  uterus absent              Adnexa: no mass, fullness, tenderness               Rectovaginal: Confirms               Anus:  normal sphincter tone, no lesions  Chaperone was present for exam.  A:  Well Woman with normal exam PMP, no HRT H/O TAH/BSO due to endometriosis 12/90 Recurrent UTI Vit D deficiency Family hx of colon cancer and person hx of colon polyps  P:   Mammogram guidelines reviewed.  She is going to delay having this done this year Colonoscopy is UTD.  Repeat 3 years. Pap smear not indicated Urine culture pending Reviewed Shingrinx vaccination Return annually or prn

## 2019-05-14 LAB — URINE CULTURE

## 2019-05-15 DIAGNOSIS — M79674 Pain in right toe(s): Secondary | ICD-10-CM | POA: Diagnosis not present

## 2019-05-15 DIAGNOSIS — B351 Tinea unguium: Secondary | ICD-10-CM | POA: Diagnosis not present

## 2019-05-15 DIAGNOSIS — M79676 Pain in unspecified toe(s): Secondary | ICD-10-CM | POA: Diagnosis not present

## 2019-05-15 DIAGNOSIS — M79675 Pain in left toe(s): Secondary | ICD-10-CM | POA: Diagnosis not present

## 2019-05-19 ENCOUNTER — Telehealth: Payer: Self-pay | Admitting: Obstetrics & Gynecology

## 2019-05-19 NOTE — Telephone Encounter (Signed)
Patient is calling regarding cholesterol. Patient stated that PCP suggested red yeast rice. Patient stated that she is not on any medication and would prefer to not start taking anything if it's not needed.

## 2019-05-19 NOTE — Telephone Encounter (Signed)
Call to patient. Patient states that her PCP, Dr. Laurance Flatten retired on 03-19-2019. States he sent in his recommendations to her that she should take Red Yeast Rice for her cholesterol. Patient states when she read about it online and on the bottle it said to take Q10 as well. Patient states, "I really don't want to take anything." Patient reaching out to see what Dr. Ammie Ferrier recommendations are? States, "my cholesterol & triglycerides are good. RN advised would review with Dr. Sabra Heck and return call. Patient agreeable.   Routing to provider for review.

## 2019-06-02 ENCOUNTER — Encounter: Payer: Self-pay | Admitting: Obstetrics & Gynecology

## 2019-06-02 NOTE — Telephone Encounter (Signed)
Called pt and answered questions.  Risk for cardiovascular disease is 6.2% (ACC/AHA risk calculator) over next 10 years so is reasonable to take red yeast rice.  Questions answered.  Ok to close encounter.

## 2019-06-23 ENCOUNTER — Telehealth: Payer: Self-pay | Admitting: General Practice

## 2019-06-23 NOTE — Telephone Encounter (Signed)
Pt called and aware she needs to get  Preservative free flu shot.

## 2019-06-24 ENCOUNTER — Other Ambulatory Visit: Payer: Self-pay

## 2019-06-24 ENCOUNTER — Ambulatory Visit (INDEPENDENT_AMBULATORY_CARE_PROVIDER_SITE_OTHER): Payer: Medicare HMO

## 2019-06-24 DIAGNOSIS — Z23 Encounter for immunization: Secondary | ICD-10-CM

## 2019-06-27 ENCOUNTER — Telehealth: Payer: Self-pay | Admitting: Obstetrics & Gynecology

## 2019-06-27 NOTE — Telephone Encounter (Addendum)
Patient has a tear in her rectum that has split and bled a little more. She want to know what she can put on it. Phone number is [phone].

## 2019-06-27 NOTE — Telephone Encounter (Signed)
Left message to call Talon Regala, RN at [facility] [phone].   

## 2019-06-30 ENCOUNTER — Ambulatory Visit: Payer: Medicare HMO | Admitting: Certified Nurse Midwife

## 2019-06-30 ENCOUNTER — Encounter: Payer: Self-pay | Admitting: Certified Nurse Midwife

## 2019-06-30 ENCOUNTER — Ambulatory Visit: Payer: Self-pay

## 2019-06-30 ENCOUNTER — Other Ambulatory Visit: Payer: Self-pay

## 2019-06-30 ENCOUNTER — Other Ambulatory Visit: Payer: Self-pay | Admitting: Obstetrics & Gynecology

## 2019-06-30 VITALS — BP 114/78 | HR 60 | Temp 97.0°F | Resp 16 | Wt 166.0 lb

## 2019-06-30 DIAGNOSIS — N949 Unspecified condition associated with female genital organs and menstrual cycle: Secondary | ICD-10-CM

## 2019-06-30 DIAGNOSIS — L900 Lichen sclerosus et atrophicus: Secondary | ICD-10-CM

## 2019-06-30 DIAGNOSIS — Z1231 Encounter for screening mammogram for malignant neoplasm of breast: Secondary | ICD-10-CM

## 2019-06-30 MED ORDER — CLOBETASOL PROPIONATE 0.05 % EX OINT: TOPICAL_OINTMENT | CUTANEOUS | 0 refills | Status: DC

## 2019-06-30 NOTE — Progress Notes (Signed)
Review of Systems  Constitutional: Negative.   HENT: Negative.   Eyes: Negative.   Respiratory: Negative.   Cardiovascular: Negative.   Gastrointestinal: Negative.        Strained with bowel movement and noted some blood on tissue 2 days ago. Has hemorrhoid   Genitourinary: Negative.   Skin: Positive for itching.       Rectal area, thought it was bite.   68 yo white married menopausal female here with complaint of some rectal itching and external vaginal itching 2-3 days ago. Also so vaginal dryness periodically. She also noted some blood on toilet tissue after bowel movement. History of hemorrhoid but wasn't concerned. Daughter looked at area and was concerned she had large"slit" above rectal opening. She is here to have evaluated. Reports no vaginal bleeding or black tarry or bloody stools. No other health issues today.  O: WDWN healthy female Affect normal, no distress  Skin:warm and dry Inguinal lymph nodes:non tender, no enlargement Vulva area: slight redness noted at fourchette of vaginal opening with decrease pigmentation noted with white appearance and striated cracking of skin, tender to touch, no bleeding. Lichen Sclerosis appearance. Shown to patient in mirror. Vagina: scant discharge affirm taken slight atrophic appearance.  Cervix,Uterus, Bilateral adnexa: surgically absent, no masses noted. Rectal area: slight increase pink with decrease pigmentation noted with white appearance and striated cracking of skin above and below rectal opening. No hemorrhoid noted. Scant blood noted on skin below opening from cracking. Lichen Sclerosis appearance.  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 Rx Clobetasol ointment twice daily for 2 weeks. Recheck in one week or if having concerns prior.Questions addressed. If has vaginal or  rectal bleeding needs to advise. Lab: [facility]  Rv prn

## 2019-06-30 NOTE — Telephone Encounter (Signed)
Left message for patient to return call to Triage nurse.

## 2019-06-30 NOTE — Telephone Encounter (Signed)
Call to patient. Patient states that her hemorrhoids have been "messed up." States she has been scratching and noticed a tear "where the poop comes out and it goes up." Denies fever. States bowel movements have been normal. Is complaining of bleeding from tear. Patient requesting to be seen. OV scheduled for 06-30-2019 at 1330 with Debbi. Covid prescreening negative.   Routing to provider and will close encounter.   Cc [name], CNM

## 2019-06-30 NOTE — Telephone Encounter (Signed)
Patient returned call. Please call cell number

## 2019-06-30 NOTE — Patient Instructions (Signed)
Lichen Sclerosus Lichen sclerosus is a skin problem. It can happen on any part of the body. It happens most often in the anal or genital areas. It can cause itching and discomfort. Treatment can help to control symptoms. It can also help prevent scarring that may lead to other problems. What are the causes? The cause of this condition is not known. It is not passed from one person to another (not contagious). What increases the risk? This condition is more likely to develop in women. It most often occurs after menopause. What are the signs or symptoms? Symptoms of this condition include:  Thin, wrinkled, white areas on the skin.  Thickened white areas on the skin.  Red and swollen patches (lesions) on the skin.  Tears or cracks in the skin.  Bruising.  Blood blisters.  Very bad itching.  Pain, itching, or burning when peeing (urinating).  Trouble pooping (constipation). How is this diagnosed? This condition may be diagnosed with a physical exam. A sample of your skin may also be removed to be looked at under a microscope (biopsy). How is this treated? This condition may be treated with:  Creams or ointments (topical steroids) that are put on the skin in the affected areas. This is the most common treatment.  Medicines that are taken by mouth.  Surgery. This is only needed if the condition is very bad and is causing problems such as scarring. Follow these instructions at home:  Take or use over-the-counter and prescription medicines only as told by your doctor.  Use creams or ointments as told by your doctor.  Do not scratch the affected areas of skin.  If you are a woman, keep the vagina as clean and dry as you can.  Clean the affected area of skin gently with water. Avoid using rough towels or toilet paper.  Keep all follow-up visits as told by your doctor. This is important. Contact a doctor if:  Your redness, swelling, or pain gets worse.  You have fluid,  blood, or pus coming from the area.  You have new red and swollen patches on your skin.  You have a fever.  You have pain during sex. Summary  Lichen sclerosus is a skin problem. It can cause itching and discomfort.  This condition is usually treated with creams or ointments that are put on the skin in the affected areas.  Use medicines only as told by your doctor.  Do not scratch the affected areas of skin.  Keep all follow-up visits as told by your doctor. This is important. This information is not intended to replace advice given to you by your health care provider. Make sure you discuss any questions you have with your health care provider. Document Released: 08/17/2008 Document Revised: 01/17/2018 Document Reviewed: 01/17/2018 Elsevier Patient Education  2020 Elsevier Inc.  

## 2019-07-01 ENCOUNTER — Other Ambulatory Visit: Payer: Self-pay | Admitting: Certified Nurse Midwife

## 2019-07-01 ENCOUNTER — Other Ambulatory Visit: Payer: Self-pay

## 2019-07-01 DIAGNOSIS — L900 Lichen sclerosus et atrophicus: Secondary | ICD-10-CM

## 2019-07-01 LAB — VAGINITIS/VAGINOSIS, DNA PROBE
Candida Species: NEGATIVE
Gardnerella vaginalis: NEGATIVE
Trichomonas vaginosis: NEGATIVE

## 2019-07-01 MED ORDER — CLOBETASOL PROPIONATE 0.05 % EX OINT: TOPICAL_OINTMENT | CUTANEOUS | 0 refills | Status: DC

## 2019-07-01 NOTE — Telephone Encounter (Signed)
Will change medication to cream to see if less expensive. She can check with pharmacy

## 2019-07-01 NOTE — Telephone Encounter (Signed)
Please send cream to [facility] per patient.

## 2019-07-01 NOTE — Telephone Encounter (Signed)
Patient aware that rx for the ointment was sent to [facility] but was sent for a smaller amt 30grams per deborah leonard, cnm. rx to [facility] was cancelled.

## 2019-07-01 NOTE — Telephone Encounter (Signed)
Routing to [employer], CNM

## 2019-07-01 NOTE — Telephone Encounter (Addendum)
Patient has decided she would like her prescription sent to [place], [place], [place].

## 2019-07-01 NOTE — Telephone Encounter (Signed)
Patient requests alternative for ointment called in yesterday. Please call patient on cell phone.

## 2019-07-02 MED ORDER — CLOBETASOL PROPIONATE 0.05 % EX CREA: TOPICAL_CREAM | CUTANEOUS | 1 refills | Status: DC

## 2019-07-02 NOTE — Telephone Encounter (Signed)
Rx pended for clobetasol 0.05 % cream. Dispense 15 grams/1RF.   Routing to Melvia Heaps, CNM to review.

## 2019-07-02 NOTE — Telephone Encounter (Signed)
Patient is calling regarding prescription that was call to [place], [place], [place]. She would like the cream and not the ointment called to the pharmacy. She asked for the smallest amount possible due to cost.

## 2019-07-03 NOTE — Telephone Encounter (Signed)
Call placed to patient, left detailed message, ok per dpr. Advised of RX to [facility] on file. Return call to office if any additional questions.   Encounter closed.

## 2019-07-07 ENCOUNTER — Ambulatory Visit: Payer: Medicare HMO | Admitting: Certified Nurse Midwife

## 2019-07-07 ENCOUNTER — Telehealth: Payer: Self-pay | Admitting: Certified Nurse Midwife

## 2019-07-07 NOTE — Telephone Encounter (Signed)
Spoke with patient. Seen in office on 06/30/19 for LS,   Clobetasol prescribed, f/u in 7 days. Patient did not start medication until today, requesting to schedule f/u with Dr. Sabra Heck. She will be out of town next week, request to schedule on 10/29.   OV scheduled for 10/29 at 4:30pm with Dr. Sabra Heck.   Routing to provider for final review. Patient is agreeable to disposition. Will close encounter.  Cc: Melvia Heaps, CNM

## 2019-07-07 NOTE — Telephone Encounter (Signed)
Patient cancelled appointment for Wednesday because she has not started medication. Would like to speak with nurse about it before starting.

## 2019-07-07 NOTE — Telephone Encounter (Addendum)
Left message to call Sharee Pimple, RN at [facility].    Patient seen in office on 10/12 for LS. Needs 1 wk f/u.

## 2019-07-09 ENCOUNTER — Ambulatory Visit: Payer: Medicare HMO | Admitting: Certified Nurse Midwife

## 2019-07-17 ENCOUNTER — Ambulatory Visit: Payer: Self-pay | Admitting: Obstetrics & Gynecology

## 2019-07-18 ENCOUNTER — Other Ambulatory Visit: Payer: Self-pay

## 2019-07-18 DIAGNOSIS — Z20822 Contact with and (suspected) exposure to covid-19: Secondary | ICD-10-CM

## 2019-07-20 LAB — NOVEL CORONAVIRUS, NAA: SARS-CoV-2, NAA: NOT DETECTED

## 2019-07-22 ENCOUNTER — Telehealth: Payer: Self-pay | Admitting: Family Medicine

## 2019-07-22 NOTE — Telephone Encounter (Signed)
appt made

## 2019-07-22 NOTE — Telephone Encounter (Signed)
Went and got tested fri - NEG  Shingles symptoms are starting

## 2019-07-23 ENCOUNTER — Encounter: Payer: Self-pay | Admitting: Family Medicine

## 2019-07-23 ENCOUNTER — Ambulatory Visit (INDEPENDENT_AMBULATORY_CARE_PROVIDER_SITE_OTHER): Payer: Medicare HMO | Admitting: Family Medicine

## 2019-07-23 DIAGNOSIS — R197 Diarrhea, unspecified: Secondary | ICD-10-CM | POA: Diagnosis not present

## 2019-07-23 DIAGNOSIS — R21 Rash and other nonspecific skin eruption: Secondary | ICD-10-CM

## 2019-07-23 MED ORDER — VALACYCLOVIR HCL 1 G PO TABS: 1000.0000 mg | ORAL_TABLET | Freq: Three times a day (TID) | ORAL | 0 refills | Status: AC

## 2019-07-23 NOTE — Progress Notes (Signed)
Virtual Visit via telephone Note Due to COVID-19 pandemic this visit was conducted virtually. This visit type was conducted due to national recommendations for restrictions regarding the COVID-19 Pandemic (e.g. social distancing, sheltering in place) in an effort to limit this patient's exposure and mitigate transmission in our community. All issues noted in this document were discussed and addressed.  A physical exam was not performed with this format.   I connected with Krista Henderson on 07/23/2019 at [phone] by telephone and verified that I am speaking with the correct person using two identifiers. Krista Henderson is currently located at home and no one is currently with them during visit. The provider, Monia Pouch, FNP is located in their office at time of visit.  I discussed the limitations, risks, security and privacy concerns of performing an evaluation and management service by telephone and the availability of in person appointments. I also discussed with the patient that there may be a patient responsible charge related to this service. The patient expressed understanding and agreed to proceed.  Subjective:  Patient ID: Krista Henderson, female    DOB: [birth year], 68 y.o.   MRN: [id number]  Chief Complaint:  Rash and Diarrhea   HPI: Krista Henderson is a 68 y.o. female presenting on 07/23/2019 for Rash and Diarrhea   Pt reports two days of diarrhea. States this has since resolved. States she now has general malaise and a red, raised rash to her right breast. States before the rash started she has pain and burning on the right side of her back. Pt states she now has a linear raised rash to her right breast that if painful, pruritic, and burning in nature. She has not been exposed to anything new.   Rash This is a new problem. The current episode started yesterday. The problem is unchanged. Location: right breast. The rash is characterized by redness, itchiness, burning and blistering. She  was exposed to nothing. Associated symptoms include diarrhea and fatigue. Pertinent negatives include no anorexia, congestion, cough, eye pain, facial edema, fever, joint pain, nail changes, rhinorrhea, shortness of breath, sore throat or vomiting. Past treatments include nothing.  Diarrhea  This is a new problem. The current episode started 1 to 4 weeks ago. The problem occurs less than 2 times per day. The problem has been resolved. The stool consistency is described as watery. Pertinent negatives include no abdominal pain, arthralgias, bloating, chills, coughing, fever, headaches, increased  flatus, myalgias, sweats, URI, vomiting or weight loss. Nothing aggravates the symptoms. She has tried nothing for the symptoms.     Relevant past medical, surgical, family, and social history reviewed and updated as indicated.  Allergies and medications reviewed and updated.   Past Medical History:  Diagnosis Date  . Blood transfusion without reported diagnosis   . Bone spur 09/2017   Left hip  . Endometriosis   . Hyperlipidemia   . 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   WBC 3,000 08/30/11 52 P 43 L  . Migraine without aura   . Vitamin D deficiency     Past Surgical History:  Procedure Laterality Date  . APPENDECTOMY  1965   7th grade in the '60's  . CESAREAN SECTION     x2 '84 & '85  . COLONOSCOPY  10/13/2013  . DIAGNOSTIC LAPAROSCOPY    . POLYPECTOMY    . TOTAL ABDOMINAL HYSTERECTOMY W/ BILATERAL SALPINGOOPHORECTOMY  08/1989   endometriosis    Social History   Socioeconomic History  . Marital status: Married  Spouse name: Not on file  . Number of children: Not on file  . Years of education: Not on file  . Highest education level: Not on file  Occupational History  . Not on file  Social Needs  . Financial resource strain: Not on file  . Food insecurity    Worry: Not on file    Inability: Not on file  . Transportation needs    Medical: Not on file    Non-medical: Not on file  Tobacco  Use  . Smoking status: Never Smoker  . Smokeless tobacco: Never Used  Substance and Sexual Activity  . Alcohol use: No  . Drug use: No  . Sexual activity: Yes    Partners: Male    Birth control/protection: Surgical    Comment: Hysterectomy  Lifestyle  . Physical activity    Days per week: Not on file    Minutes per session: Not on file  . Stress: Not on file  Relationships  . Social Herbalist on phone: Not on file    Gets together: Not on file    Attends religious service: Not on file    Active member of club or organization: Not on file    Attends meetings of clubs or organizations: Not on file    Relationship status: Not on file  . Intimate partner violence    Fear of current or ex partner: Not on file    Emotionally abused: Not on file    Physically abused: Not on file    Forced sexual activity: Not on file  Other Topics Concern  . Not on file  Social History Narrative  . Not on file    Outpatient Encounter Medications as of 07/23/2019  Medication Sig  . calcium citrate-vitamin D (CITRACAL+D) 315-200 MG-UNIT tablet Take 1 tablet by mouth 2 (two) times daily.  . clobetasol cream (TEMOVATE) 0.05 % Apply twice daily thinly to affected area for 5 days  . Probiotic Product (PROBIOTIC DAILY PO) Take by mouth.  . valACYclovir (VALTREX) 1000 MG tablet Take 1 tablet (1,000 mg total) by mouth 3 (three) times daily for 7 days.  . Vitamin D, Ergocalciferol, (DRISDOL) 50000 units CAPS capsule Take 1 capsule (50,000 Units total) by mouth once a week.   No facility-administered encounter medications on file as of 07/23/2019.     Allergies  Allergen Reactions  . Codeine     REACTION: rash/nausea/vomiting  . Influenza Vaccines     "injections site turned black color and had a hen size egg lump at site". Per pt she said not sure if the reactions was from a spider bite or flu shot.    . Sulfa Antibiotics Nausea And Vomiting    Review of Systems  Constitutional:  Positive for fatigue. Negative for appetite change, chills, diaphoresis, fever, unexpected weight change and weight loss.  HENT: Negative.  Negative for congestion, rhinorrhea and sore throat.   Eyes: Negative.  Negative for photophobia, pain and visual disturbance.  Respiratory: Negative for cough, chest tightness and shortness of breath.   Cardiovascular: Negative for chest pain, palpitations and leg swelling.  Gastrointestinal: Positive for diarrhea. Negative for abdominal pain, anorexia, bloating, blood in stool, constipation, flatus, nausea and vomiting.  Endocrine: Negative.   Genitourinary: Negative for decreased urine volume, difficulty urinating, dysuria, frequency and urgency.  Musculoskeletal: Negative for arthralgias, joint pain and myalgias.  Skin: Positive for rash. Negative for color change, nail changes and pallor.  Allergic/Immunologic: Negative.   Neurological:  Negative for dizziness, tremors, seizures, syncope, facial asymmetry, speech difficulty, weakness, light-headedness, numbness and headaches.  Hematological: Negative.   Psychiatric/Behavioral: Negative for confusion, hallucinations, sleep disturbance and suicidal ideas.  All other systems reviewed and are negative.        Observations/Objective: No vital signs or physical exam, this was a telephone or virtual health encounter.  Pt alert and oriented, answers all questions appropriately, and able to speak in full sentences.    Assessment and Plan: Selicia was seen today for rash and diarrhea.  Diagnoses and all orders for this visit:  Rash in adult Reported rash and associated prodromal symptoms consistent with shingles. Will initiate below. Report any new or worsening symptoms.  -     valACYclovir (VALTREX) 1000 MG tablet; Take 1 tablet (1,000 mg total) by mouth 3 (three) times daily for 7 days.  Diarrhea in adult patient Has resolve. Report any return of symptoms.     Follow Up Instructions: Return if  symptoms worsen or fail to improve.    I discussed the assessment and treatment plan with the patient. The patient was provided an opportunity to ask questions and all were answered. The patient agreed with the plan and demonstrated an understanding of the instructions.   The patient was advised to call back or seek an in-person evaluation if the symptoms worsen or if the condition fails to improve as anticipated.  The above assessment and management plan was discussed with the patient. The patient verbalized understanding of and has agreed to the management plan. Patient is aware to call the clinic if they develop any new symptoms or if symptoms persist or worsen. Patient is aware when to return to the clinic for a follow-up visit. Patient educated on when it is appropriate to go to the emergency department.    I provided 15 minutes of non-face-to-face time during this encounter. The call started at 0955. The call ended at 1010. The other time was used for coordination of care.    Monia Pouch, FNP-C [facility] [address] [place], [place] [address] [phone] 07/23/2019

## 2019-07-28 ENCOUNTER — Other Ambulatory Visit: Payer: Self-pay | Admitting: *Deleted

## 2019-07-28 ENCOUNTER — Telehealth: Payer: Self-pay | Admitting: Family Medicine

## 2019-07-28 DIAGNOSIS — R5383 Other fatigue: Secondary | ICD-10-CM

## 2019-07-28 DIAGNOSIS — R21 Rash and other nonspecific skin eruption: Secondary | ICD-10-CM

## 2019-07-28 DIAGNOSIS — E559 Vitamin D deficiency, unspecified: Secondary | ICD-10-CM

## 2019-07-28 DIAGNOSIS — E7800 Pure hypercholesterolemia, unspecified: Secondary | ICD-10-CM

## 2019-07-28 DIAGNOSIS — R197 Diarrhea, unspecified: Secondary | ICD-10-CM

## 2019-07-28 NOTE — Telephone Encounter (Signed)
Patient only wants to speak with Krista Henderson about her coming in and getting  labs only wants to speak with Krista Henderson B

## 2019-07-28 NOTE — Telephone Encounter (Signed)
lmtcb

## 2019-07-28 NOTE — Telephone Encounter (Signed)
If she continues to have diarrhea, we need to check her stool. We can check labs along with an alpha gal panel.

## 2019-07-28 NOTE — Telephone Encounter (Signed)
Pt aware of recommendations - lab orders placed

## 2019-07-28 NOTE — Telephone Encounter (Signed)
Foul smelling BM (like her moms did with C-Diff)   She didn't start shingles med  - she had diarrhea bad.  ( that part is normal as of yesterday)   She thinks she needs a blood work up for the rash and reg stuff.   Still has a little rash (better) Break out at night in sweats Weak ( from all the diarrhea) Lost 10 lbs   Neg COVID test 07/18/19.

## 2019-07-30 ENCOUNTER — Other Ambulatory Visit: Payer: Self-pay

## 2019-07-30 ENCOUNTER — Other Ambulatory Visit: Payer: Medicare HMO

## 2019-07-30 DIAGNOSIS — R21 Rash and other nonspecific skin eruption: Secondary | ICD-10-CM

## 2019-07-30 DIAGNOSIS — R197 Diarrhea, unspecified: Secondary | ICD-10-CM

## 2019-07-30 DIAGNOSIS — E7800 Pure hypercholesterolemia, unspecified: Secondary | ICD-10-CM

## 2019-07-30 DIAGNOSIS — R5383 Other fatigue: Secondary | ICD-10-CM

## 2019-07-30 DIAGNOSIS — E559 Vitamin D deficiency, unspecified: Secondary | ICD-10-CM

## 2019-07-31 ENCOUNTER — Ambulatory Visit: Payer: Self-pay | Admitting: Obstetrics & Gynecology

## 2019-08-05 LAB — ALPHA-GAL PANEL
Alpha Gal IgE*: <0.1 kU/L (ref <0.10)
Beef (Bos spp) IgE: <0.1 kU/L (ref <0.35)
Class Interpretation: 0
Class Interpretation: 0
Class Interpretation: 0
Lamb/Mutton (Ovis spp) IgE: <0.1 kU/L (ref <0.35)
Pork (Sus spp) IgE: <0.1 kU/L (ref <0.35)

## 2019-08-05 LAB — CBC WITH DIFFERENTIAL/PLATELET
Basophils Absolute: 0 10*3/uL (ref 0.0–0.2)
Basos: 0 %
EOS (ABSOLUTE): 0.1 10*3/uL (ref 0.0–0.4)
Eos: 3 %
Hematocrit: 37.6 % (ref 34.0–46.6)
Hemoglobin: 12.8 g/dL (ref 11.1–15.9)
Immature Grans (Abs): 0 10*3/uL (ref 0.0–0.1)
Immature Granulocytes: 0 %
Lymphocytes Absolute: 1.2 10*3/uL (ref 0.7–3.1)
Lymphs: 40 %
MCH: 32.2 pg (ref 26.6–33.0)
MCHC: 34 g/dL (ref 31.5–35.7)
MCV: 95 fL (ref 79–97)
Monocytes Absolute: 0.2 10*3/uL (ref 0.1–0.9)
Monocytes: 8 %
Neutrophils Absolute: 1.5 10*3/uL (ref 1.4–7.0)
Neutrophils: 49 %
Platelets: 191 10*3/uL (ref 150–450)
RBC: 3.98 x10E6/uL (ref 3.77–5.28)
RDW: 11.9 % (ref 11.7–15.4)
WBC: 3.1 10*3/uL — ABNORMAL LOW (ref 3.4–10.8)

## 2019-08-05 LAB — CMP14+EGFR
ALT: 16 IU/L (ref 0–32)
AST: 25 IU/L (ref 0–40)
Albumin/Globulin Ratio: 1.7 (ref 1.2–2.2)
Albumin: 4 g/dL (ref 3.8–4.8)
Alkaline Phosphatase: 80 IU/L (ref 39–117)
BUN/Creatinine Ratio: 14 (ref 12–28)
BUN: 11 mg/dL (ref 8–27)
Bilirubin Total: 0.8 mg/dL (ref 0.0–1.2)
CO2: 23 mmol/L (ref 20–29)
Calcium: 9.2 mg/dL (ref 8.7–10.3)
Chloride: 105 mmol/L (ref 96–106)
Creatinine, Ser: 0.78 mg/dL (ref 0.57–1.00)
GFR calc Af Amer: 90 mL/min/{1.73_m2} (ref >59)
GFR calc non Af Amer: 78 mL/min/{1.73_m2} (ref >59)
Globulin, Total: 2.4 g/dL (ref 1.5–4.5)
Glucose: 108 mg/dL — ABNORMAL HIGH (ref 65–99)
Potassium: 3.7 mmol/L (ref 3.5–5.2)
Sodium: 143 mmol/L (ref 134–144)
Total Protein: 6.4 g/dL (ref 6.0–8.5)

## 2019-08-05 LAB — LIPID PANEL
Chol/HDL Ratio: 4.6 ratio — ABNORMAL HIGH (ref 0.0–4.4)
Cholesterol, Total: 185 mg/dL (ref 100–199)
HDL: 40 mg/dL (ref >39)
LDL Chol Calc (NIH): 111 mg/dL — ABNORMAL HIGH (ref 0–99)
Triglycerides: 197 mg/dL — ABNORMAL HIGH (ref 0–149)
VLDL Cholesterol Cal: 34 mg/dL (ref 5–40)

## 2019-08-05 LAB — LYME AB/WESTERN BLOT REFLEX
LYME DISEASE AB, QUANT, IGM: <0.8 index (ref 0.00–0.79)
Lyme IgG/IgM Ab: <0.91 {ISR} (ref 0.00–0.90)

## 2019-08-05 LAB — ROCKY MTN SPOTTED FVR ABS PNL(IGG+IGM)
RMSF IgG: NEGATIVE
RMSF IgM: 0.25 index (ref 0.00–0.89)

## 2019-08-05 LAB — THYROID PANEL WITH TSH
Free Thyroxine Index: 2.6 (ref 1.2–4.9)
T3 Uptake Ratio: 27 % (ref 24–39)
T4, Total: 9.5 ug/dL (ref 4.5–12.0)
TSH: 3.06 u[IU]/mL (ref 0.450–4.500)

## 2019-08-05 LAB — VITAMIN D 25 HYDROXY (VIT D DEFICIENCY, FRACTURES): Vit D, 25-Hydroxy: 32.9 ng/mL (ref 30.0–100.0)

## 2019-08-11 DIAGNOSIS — L821 Other seborrheic keratosis: Secondary | ICD-10-CM | POA: Diagnosis not present

## 2019-08-11 DIAGNOSIS — L988 Other specified disorders of the skin and subcutaneous tissue: Secondary | ICD-10-CM | POA: Diagnosis not present

## 2019-08-11 DIAGNOSIS — D485 Neoplasm of uncertain behavior of skin: Secondary | ICD-10-CM | POA: Diagnosis not present

## 2019-08-11 DIAGNOSIS — H61001 Unspecified perichondritis of right external ear: Secondary | ICD-10-CM | POA: Diagnosis not present

## 2019-08-13 ENCOUNTER — Other Ambulatory Visit: Payer: Self-pay

## 2019-08-13 ENCOUNTER — Ambulatory Visit
Admission: RE | Admit: 2019-08-13 | Discharge: 2019-08-13 | Disposition: A | Discharge status: Home / Self Care | Payer: Medicare HMO | Source: Ambulatory Visit | Attending: Obstetrics & Gynecology | Admitting: Obstetrics & Gynecology

## 2019-08-13 DIAGNOSIS — Z1231 Encounter for screening mammogram for malignant neoplasm of breast: Secondary | ICD-10-CM

## 2019-08-20 ENCOUNTER — Other Ambulatory Visit: Payer: Self-pay

## 2019-08-22 ENCOUNTER — Other Ambulatory Visit: Payer: Self-pay

## 2019-08-22 ENCOUNTER — Encounter: Payer: Self-pay | Admitting: Obstetrics & Gynecology

## 2019-08-22 ENCOUNTER — Ambulatory Visit: Payer: Medicare HMO | Admitting: Obstetrics & Gynecology

## 2019-08-22 VITALS — BP 132/80 | HR 68 | Temp 97.7°F | Resp 12 | Wt 163.4 lb

## 2019-08-22 DIAGNOSIS — Z0184 Encounter for antibody response examination: Secondary | ICD-10-CM

## 2019-08-22 DIAGNOSIS — N9089 Other specified noninflammatory disorders of vulva and perineum: Secondary | ICD-10-CM | POA: Diagnosis not present

## 2019-08-22 DIAGNOSIS — R300 Dysuria: Secondary | ICD-10-CM | POA: Diagnosis not present

## 2019-08-22 LAB — POCT URINALYSIS DIPSTICK
Bilirubin, UA: NEGATIVE
Blood, UA: NEGATIVE
Glucose, UA: NEGATIVE
Ketones, UA: NEGATIVE
Leukocytes, UA: NEGATIVE
Nitrite, UA: NEGATIVE
Protein, UA: NEGATIVE
Spec Grav, UA: 1.015 (ref 1.010–1.025)
Urobilinogen, UA: 0.2 E.U./dL
pH, UA: 5 (ref 5.0–8.0)

## 2019-08-22 NOTE — Progress Notes (Signed)
GYNECOLOGY  VISIT  CC:   Patient states that she did not complete the course of the Clobetasol cream due to becoming ill. Patient feels as if she may still have the cracking of the skin at the rectum.  HPI: 68 y.o. G34P2002 Married White or Caucasian female here for possible lichen sclerosis diagnosed by visual inspection by Krista Henderson in early October.  Vaginitis testing was negative.  No biopsy was performed.  She was started on topical clobatesol that she did not use very long, maybe 10 days.  Reports she was at the beach with her cousin who was called about a work Social worker with [employer].  Pt's cousin did develop Covid.  Then pt started to experience significant episodes of diarrhea and low grade fever.  Was tested for Covid but this was negative.  She wonders if she was tested too early and she would like antibody testing.  Reports she never used the steroid ointment very regularly due to all the diarrhea issues.    Once the Covid test was negative, she did have an evaluation for diarrhea.  She did have blood work that was all normal.  This has all resolved and she feels fine.  Bowel movements are back to normal.    Reports she is having a little burning with urination.  Urine dip was negative today.  Saw dermatologist last week and had ear skin biopsy.  She would like me to look at this area today.  GYNECOLOGIC HISTORY: Patient's last menstrual period was 08/18/1989. Contraception: Hysterectomy Menopausal hormone therapy: none  Patient Active Problem List   Diagnosis Date Noted  . Shingles 05/11/2014  . Vitamin D deficiency 09/02/2013  . Hyperlipidemia 09/02/2013  . 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    Past Medical History:  Diagnosis Date  . Blood transfusion without reported diagnosis   . Bone spur 09/2017   Left hip  . Endometriosis   . Hyperlipidemia   . 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   WBC 3,000 08/30/11 52 P 43 L  . Migraine without aura   . Vitamin D deficiency     Past Surgical  History:  Procedure Laterality Date  . APPENDECTOMY  1965   7th grade in the '60's  . CESAREAN SECTION     x2 '84 & '85  . COLONOSCOPY  10/13/2013  . DIAGNOSTIC LAPAROSCOPY    . POLYPECTOMY    . TOTAL ABDOMINAL HYSTERECTOMY W/ BILATERAL SALPINGOOPHORECTOMY  08/1989   endometriosis    MEDS:   Current Outpatient Medications on File Prior to Visit  Medication Sig Dispense Refill  . calcium citrate-vitamin D (CITRACAL+D) 315-200 MG-UNIT tablet Take 1 tablet by mouth 2 (two) times daily.    . clobetasol cream (TEMOVATE) 0.05 % Apply twice daily thinly to affected area for 5 days 15 g 1  . Probiotic Product (PROBIOTIC DAILY PO) Take by mouth.    . Vitamin D, Ergocalciferol, (DRISDOL) 50000 units CAPS capsule Take 1 capsule (50,000 Units total) by mouth once a week. 12 capsule 3   No current facility-administered medications on file prior to visit.     ALLERGIES: Codeine, Influenza vaccines, and Sulfa antibiotics  Family History  Problem Relation Age of Onset  . Diabetes Mother   . Osteoporosis Mother   . Hypertension Mother   . Colon cancer Father 84  . Colon polyps Father   . Stomach cancer Paternal Grandfather   . Breast cancer Paternal Aunt        84's  . Esophageal cancer Neg Hx   .  Rectal cancer Neg Hx     SH:  Married, non smoker  Review of Systems  All other systems reviewed and are negative.   PHYSICAL EXAMINATION:   Vitals:   08/22/19 1133  BP: 132/80  Pulse: 68  Resp: 12  Temp: 97.7 F (36.5 C)   Physical Exam  Constitutional: She is oriented to person, place, and time. She appears well-developed and well-nourished.  HENT:  Right ear where biopsies performed are healing well.  GI: Hernia confirmed negative in the right inguinal area and confirmed negative in the left inguinal area.  Genitourinary:    Vagina normal.     There is no rash, tenderness, lesion or injury on the right labia. There is no rash, tenderness, lesion or injury on the left labia.   Lymphadenopathy:       Right: No inguinal adenopathy present.       Left: No inguinal adenopathy present.  Neurological: She is alert and oriented to person, place, and time.  Skin: Skin is warm and dry.  Psychiatric: She has a normal mood and affect.    Chaperone was present for exam.  Assessment: Vulvar irritation, area of hypopigmentation Dysuria Possible prior covid infection  Plan: I am not convinced this is lichen sclerosus as I only saw her a few months prior to the October appt where the LS&A was diagnosed by visual inspection.  Also, this is much improved from prior not and per pt reporting as well which would be uncommon for only a few days of topical steroid.  She is advised to start this again nightly and follow up in 1 month Urine culture pending SARS Covid antibody IgG obtained today

## 2019-08-23 LAB — SAR COV2 SEROLOGY (COVID19)AB(IGG),IA

## 2019-08-23 LAB — EUROIMMUN SARS-COV-2 AB, IGG: Euroimmun SARS-CoV-2 Ab, IgG: POSITIVE — AB

## 2019-08-24 LAB — URINE CULTURE

## 2019-08-29 ENCOUNTER — Telehealth: Payer: Self-pay | Admitting: *Deleted

## 2019-08-29 NOTE — Telephone Encounter (Signed)
Patient would like to speak with nurse regarding results on Mychart.

## 2019-08-29 NOTE — Telephone Encounter (Signed)
Spoke with patient. Patient states her Covid19 antibody testing results are "negative" and "positive". Requesting clarification.   Advised patient the "negative" that she is seeing is the reference range, advised patient of positive results per Dr. Ammie Ferrier 08/24/19 result note.   Questions answered, length of call 20 minutes.   Routing to provider for final review. Patient is agreeable to disposition. Will close encounter.

## 2019-08-29 NOTE — Telephone Encounter (Signed)
Left message to call Kalana Yust, RN at [facility] [phone].   

## 2019-08-29 NOTE — Telephone Encounter (Signed)
Patient is retuning a call to [place].

## 2019-08-29 NOTE — Telephone Encounter (Signed)
Patient is returning call to Jill. °

## 2019-09-03 DIAGNOSIS — H5213 Myopia, bilateral: Secondary | ICD-10-CM | POA: Diagnosis not present

## 2019-09-03 DIAGNOSIS — Z0100 Encounter for examination of eyes and vision without abnormal findings: Secondary | ICD-10-CM | POA: Diagnosis not present

## 2019-09-03 DIAGNOSIS — D3132 Benign neoplasm of left choroid: Secondary | ICD-10-CM | POA: Diagnosis not present

## 2019-09-03 DIAGNOSIS — H43813 Vitreous degeneration, bilateral: Secondary | ICD-10-CM | POA: Diagnosis not present

## 2019-09-03 DIAGNOSIS — H35411 Lattice degeneration of retina, right eye: Secondary | ICD-10-CM | POA: Diagnosis not present

## 2019-09-04 DIAGNOSIS — B351 Tinea unguium: Secondary | ICD-10-CM | POA: Diagnosis not present

## 2019-09-04 DIAGNOSIS — M79675 Pain in left toe(s): Secondary | ICD-10-CM | POA: Diagnosis not present

## 2019-09-04 DIAGNOSIS — M79674 Pain in right toe(s): Secondary | ICD-10-CM | POA: Diagnosis not present

## 2019-09-26 ENCOUNTER — Ambulatory Visit: Payer: Medicare HMO | Admitting: Obstetrics & Gynecology

## 2019-10-02 ENCOUNTER — Other Ambulatory Visit: Payer: Self-pay

## 2019-10-06 ENCOUNTER — Ambulatory Visit (INDEPENDENT_AMBULATORY_CARE_PROVIDER_SITE_OTHER): Payer: Medicare HMO | Admitting: Obstetrics & Gynecology

## 2019-10-06 ENCOUNTER — Encounter: Payer: Self-pay | Admitting: Obstetrics & Gynecology

## 2019-10-06 ENCOUNTER — Other Ambulatory Visit: Payer: Self-pay

## 2019-10-06 VITALS — BP 130/72 | HR 76 | Temp 97.8°F | Resp 12 | Wt 163.2 lb

## 2019-10-06 DIAGNOSIS — N9089 Other specified noninflammatory disorders of vulva and perineum: Secondary | ICD-10-CM

## 2019-10-06 NOTE — Progress Notes (Signed)
GYNECOLOGY  VISIT  CC:   Patient here for a one month recheck. Patient states that the tear is better  HPI: 69 y.o. G47P2002 Married White or Caucasian female here for 1 month recheck.  Reports she is feeling better and it feels like the skin is healing. Denies vulvar bleeding or vaginal bleeding.  Does feel more irritated if she has diarrhea and gets any on her skin.  She is not using the steroid but every other day.    She did have a biopsy on her ear.  This came back as a benign cyst.    GYNECOLOGIC HISTORY: Patient's last menstrual period was 08/18/1989. Contraception: Hysterectomy Menopausal hormone therapy: none  Patient Active Problem List   Diagnosis Date Noted  . Shingles 05/11/2014  . Vitamin D deficiency 09/02/2013  . Hyperlipidemia 09/02/2013  . 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    Past Medical History:  Diagnosis Date  . Blood transfusion without reported diagnosis   . Bone spur 09/2017   Left hip  . Endometriosis   . Hyperlipidemia   . 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   WBC 3,000 08/30/11 52 P 43 L  . Migraine without aura   . Vitamin D deficiency     Past Surgical History:  Procedure Laterality Date  . APPENDECTOMY  1965   7th grade in the '60's  . CESAREAN SECTION     x2 '84 & '85  . COLONOSCOPY  10/13/2013  . DIAGNOSTIC LAPAROSCOPY    . POLYPECTOMY    . TOTAL ABDOMINAL HYSTERECTOMY W/ BILATERAL SALPINGOOPHORECTOMY  08/1989   endometriosis    MEDS:   Current Outpatient Medications on File Prior to Visit  Medication Sig Dispense Refill  . calcium citrate-vitamin D (CITRACAL+D) 315-200 MG-UNIT tablet Take 1 tablet by mouth 2 (two) times daily.    . clobetasol cream (TEMOVATE) 0.05 % Apply twice daily thinly to affected area for 5 days 15 g 1  . Probiotic Product (PROBIOTIC DAILY PO) Take by mouth.    . Red Yeast Rice Extract (RED YEAST RICE PO) Take by mouth.    . Vitamin D, Ergocalciferol, (DRISDOL) 50000 units CAPS capsule Take 1 capsule (50,000 Units total) by mouth  once a week. 12 capsule 3  . ketoconazole (NIZORAL) 2 % cream APPLY TWICE DAILY TO TOES AND TOENAILS     No current facility-administered medications on file prior to visit.    ALLERGIES: Codeine, Influenza vaccines, and Sulfa antibiotics  Family History  Problem Relation Age of Onset  . Diabetes Mother   . Osteoporosis Mother   . Hypertension Mother   . Colon cancer Father 47  . Colon polyps Father   . Stomach cancer Paternal Grandfather   . Breast cancer Paternal Aunt        63's  . Esophageal cancer Neg Hx   . Rectal cancer Neg Hx     SH:  Married, non smoker  Review of Systems  All other systems reviewed and are negative.   PHYSICAL EXAMINATION:    BP 130/72 (BP Location: Left Arm, Patient Position: Sitting, Cuff Size: Normal)   Pulse 76   Temp 97.8 F (36.6 C) (Temporal)   Resp 12   Wt 163 lb 3.2 oz (74 kg)   LMP 08/18/1989   BMI 28.01 kg/m     General appearance: alert, cooperative and appears stated age Lymph:  no inguinal LAD noted  Pelvic: External genitalia:  Significant improvement in tissue in midline region of perineal body.  Hypopigmentation and thickened tissue is normal  in appearance.  Do not feel biopsy needed today.              Urethra:  normal appearing urethra with no masses, tenderness or lesions              Bartholins and Skenes: normal                 Anus:  normal sphincter tone, no lesions  Chaperone, Karmen Bongo, RN, was present for exam.  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  If has new symptoms, can use steroid bid x 5-7 days but if does not have complete resolution of symptoms, is advised to call.  Also advised to call with any new tears in skin esp those that don't heal.

## 2019-10-06 NOTE — Patient Instructions (Signed)
Use a topical barrier method like aquaphor on the skin morning and evening if possible.  If you have new symptoms, it is ok to restart the Clobetasol ointment twice daily for 5-7 days.  If symptoms don't resolve, please call for recheck.

## 2019-10-08 DIAGNOSIS — L570 Actinic keratosis: Secondary | ICD-10-CM | POA: Diagnosis not present

## 2019-10-08 DIAGNOSIS — H61001 Unspecified perichondritis of right external ear: Secondary | ICD-10-CM | POA: Diagnosis not present

## 2019-10-08 DIAGNOSIS — X32XXXD Exposure to sunlight, subsequent encounter: Secondary | ICD-10-CM | POA: Diagnosis not present

## 2019-10-14 DIAGNOSIS — R69 Illness, unspecified: Secondary | ICD-10-CM | POA: Diagnosis not present

## 2019-10-15 ENCOUNTER — Other Ambulatory Visit: Payer: Self-pay | Admitting: Family Medicine

## 2019-12-09 ENCOUNTER — Encounter: Payer: Self-pay | Admitting: Certified Nurse Midwife

## 2019-12-11 ENCOUNTER — Telehealth: Payer: Self-pay | Admitting: Family Medicine

## 2019-12-11 NOTE — Telephone Encounter (Signed)
Left detailed message.   

## 2019-12-11 NOTE — Telephone Encounter (Signed)
Pt called requesting to speak with Krista Henderson regarding issue she is having with ears. Says she has had inner ear problems before and just wants advice on what to take.

## 2019-12-11 NOTE — Telephone Encounter (Signed)
Dramamine

## 2019-12-11 NOTE — Telephone Encounter (Signed)
Patient would like to know is Krista Henderson thinks she should take benadryl or dramamine until her appointment with you for dizziness. Please advise and send back to pools.

## 2019-12-22 ENCOUNTER — Other Ambulatory Visit: Payer: Self-pay | Admitting: Family Medicine

## 2019-12-22 ENCOUNTER — Other Ambulatory Visit: Payer: Medicare HMO

## 2019-12-22 ENCOUNTER — Other Ambulatory Visit: Payer: Self-pay

## 2019-12-22 DIAGNOSIS — Z780 Asymptomatic menopausal state: Secondary | ICD-10-CM

## 2019-12-22 DIAGNOSIS — E559 Vitamin D deficiency, unspecified: Secondary | ICD-10-CM

## 2019-12-22 DIAGNOSIS — R5383 Other fatigue: Secondary | ICD-10-CM

## 2019-12-22 DIAGNOSIS — E7800 Pure hypercholesterolemia, unspecified: Secondary | ICD-10-CM

## 2019-12-23 LAB — LIPID PANEL
Chol/HDL Ratio: 3.8 ratio (ref 0.0–4.4)
Cholesterol, Total: 219 mg/dL — ABNORMAL HIGH (ref 100–199)
HDL: 57 mg/dL (ref >39)
LDL Chol Calc (NIH): 131 mg/dL — ABNORMAL HIGH (ref 0–99)
Triglycerides: 173 mg/dL — ABNORMAL HIGH (ref 0–149)
VLDL Cholesterol Cal: 31 mg/dL (ref 5–40)

## 2019-12-23 LAB — CMP14+EGFR
ALT: 11 IU/L (ref 0–32)
AST: 17 IU/L (ref 0–40)
Albumin/Globulin Ratio: 2.3 — ABNORMAL HIGH (ref 1.2–2.2)
Albumin: 4.6 g/dL (ref 3.8–4.8)
Alkaline Phosphatase: 89 IU/L (ref 39–117)
BUN/Creatinine Ratio: 16 (ref 12–28)
BUN: 13 mg/dL (ref 8–27)
Bilirubin Total: 1 mg/dL (ref 0.0–1.2)
CO2: 22 mmol/L (ref 20–29)
Calcium: 9.4 mg/dL (ref 8.7–10.3)
Chloride: 104 mmol/L (ref 96–106)
Creatinine, Ser: 0.82 mg/dL (ref 0.57–1.00)
GFR calc Af Amer: 85 mL/min/{1.73_m2} (ref >59)
GFR calc non Af Amer: 74 mL/min/{1.73_m2} (ref >59)
Globulin, Total: 2 g/dL (ref 1.5–4.5)
Glucose: 113 mg/dL — ABNORMAL HIGH (ref 65–99)
Potassium: 4 mmol/L (ref 3.5–5.2)
Sodium: 141 mmol/L (ref 134–144)
Total Protein: 6.6 g/dL (ref 6.0–8.5)

## 2019-12-23 LAB — THYROID PANEL WITH TSH
Free Thyroxine Index: 2.3 (ref 1.2–4.9)
T3 Uptake Ratio: 26 % (ref 24–39)
T4, Total: 8.8 ug/dL (ref 4.5–12.0)
TSH: 3.05 u[IU]/mL (ref 0.450–4.500)

## 2019-12-23 LAB — CBC WITH DIFFERENTIAL/PLATELET
Basophils Absolute: 0 10*3/uL (ref 0.0–0.2)
Basos: 1 %
EOS (ABSOLUTE): 0.1 10*3/uL (ref 0.0–0.4)
Eos: 3 %
Hematocrit: 36.2 % (ref 34.0–46.6)
Hemoglobin: 13.1 g/dL (ref 11.1–15.9)
Immature Grans (Abs): 0 10*3/uL (ref 0.0–0.1)
Immature Granulocytes: 0 %
Lymphocytes Absolute: 1.3 10*3/uL (ref 0.7–3.1)
Lymphs: 35 %
MCH: 33.5 pg — ABNORMAL HIGH (ref 26.6–33.0)
MCHC: 36.2 g/dL — ABNORMAL HIGH (ref 31.5–35.7)
MCV: 93 fL (ref 79–97)
Monocytes Absolute: 0.4 10*3/uL (ref 0.1–0.9)
Monocytes: 10 %
Neutrophils Absolute: 1.9 10*3/uL (ref 1.4–7.0)
Neutrophils: 51 %
Platelets: 188 10*3/uL (ref 150–450)
RBC: 3.91 x10E6/uL (ref 3.77–5.28)
RDW: 11.5 % — ABNORMAL LOW (ref 11.7–15.4)
WBC: 3.7 10*3/uL (ref 3.4–10.8)

## 2019-12-23 LAB — VITAMIN D 25 HYDROXY (VIT D DEFICIENCY, FRACTURES): Vit D, 25-Hydroxy: 29.4 ng/mL — ABNORMAL LOW (ref 30.0–100.0)

## 2019-12-24 ENCOUNTER — Encounter: Payer: Self-pay | Admitting: Family Medicine

## 2019-12-24 ENCOUNTER — Ambulatory Visit (INDEPENDENT_AMBULATORY_CARE_PROVIDER_SITE_OTHER): Payer: Medicare HMO | Admitting: Family Medicine

## 2019-12-24 ENCOUNTER — Other Ambulatory Visit: Payer: Self-pay

## 2019-12-24 VITALS — BP 121/78 | HR 78 | Temp 98.4°F | Ht 64.0 in | Wt 164.4 lb

## 2019-12-24 DIAGNOSIS — E559 Vitamin D deficiency, unspecified: Secondary | ICD-10-CM | POA: Diagnosis not present

## 2019-12-24 DIAGNOSIS — E7800 Pure hypercholesterolemia, unspecified: Secondary | ICD-10-CM | POA: Diagnosis not present

## 2019-12-24 MED ORDER — VITAMIN D (ERGOCALCIFEROL) 1.25 MG (50000 UNIT) PO CAPS: ORAL_CAPSULE | ORAL | 3 refills | Status: DC

## 2019-12-24 NOTE — Progress Notes (Addendum)
Subjective:  Patient ID: Krista Henderson, female    DOB: [birth year], 69 y.o.   MRN: [id number]  Patient Care Team: [name], DO as PCP - General (Family Medicine)   Chief Complaint:  Medical Management of Chronic Issues   HPI: Krista Henderson is a 69 y.o. female presenting on 12/24/2019 for Medical Management of Chronic Issues   1. Pure hypercholesterolemia Patient admits to occasional non-adherence to diet. She has not been taking her Red Yeast Rice in several months. States she stopped taking this due to being sick from COVID-19. States she has since recovered.   2. Vitamin D deficiency Pt is taking oral repletion therapy. Denies bone pain and tenderness, muscle weakness, fracture, and difficulty walking.   Relevant past medical, surgical, family, and social history reviewed and updated as indicated.  Allergies and medications reviewed and updated. Date reviewed: Chart in Epic.   Past Medical History:  Diagnosis Date  . Blood transfusion without reported diagnosis   . Bone spur 09/2017   Left hip  . Endometriosis   . Hx of removal of cyst    right ear  . Hyperlipidemia   . 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   WBC 3,000 08/30/11 52 P 43 L  . Migraine without aura   . Vitamin D deficiency     Past Surgical History:  Procedure Laterality Date  . APPENDECTOMY  1965   7th grade in the '60's  . CESAREAN SECTION     x2 '84 & '85  . COLONOSCOPY  10/13/2013  . DIAGNOSTIC LAPAROSCOPY    . POLYPECTOMY    . TOTAL ABDOMINAL HYSTERECTOMY W/ BILATERAL SALPINGOOPHORECTOMY  08/1989   endometriosis    Social History   Socioeconomic History  . Marital status: Married    Spouse name: Not on file  . Number of children: Not on file  . Years of education: Not on file  . Highest education level: Not on file  Occupational History  . Not on file  Tobacco Use  . Smoking status: Never Smoker  . Smokeless tobacco: Never Used  Substance and Sexual Activity  . Alcohol use: No  .  Drug use: No  . Sexual activity: Yes    Partners: Male    Birth control/protection: Surgical    Comment: Hysterectomy  Other Topics Concern  . Not on file  Social History Narrative  . Not on file   Social Determinants of Health   Financial Resource Strain:   . Difficulty of Paying Living Expenses:   Food Insecurity:   . Worried About Charity fundraiser in the Last Year:   . Arboriculturist in the Last Year:   Transportation Needs:   . Film/video editor (Medical):   Marland Kitchen Lack of Transportation (Non-Medical):   Physical Activity:   . Days of Exercise per Week:   . Minutes of Exercise per Session:   Stress:   . Feeling of Stress :   Social Connections:   . Frequency of Communication with Friends and Family:   . Frequency of Social Gatherings with Friends and Family:   . Attends Religious Services:   . Active Member of Clubs or Organizations:   . Attends Archivist Meetings:   Marland Kitchen Marital Status:   Intimate Partner Violence:   . Fear of Current or Ex-Partner:   . Emotionally Abused:   Marland Kitchen Physically Abused:   . Sexually Abused:     Outpatient Encounter Medications as of 12/24/2019  Medication Sig  . calcium citrate-vitamin D (CITRACAL+D) 315-200 MG-UNIT tablet Take 1 tablet by mouth 2 (two) times daily.  . clobetasol cream (TEMOVATE) 0.05 % Apply twice daily thinly to affected area for 5 days  . ketoconazole (NIZORAL) 2 % cream APPLY TWICE DAILY TO TOES AND TOENAILS  . Probiotic Product (PROBIOTIC DAILY PO) Take by mouth.  . Red Yeast Rice Extract (RED YEAST RICE PO) Take by mouth.  . Vitamin D, Ergocalciferol, (DRISDOL) 1.25 MG (50000 UNIT) CAPS capsule TAKE 1 CAPSULE BY MOUTH ONE TIME PER WEEK  . [DISCONTINUED] Vitamin D, Ergocalciferol, (DRISDOL) 1.25 MG (50000 UNIT) CAPS capsule TAKE 1 CAPSULE BY MOUTH ONE TIME PER WEEK   No facility-administered encounter medications on file as of 12/24/2019.    Allergies  Allergen Reactions  . Codeine     REACTION:  rash/nausea/vomiting  . Influenza Vaccines     "injections site turned black color and had a hen size egg lump at site". Per pt she said not sure if the reactions was from a spider bite or flu shot.    . Sulfa Antibiotics Nausea And Vomiting    Review of Systems  Constitutional: Negative for activity change, appetite change, chills, diaphoresis, fatigue, fever and unexpected weight change.  HENT: Negative.   Eyes: Negative.  Negative for photophobia and visual disturbance.  Respiratory: Negative for cough, chest tightness and shortness of breath.   Cardiovascular: Negative for chest pain, palpitations and leg swelling.  Gastrointestinal: Negative for abdominal pain, blood in stool, constipation, diarrhea, nausea and vomiting.  Endocrine: Negative.  Negative for cold intolerance, heat intolerance, polydipsia, polyphagia and polyuria.  Genitourinary: Negative for decreased urine volume, difficulty urinating, dysuria, frequency and urgency.  Musculoskeletal: Negative for arthralgias and myalgias.  Skin: Negative.   Allergic/Immunologic: Negative.   Neurological: Negative for dizziness, tremors, seizures, syncope, facial asymmetry, speech difficulty, weakness, light-headedness, numbness and headaches.  Hematological: Negative.   Psychiatric/Behavioral: Negative for confusion, hallucinations, sleep disturbance and suicidal ideas.  All other systems reviewed and are negative.       Objective:  BP 121/78   Pulse 78   Temp 98.4 F (36.9 C)   Ht '5\' 4"'  (1.626 m)   Wt 164 lb 6.4 oz (74.6 kg)   LMP 08/18/1989   SpO2 95%   BMI 28.22 kg/m    Wt Readings from Last 3 Encounters:  12/24/19 164 lb 6.4 oz (74.6 kg)  10/06/19 163 lb 3.2 oz (74 kg)  08/22/19 163 lb 6.4 oz (74.1 kg)    Physical Exam Vitals and nursing note reviewed.  Constitutional:      General: She is not in acute distress.    Appearance: Normal appearance. She is well-developed, well-groomed and overweight. She is not  ill-appearing, toxic-appearing or diaphoretic.  HENT:     Head: Normocephalic and atraumatic.     Jaw: There is normal jaw occlusion.     Right Ear: Hearing, tympanic membrane, ear canal and external ear normal.     Left Ear: Hearing normal.     Nose: Nose normal.     Mouth/Throat:     Lips: Pink.     Mouth: Mucous membranes are moist.     Pharynx: Oropharynx is clear. Uvula midline.  Eyes:     General: Lids are normal.     Extraocular Movements: Extraocular movements intact.     Conjunctiva/sclera: Conjunctivae normal.     Pupils: Pupils are equal, round, and reactive to light.  Neck:     Thyroid:  No thyroid mass, thyromegaly or thyroid tenderness.     Vascular: No carotid bruit or JVD.     Trachea: Trachea and phonation normal.  Cardiovascular:     Rate and Rhythm: Normal rate and regular rhythm.     Chest Wall: PMI is not displaced.     Pulses: Normal pulses.     Heart sounds: Normal heart sounds. No murmur. No friction rub. No gallop.   Pulmonary:     Effort: Pulmonary effort is normal. No respiratory distress.     Breath sounds: Normal breath sounds. No wheezing.  Abdominal:     General: Bowel sounds are normal. There is no distension or abdominal bruit.     Palpations: Abdomen is soft. There is no hepatomegaly or splenomegaly.     Tenderness: There is no abdominal tenderness. There is no right CVA tenderness or left CVA tenderness.     Hernia: No hernia is present.  Musculoskeletal:        General: Normal range of motion.     Cervical back: Normal range of motion and neck supple.     Right lower leg: No edema.     Left lower leg: No edema.  Lymphadenopathy:     Cervical: No cervical adenopathy.  Skin:    General: Skin is warm and dry.     Capillary Refill: Capillary refill takes less than 2 seconds.     Coloration: Skin is not cyanotic, jaundiced or pale.     Findings: No rash.  Neurological:     General: No focal deficit present.     Mental Status: She is alert  and oriented to person, place, and time.     Cranial Nerves: Cranial nerves are intact. No cranial nerve deficit.     Sensory: Sensation is intact. No sensory deficit.     Motor: Motor function is intact. No weakness.     Coordination: Coordination is intact. Coordination normal.     Gait: Gait is intact. Gait normal.     Deep Tendon Reflexes: Reflexes are normal and symmetric. Reflexes normal.  Psychiatric:        Attention and Perception: Attention and perception normal.        Mood and Affect: Mood and affect normal.        Speech: Speech normal.        Behavior: Behavior normal. Behavior is cooperative.        Thought Content: Thought content normal.        Cognition and Memory: Cognition and memory normal.        Judgment: Judgment normal.     Results for orders placed or performed in visit on 12/22/19  CMP14+EGFR  Result Value Ref Range   Glucose 113 (H) 65 - 99 mg/dL   BUN 13 8 - 27 mg/dL   Creatinine, Ser 0.82 0.57 - 1.00 mg/dL   GFR calc non Af Amer 74 >59 mL/min/1.73   GFR calc Af Amer 85 >59 mL/min/1.73   BUN/Creatinine Ratio 16 12 - 28   Sodium 141 134 - 144 mmol/L   Potassium 4.0 3.5 - 5.2 mmol/L   Chloride 104 96 - 106 mmol/L   CO2 22 20 - 29 mmol/L   Calcium 9.4 8.7 - 10.3 mg/dL   Total Protein 6.6 6.0 - 8.5 g/dL   Albumin 4.6 3.8 - 4.8 g/dL   Globulin, Total 2.0 1.5 - 4.5 g/dL   Albumin/Globulin Ratio 2.3 (H) 1.2 - 2.2   Bilirubin Total 1.0 0.0 -  1.2 mg/dL   Alkaline Phosphatase 89 39 - 117 IU/L   AST 17 0 - 40 IU/L   ALT 11 0 - 32 IU/L  CBC with Differential/Platelet  Result Value Ref Range   WBC 3.7 3.4 - 10.8 x10E3/uL   RBC 3.91 3.77 - 5.28 x10E6/uL   Hemoglobin 13.1 11.1 - 15.9 g/dL   Hematocrit 36.2 34.0 - 46.6 %   MCV 93 79 - 97 fL   MCH 33.5 (H) 26.6 - 33.0 pg   MCHC 36.2 (H) 31.5 - 35.7 g/dL   RDW 11.5 (L) 11.7 - 15.4 %   Platelets 188 150 - 450 x10E3/uL   Neutrophils 51 Not Estab. %   Lymphs 35 Not Estab. %   Monocytes 10 Not Estab. %    Eos 3 Not Estab. %   Basos 1 Not Estab. %   Neutrophils Absolute 1.9 1.4 - 7.0 x10E3/uL   Lymphocytes Absolute 1.3 0.7 - 3.1 x10E3/uL   Monocytes Absolute 0.4 0.1 - 0.9 x10E3/uL   EOS (ABSOLUTE) 0.1 0.0 - 0.4 x10E3/uL   Basophils Absolute 0.0 0.0 - 0.2 x10E3/uL   Immature Granulocytes 0 Not Estab. %   Immature Grans (Abs) 0.0 0.0 - 0.1 x10E3/uL  Lipid panel  Result Value Ref Range   Cholesterol, Total 219 (H) 100 - 199 mg/dL   Triglycerides 173 (H) 0 - 149 mg/dL   HDL 57 >39 mg/dL   VLDL Cholesterol Cal 31 5 - 40 mg/dL   LDL Chol Calc (NIH) 131 (H) 0 - 99 mg/dL   Chol/HDL Ratio 3.8 0.0 - 4.4 ratio  Thyroid Panel With TSH  Result Value Ref Range   TSH 3.050 0.450 - 4.500 uIU/mL   T4, Total 8.8 4.5 - 12.0 ug/dL   T3 Uptake Ratio 26 24 - 39 %   Free Thyroxine Index 2.3 1.2 - 4.9  VITAMIN D 25 Hydroxy (Vit-D Deficiency, Fractures)  Result Value Ref Range   Vit D, 25-Hydroxy 29.4 (L) 30.0 - 100.0 ng/mL       Pertinent labs & imaging results that were available during my care of the patient were reviewed by me and considered in my medical decision making.  Assessment & Plan:  Haide was seen today for medical management of chronic issues.  Diagnoses and all orders for this visit:  Pure hypercholesterolemia Total cholesterol and triglycerides slightly elevated. Continue red yeast rice. Add Omega 3 fatty acids twice daily with food.   Vitamin D deficiency Continue repletion therapy Eat foods rich in Vit D including milk, orange juice, yogurt with vitamin D added, salmon or mackerel, canned tuna fish, cereals with vitamin D added, and cod liver oil. Get out in the sun but make sure to wear at least SPF 30 sunscreen.  -     Vitamin D, Ergocalciferol, (DRISDOL) 1.25 MG (50000 UNIT) CAPS capsule; TAKE 1 CAPSULE BY MOUTH ONE TIME PER WEEK     Continue all other maintenance medications.  Follow up plan: Return in about 6 months (around 06/24/2020), or if symptoms worsen or fail to  improve.   Continue healthy lifestyle choices, including diet (rich in fruits, vegetables, and lean proteins, and low in salt and simple carbohydrates) and exercise (at least 30 minutes of moderate physical activity daily).  Educational handout given for heart healthy diet.  The above assessment and management plan was discussed with the patient. The patient verbalized understanding of and has agreed to the management plan. Patient is aware to call the clinic if  they develop any new symptoms or if symptoms persist or worsen. Patient is aware when to return to the clinic for a follow-up visit. Patient educated on when it is appropriate to go to the emergency department.   Robynn Pane, RN, FNP student  I personally was present during the history, physical exam, and medical decision-making activities of this service and have verified that the service and findings are accurately documented in the nurse practitioner student's note.  Monia Pouch, FNP-C [facility] [address] [place], [place] [address] [phone]

## 2019-12-24 NOTE — Patient Instructions (Signed)
Health Maintenance After Age 69 After age 69, you are at a higher risk for certain long-term diseases and infections as well as injuries from falls. Falls are a major cause of broken bones and head injuries in people who are older than age 69. Getting regular preventive care can help to keep you healthy and well. Preventive care includes getting regular testing and making lifestyle changes as recommended by your health care provider. Talk with your health care provider about:  Which screenings and tests you should have. A screening is a test that checks for a disease when you have no symptoms.  A diet and exercise plan that is right for you. What should I know about screenings and tests to prevent falls? Screening and testing are the best ways to find a health problem early. Early diagnosis and treatment give you the best chance of managing medical conditions that are common after age 69. Certain conditions and lifestyle choices may make you more likely to have a fall. Your health care provider may recommend:  Regular vision checks. Poor vision and conditions such as cataracts can make you more likely to have a fall. If you wear glasses, make sure to get your prescription updated if your vision changes.  Medicine review. Work with your health care provider to regularly review all of the medicines you are taking, including over-the-counter medicines. Ask your health care provider about any side effects that may make you more likely to have a fall. Tell your health care provider if any medicines that you take make you feel dizzy or sleepy.  Osteoporosis screening. Osteoporosis is a condition that causes the bones to get weaker. This can make the bones weak and cause them to break more easily.  Blood pressure screening. Blood pressure changes and medicines to control blood pressure can make you feel dizzy.  Strength and balance checks. Your health care provider may recommend certain tests to check your  strength and balance while standing, walking, or changing positions.  Foot health exam. Foot pain and numbness, as well as not wearing proper footwear, can make you more likely to have a fall.  Depression screening. You may be more likely to have a fall if you have a fear of falling, feel emotionally low, or feel unable to do activities that you used to do.  Alcohol use screening. Using too much alcohol can affect your balance and may make you more likely to have a fall. What actions can I take to lower my risk of falls? General instructions  Talk with your health care provider about your risks for falling. Tell your health care provider if: ? You fall. Be sure to tell your health care provider about all falls, even ones that seem minor. ? You feel dizzy, sleepy, or off-balance.  Take over-the-counter and prescription medicines only as told by your health care provider. These include any supplements.  Eat a healthy diet and maintain a healthy weight. A healthy diet includes low-fat dairy products, low-fat (lean) meats, and fiber from whole grains, beans, and lots of fruits and vegetables. Home safety  Remove any tripping hazards, such as rugs, cords, and clutter.  Install safety equipment such as grab bars in bathrooms and safety rails on stairs.  Keep rooms and walkways well-lit. Activity   Follow a regular exercise program to stay fit. This will help you maintain your balance. Ask your health care provider what types of exercise are appropriate for you.  If you need a cane or   walker, use it as recommended by your health care provider.  Wear supportive shoes that have nonskid soles. Lifestyle  Do not drink alcohol if your health care provider tells you not to drink.  If you drink alcohol, limit how much you have: ? 0-1 drink a day for women. ? 0-2 drinks a day for men.  Be aware of how much alcohol is in your drink. In the [place] one drink equals one typical bottle of beer (12  oz), one-half glass of wine (5 oz), or one shot of hard liquor (1 oz).  Do not use any products that contain nicotine or tobacco, such as cigarettes and e-cigarettes. If you need help quitting, ask your health care provider. Summary  Having a healthy lifestyle and getting preventive care can help to protect your health and wellness after age 69.  Screening and testing are the best way to find a health problem early and help you avoid having a fall. Early diagnosis and treatment give you the best chance for managing medical conditions that are more common for people who are older than age 69.  Falls are a major cause of broken bones and head injuries in people who are older than age 69. Take precautions to prevent a fall at home.  Work with your health care provider to learn what changes you can make to improve your health and wellness and to prevent falls. This information is not intended to replace advice given to you by your health care provider. Make sure you discuss any questions you have with your health care provider. Document Revised: 12/26/2018 Document Reviewed: 07/18/2017 Elsevier Patient Education  2020 Elsevier Inc.  

## 2019-12-24 NOTE — Addendum Note (Signed)
Addended by: Baruch Gouty on: 12/24/2019 01:31 PM   Modules accepted: Level of Service

## 2020-04-28 DIAGNOSIS — R69 Illness, unspecified: Secondary | ICD-10-CM | POA: Diagnosis not present

## 2020-06-16 ENCOUNTER — Other Ambulatory Visit: Payer: Medicare HMO

## 2020-06-16 DIAGNOSIS — Z20822 Contact with and (suspected) exposure to covid-19: Secondary | ICD-10-CM

## 2020-06-17 LAB — SARS-COV-2, NAA 2 DAY TAT

## 2020-06-17 LAB — NOVEL CORONAVIRUS, NAA: SARS-CoV-2, NAA: NOT DETECTED

## 2020-07-12 ENCOUNTER — Telehealth: Payer: Self-pay

## 2020-07-12 NOTE — Telephone Encounter (Signed)
Wanted to check on tdap status for new grandbaby

## 2020-07-13 ENCOUNTER — Other Ambulatory Visit: Payer: Self-pay | Admitting: Obstetrics & Gynecology

## 2020-07-13 DIAGNOSIS — Z1231 Encounter for screening mammogram for malignant neoplasm of breast: Secondary | ICD-10-CM

## 2020-07-14 ENCOUNTER — Ambulatory Visit: Payer: Medicare HMO

## 2020-07-15 NOTE — Progress Notes (Signed)
69 y.o. G45P2002 Married White or Caucasian female here for breast and pelvic exam.  I am also following her for recurrent groin yeast, vulvar itching.  Last BMD done 2019 but is time for this to be repeated.  [place] ordered by Dr. Laurance Flatten but he has retired.     Denies vaginal bleeding.  Daughter is pregnant and due December 23rd.  So happy about this.  Patient's last menstrual period was 08/18/1989.          Sexually active: Yes.    H/O STD:  no  Health Maintenance: PCP:  Ronnie Doss  Last wellness appt was 12/2019  Did blood work at that appt:  no Vaccines are up to date:  Yes, did have covid vaccination as well.  Colonoscopy:  02-14-2019 polyps f/u 52yrs MMG:  08-13-2019 category b density birads 1:neg BMD:  01-09-18 Last pap smear:  2002 normal H/o abnormal pap smear:  Yes, yrs ago poct urine-neg    reports that she has never smoked. She has never used smokeless tobacco. She reports that she does not drink alcohol and does not use drugs.  Past Medical History:  Diagnosis Date  . Abnormal Pap smear of cervix   . Blood transfusion without reported diagnosis   . Bone spur 09/2017   Left hip  . Endometriosis   . Hx of removal of cyst    right ear  . Hyperlipidemia   . 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   WBC 3,000 08/30/11 52 P 43 L  . Migraine without aura   . Vitamin D deficiency     Past Surgical History:  Procedure Laterality Date  . APPENDECTOMY  1965   7th grade in the '60's  . CESAREAN SECTION     x2 '84 & '85  . COLONOSCOPY  10/13/2013  . DIAGNOSTIC LAPAROSCOPY    . POLYPECTOMY    . TOTAL ABDOMINAL HYSTERECTOMY W/ BILATERAL SALPINGOOPHORECTOMY  08/1989   endometriosis    Current Outpatient Medications  Medication Sig Dispense Refill  . Calcium Citrate-Vitamin D (CALCIUM + D PO) Take by mouth.    . Probiotic Product (PROBIOTIC DAILY PO) Take by mouth.    . Vitamin D, Ergocalciferol, (DRISDOL) 1.25 MG (50000 UNIT) CAPS capsule TAKE 1 CAPSULE BY MOUTH ONE TIME PER  WEEK 12 capsule 3   No current facility-administered medications for this visit.    Family History  Problem Relation Age of Onset  . Diabetes Mother   . Osteoporosis Mother   . Hypertension Mother   . Colon cancer Father 66  . Colon polyps Father   . Stomach cancer Paternal Grandfather   . Breast cancer Paternal Aunt        37's  . Esophageal cancer Neg Hx   . Rectal cancer Neg Hx     Review of Systems  Constitutional: Negative.   HENT: Negative.   Eyes: Negative.   Respiratory: Negative.   Cardiovascular: Negative.   Gastrointestinal: Negative.   Endocrine: Negative.   Genitourinary:       Urinary frequency, burning & soreness in groin area on left side  Musculoskeletal: Negative.   Skin: Negative.   Allergic/Immunologic: Negative.   Neurological: Negative.   Hematological: Negative.   Psychiatric/Behavioral: Negative.     Exam:   BP 114/70   Pulse 68   Resp 16   Ht 5\' 4"  (1.626 m)   Wt 166 lb (75.3 kg)   LMP 08/18/1989   BMI 28.49 kg/m   Height: 5\' 4"  (162.6 cm)  General appearance: alert, cooperative and appears stated age Breasts: normal appearance, no masses or tenderness Abdomen: soft, non-tender; bowel sounds normal; no masses,  no organomegaly Lymph nodes: Cervical, supraclavicular, and axillary nodes normal.  No abnormal inguinal nodes palpated Neurologic: Grossly normal  Pelvic: External genitalia:  no lesions, skin appears completely normal today              Urethra:  normal appearing urethra with no masses, tenderness or lesions              Bartholins and Skenes: normal                 Vagina: normal appearing vagina with normal color and discharge, no lesions              Cervix: absent              Pap taken: No. Bimanual Exam:  Uterus:  uterus absent              Adnexa: no mass, fullness, tenderness               Rectovaginal: Confirms               Anus:  normal sphincter tone, no lesions  Chaperone, Olene Floss, CMA, was present  for exam.  A:  Breast and Pelvic exam PMP, no HRT H/O TAH/BSO due to endometriosis 12/90 H/o recurrent UTIs, stable H/o colon polyps Vulvar itching that was visually diagnosed as lichen sclerosus by CNM, [name], no biopsy  P:   Mammogram guidelines reviewed pap smear not obtained today Vulvar skin changes are all resolved.  Do not feel biopsy indicated Lab work/vaccines, mildly elevated LDLs managed by [facility] practice providers Return for breast and pelvic exam 1-2 years or sooner with new symtoms  23 minutes of total time was spent for this patient encounter, including preparation, face-to-face counseling with the patient and coordination of care, and documentation of the encounter.

## 2020-07-16 ENCOUNTER — Ambulatory Visit (INDEPENDENT_AMBULATORY_CARE_PROVIDER_SITE_OTHER): Payer: Medicare HMO | Admitting: Obstetrics & Gynecology

## 2020-07-16 ENCOUNTER — Other Ambulatory Visit: Payer: Self-pay

## 2020-07-16 ENCOUNTER — Encounter: Payer: Self-pay | Admitting: Obstetrics & Gynecology

## 2020-07-16 VITALS — BP 114/70 | HR 68 | Resp 16 | Ht 64.0 in | Wt 166.0 lb

## 2020-07-16 DIAGNOSIS — L292 Pruritus vulvae: Secondary | ICD-10-CM | POA: Diagnosis not present

## 2020-07-16 DIAGNOSIS — Z01419 Encounter for gynecological examination (general) (routine) without abnormal findings: Secondary | ICD-10-CM

## 2020-07-16 DIAGNOSIS — Z0000 Encounter for general adult medical examination without abnormal findings: Secondary | ICD-10-CM

## 2020-07-16 LAB — POCT URINALYSIS DIPSTICK
Bilirubin, UA: NEGATIVE
Blood, UA: NEGATIVE
Glucose, UA: NEGATIVE
Ketones, UA: NEGATIVE
Leukocytes, UA: NEGATIVE
Nitrite, UA: NEGATIVE
Protein, UA: NEGATIVE
Urobilinogen, UA: NEGATIVE E.U./dL — AB
pH, UA: 5 (ref 5.0–8.0)

## 2020-07-18 ENCOUNTER — Encounter: Payer: Self-pay | Admitting: Obstetrics & Gynecology

## 2020-07-20 DIAGNOSIS — L304 Erythema intertrigo: Secondary | ICD-10-CM | POA: Diagnosis not present

## 2020-07-20 DIAGNOSIS — Z1283 Encounter for screening for malignant neoplasm of skin: Secondary | ICD-10-CM | POA: Diagnosis not present

## 2020-07-20 DIAGNOSIS — L821 Other seborrheic keratosis: Secondary | ICD-10-CM | POA: Diagnosis not present

## 2020-07-21 DIAGNOSIS — M533 Sacrococcygeal disorders, not elsewhere classified: Secondary | ICD-10-CM | POA: Diagnosis not present

## 2020-08-03 ENCOUNTER — Other Ambulatory Visit: Payer: Self-pay

## 2020-08-03 ENCOUNTER — Ambulatory Visit (INDEPENDENT_AMBULATORY_CARE_PROVIDER_SITE_OTHER): Payer: Medicare HMO

## 2020-08-03 DIAGNOSIS — Z23 Encounter for immunization: Secondary | ICD-10-CM | POA: Diagnosis not present

## 2020-08-09 DIAGNOSIS — M533 Sacrococcygeal disorders, not elsewhere classified: Secondary | ICD-10-CM | POA: Diagnosis not present

## 2020-08-16 ENCOUNTER — Other Ambulatory Visit: Payer: Self-pay

## 2020-08-16 ENCOUNTER — Ambulatory Visit
Admission: RE | Admit: 2020-08-16 | Discharge: 2020-08-16 | Disposition: A | Discharge status: Home / Self Care | Payer: Medicare HMO | Source: Ambulatory Visit | Attending: Obstetrics & Gynecology | Admitting: Obstetrics & Gynecology

## 2020-08-16 DIAGNOSIS — Z1231 Encounter for screening mammogram for malignant neoplasm of breast: Secondary | ICD-10-CM | POA: Diagnosis not present

## 2020-08-17 ENCOUNTER — Ambulatory Visit: Payer: Medicare HMO

## 2020-08-17 DIAGNOSIS — Z23 Encounter for immunization: Secondary | ICD-10-CM | POA: Diagnosis not present

## 2020-08-18 ENCOUNTER — Ambulatory Visit: Payer: Medicare HMO

## 2020-08-25 ENCOUNTER — Ambulatory Visit: Payer: Medicare HMO

## 2020-09-06 ENCOUNTER — Other Ambulatory Visit: Payer: Self-pay

## 2020-09-06 ENCOUNTER — Other Ambulatory Visit: Payer: Medicare HMO

## 2020-09-06 ENCOUNTER — Other Ambulatory Visit: Payer: Self-pay | Admitting: *Deleted

## 2020-09-06 DIAGNOSIS — E7800 Pure hypercholesterolemia, unspecified: Secondary | ICD-10-CM

## 2020-09-06 DIAGNOSIS — E559 Vitamin D deficiency, unspecified: Secondary | ICD-10-CM

## 2020-09-06 DIAGNOSIS — D72819 Decreased white blood cell count, unspecified: Secondary | ICD-10-CM

## 2020-09-06 DIAGNOSIS — R739 Hyperglycemia, unspecified: Secondary | ICD-10-CM | POA: Diagnosis not present

## 2020-09-07 DIAGNOSIS — B351 Tinea unguium: Secondary | ICD-10-CM | POA: Diagnosis not present

## 2020-09-07 LAB — CBC WITH DIFFERENTIAL/PLATELET
Basophils Absolute: 0 10*3/uL (ref 0.0–0.2)
Basos: 1 %
EOS (ABSOLUTE): 0.1 10*3/uL (ref 0.0–0.4)
Eos: 2 %
Hematocrit: 37.4 % (ref 34.0–46.6)
Hemoglobin: 13.1 g/dL (ref 11.1–15.9)
Immature Grans (Abs): 0 10*3/uL (ref 0.0–0.1)
Immature Granulocytes: 0 %
Lymphocytes Absolute: 1.6 10*3/uL (ref 0.7–3.1)
Lymphs: 42 %
MCH: 32.9 pg (ref 26.6–33.0)
MCHC: 35 g/dL (ref 31.5–35.7)
MCV: 94 fL (ref 79–97)
Monocytes Absolute: 0.3 10*3/uL (ref 0.1–0.9)
Monocytes: 8 %
Neutrophils Absolute: 1.8 10*3/uL (ref 1.4–7.0)
Neutrophils: 47 %
Platelets: 216 10*3/uL (ref 150–450)
RBC: 3.98 x10E6/uL (ref 3.77–5.28)
RDW: 11.6 % — ABNORMAL LOW (ref 11.7–15.4)
WBC: 3.9 10*3/uL (ref 3.4–10.8)

## 2020-09-07 LAB — CMP14+EGFR
ALT: 14 IU/L (ref 0–32)
AST: 13 IU/L (ref 0–40)
Albumin/Globulin Ratio: 2.1 (ref 1.2–2.2)
Albumin: 4.5 g/dL (ref 3.8–4.8)
Alkaline Phosphatase: 93 IU/L (ref 44–121)
BUN/Creatinine Ratio: 13 (ref 12–28)
BUN: 12 mg/dL (ref 8–27)
Bilirubin Total: 0.9 mg/dL (ref 0.0–1.2)
CO2: 23 mmol/L (ref 20–29)
Calcium: 9.4 mg/dL (ref 8.7–10.3)
Chloride: 102 mmol/L (ref 96–106)
Creatinine, Ser: 0.89 mg/dL (ref 0.57–1.00)
GFR calc Af Amer: 76 mL/min/{1.73_m2} (ref >59)
GFR calc non Af Amer: 66 mL/min/{1.73_m2} (ref >59)
Globulin, Total: 2.1 g/dL (ref 1.5–4.5)
Glucose: 125 mg/dL — ABNORMAL HIGH (ref 65–99)
Potassium: 4.1 mmol/L (ref 3.5–5.2)
Sodium: 138 mmol/L (ref 134–144)
Total Protein: 6.6 g/dL (ref 6.0–8.5)

## 2020-09-07 LAB — LIPID PANEL
Chol/HDL Ratio: 4.3 ratio (ref 0.0–4.4)
Cholesterol, Total: 226 mg/dL — ABNORMAL HIGH (ref 100–199)
HDL: 52 mg/dL (ref >39)
LDL Chol Calc (NIH): 145 mg/dL — ABNORMAL HIGH (ref 0–99)
Triglycerides: 164 mg/dL — ABNORMAL HIGH (ref 0–149)
VLDL Cholesterol Cal: 29 mg/dL (ref 5–40)

## 2020-09-07 LAB — VITAMIN D 25 HYDROXY (VIT D DEFICIENCY, FRACTURES): Vit D, 25-Hydroxy: 30.4 ng/mL (ref 30.0–100.0)

## 2020-09-08 ENCOUNTER — Ambulatory Visit (INDEPENDENT_AMBULATORY_CARE_PROVIDER_SITE_OTHER): Payer: Medicare HMO | Admitting: Family Medicine

## 2020-09-08 ENCOUNTER — Other Ambulatory Visit: Payer: Self-pay

## 2020-09-08 ENCOUNTER — Encounter: Payer: Self-pay | Admitting: Family Medicine

## 2020-09-08 VITALS — BP 135/78 | HR 76 | Temp 97.7°F | Ht 64.0 in | Wt 169.8 lb

## 2020-09-08 DIAGNOSIS — R739 Hyperglycemia, unspecified: Secondary | ICD-10-CM

## 2020-09-08 DIAGNOSIS — H9201 Otalgia, right ear: Secondary | ICD-10-CM | POA: Diagnosis not present

## 2020-09-08 DIAGNOSIS — Z7689 Persons encountering health services in other specified circumstances: Secondary | ICD-10-CM | POA: Diagnosis not present

## 2020-09-08 DIAGNOSIS — E559 Vitamin D deficiency, unspecified: Secondary | ICD-10-CM | POA: Diagnosis not present

## 2020-09-08 DIAGNOSIS — E7800 Pure hypercholesterolemia, unspecified: Secondary | ICD-10-CM | POA: Diagnosis not present

## 2020-09-08 NOTE — Patient Instructions (Signed)
Plan for physical in 3 months, come in for fasting labs before hand.  Will try and add A1c to your already collected labs.  Mediterranean Diet A Mediterranean diet refers to food and lifestyle choices that are based on the traditions of countries located on the [employer]. This way of eating has been shown to help prevent certain conditions and improve outcomes for people who have chronic diseases, like kidney disease and heart disease. What are tips for following this plan? Lifestyle  Cook and eat meals together with your family, when possible.  Drink enough fluid to keep your urine clear or pale yellow.  Be physically active every day. This includes: ? Aerobic exercise like running or swimming. ? Leisure activities like gardening, walking, or housework.  Get 7-8 hours of sleep each night.  If recommended by your health care provider, drink red wine in moderation. This means 1 glass a day for nonpregnant women and 2 glasses a day for men. A glass of wine equals 5 oz (150 mL). Reading food labels   Check the serving size of packaged foods. For foods such as rice and pasta, the serving size refers to the amount of cooked product, not dry.  Check the total fat in packaged foods. Avoid foods that have saturated fat or trans fats.  Check the ingredients list for added sugars, such as corn syrup. Shopping  At the grocery store, buy most of your food from the areas near the walls of the store. This includes: ? Fresh fruits and vegetables (produce). ? Grains, beans, nuts, and seeds. Some of these may be available in unpackaged forms or large amounts (in bulk). ? Fresh seafood. ? Poultry and eggs. ? Low-fat dairy products.  Buy whole ingredients instead of prepackaged foods.  Buy fresh fruits and vegetables in-season from local farmers markets.  Buy frozen fruits and vegetables in resealable bags.  If you do not have access to quality fresh seafood, buy precooked frozen shrimp  or canned fish, such as tuna, salmon, or sardines.  Buy small amounts of raw or cooked vegetables, salads, or olives from the deli or salad bar at your store.  Stock your pantry so you always have certain foods on hand, such as olive oil, canned tuna, canned tomatoes, rice, pasta, and beans. Cooking  Cook foods with extra-virgin olive oil instead of using butter or other vegetable oils.  Have meat as a side dish, and have vegetables or grains as your main dish. This means having meat in small portions or adding small amounts of meat to foods like pasta or stew.  Use beans or vegetables instead of meat in common dishes like chili or lasagna.  Experiment with different cooking methods. Try roasting or broiling vegetables instead of steaming or sauteing them.  Add frozen vegetables to soups, stews, pasta, or rice.  Add nuts or seeds for added healthy fat at each meal. You can add these to yogurt, salads, or vegetable dishes.  Marinate fish or vegetables using olive oil, lemon juice, garlic, and fresh herbs. Meal planning   Plan to eat 1 vegetarian meal one day each week. Try to work up to 2 vegetarian meals, if possible.  Eat seafood 2 or more times a week.  Have healthy snacks readily available, such as: ? Vegetable sticks with hummus. ? [place] yogurt. ? Fruit and nut trail mix.  Eat balanced meals throughout the week. This includes: ? Fruit: 2-3 servings a day ? Vegetables: 4-5 servings a day ? Low-fat  dairy: 2 servings a day ? Fish, poultry, or lean meat: 1 serving a day ? Beans and legumes: 2 or more servings a week ? Nuts and seeds: 1-2 servings a day ? Whole grains: 6-8 servings a day ? Extra-virgin olive oil: 3-4 servings a day  Limit red meat and sweets to only a few servings a month What are my food choices?  Mediterranean diet ? Recommended  Grains: Whole-grain pasta. Brown rice. Bulgar wheat. Polenta. Couscous. Whole-wheat bread. Orpah Cobb.  Vegetables:  Artichokes. Beets. Broccoli. Cabbage. Carrots. Eggplant. Green beans. Chard. Kale. Spinach. Onions. Leeks. Peas. Squash. Tomatoes. Peppers. Radishes.  Fruits: Apples. Apricots. Avocado. Berries. Bananas. Cherries. Dates. Figs. Grapes. Lemons. Melon. Oranges. Peaches. Plums. Pomegranate.  Meats and other protein foods: Beans. Almonds. Sunflower seeds. Pine nuts. Peanuts. Cod. Salmon. Scallops. Shrimp. Tuna. Tilapia. Clams. Oysters. Eggs.  Dairy: Low-fat milk. Cheese. Greek yogurt.  Beverages: Water. Red wine. Herbal tea.  Fats and oils: Extra virgin olive oil. Avocado oil. Grape seed oil.  Sweets and desserts: [place] yogurt with honey. Baked apples. Poached pears. Trail mix.  Seasoning and other foods: Basil. Cilantro. Coriander. Cumin. Mint. Parsley. Sage. Rosemary. Tarragon. Garlic. Oregano. Thyme. Pepper. Balsalmic vinegar. Tahini. Hummus. Tomato sauce. Olives. Mushrooms. ? Limit these  Grains: Prepackaged pasta or rice dishes. Prepackaged cereal with added sugar.  Vegetables: Deep fried potatoes (french fries).  Fruits: Fruit canned in syrup.  Meats and other protein foods: Beef. Pork. Lamb. Poultry with skin. Hot dogs. Tomasa Blase.  Dairy: Ice cream. Sour cream. Whole milk.  Beverages: Juice. Sugar-sweetened soft drinks. Beer. Liquor and spirits.  Fats and oils: Butter. Canola oil. Vegetable oil. Beef fat (tallow). Lard.  Sweets and desserts: Cookies. Cakes. Pies. Candy.  Seasoning and other foods: Mayonnaise. Premade sauces and marinades. The items listed may not be a complete list. Talk with your dietitian about what dietary choices are right for you. Summary  The Mediterranean diet includes both food and lifestyle choices.  Eat a variety of fresh fruits and vegetables, beans, nuts, seeds, and whole grains.  Limit the amount of red meat and sweets that you eat.  Talk with your health care provider about whether it is safe for you to drink red wine in moderation. This means 1  glass a day for nonpregnant women and 2 glasses a day for men. A glass of wine equals 5 oz (150 mL). This information is not intended to replace advice given to you by your health care provider. Make sure you discuss any questions you have with your health care provider. Document Revised: 05/04/2016 Document Reviewed: 04/27/2016 Elsevier Patient Education  2020 [employer].

## 2020-09-08 NOTE — Progress Notes (Signed)
Subjective: CC: est care, review labs PCP: [name], DO XTG:GYIRSW Krista Henderson is a 69 y.o. female presenting to clinic today for:  1.  Hyperlipidemia Patient had labs performed on the twentieth which did show an ASCVD risk for 7.2%.  Her LDL had increased from April of this year to 145.  She had persistently elevated triglycerides.  She is not currently treated with a statin.  She denies any family history of cardiovascular disease.  She admits that she has not been exercising or eating like she normally does would like to work on this.  2.  Vitamin D deficiency Patient had vitamin D level rechecked and it had come up slightly into the low normal range.  She was technically insufficient in April at 29.4.  She is taking vitamin D weekly as well as a multivitamin with vitamin D.  3.  Palmar lesion Patient reports that she is having nonpainful lesion on the left palm.  She showed this to one of her relatives they thought that it was likely cystic in nature.  She denies any sensory changes, change in function.  4.  Ear tenderness Patient reports that her right ear sometimes become sore when she lays on it.  She admits that she does have a new pair of glasses but has not thought of that possibly causing it.  She had a history of a cyst that was removed from the external auditory canal but the tenderness more is along the helix.   ROS: Per HPI  Allergies  Allergen Reactions   Codeine     REACTION: rash/nausea/vomiting   Influenza Vaccines     "injections site turned black color and had a hen size egg lump at site". Per pt she said not sure if the reactions was from a spider bite or flu shot.     Sulfa Antibiotics Nausea And Vomiting   Past Medical History:  Diagnosis Date   Abnormal Pap smear of cervix    Blood transfusion without reported diagnosis    Bone spur 09/2017   Left hip   Endometriosis    Hx of removal of cyst    right ear   Hyperlipidemia     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   WBC 3,000 08/30/11 52 P 43 L   Migraine without aura    Vitamin D deficiency     Current Outpatient Medications:    Calcium Citrate-Vitamin D (CALCIUM + D PO), Take by mouth., Disp: , Rfl:    Probiotic Product (PROBIOTIC DAILY PO), Take by mouth., Disp: , Rfl:    Vitamin D, Ergocalciferol, (DRISDOL) 1.25 MG (50000 UNIT) CAPS capsule, TAKE 1 CAPSULE BY MOUTH ONE TIME PER WEEK, Disp: 12 capsule, Rfl: 3 Social History   Socioeconomic History   Marital status: Married    Spouse name: Doctor, general practice   Number of children: Not on file   Years of education: Not on file   Highest education level: Not on file  Occupational History   Not on file  Tobacco Use   Smoking status: Never Smoker   Smokeless tobacco: Never Used  Vaping Use   Vaping Use: Never used  Substance and Sexual Activity   Alcohol use: No   Drug use: No   Sexual activity: Yes    Partners: Male    Birth control/protection: Surgical    Comment: Hysterectomy  Other Topics Concern   Not on file  Social History Narrative   Married to [place].  Her daughter is expecting a child in  the next couple of days.  They will have a total of 2 grandchildren, both girls.   Social Determinants of Health   Financial Resource Strain: Not on file  Food Insecurity: Not on file  Transportation Needs: Not on file  Physical Activity: Not on file  Stress: Not on file  Social Connections: Not on file  Intimate Partner Violence: Not on file   Family History  Problem Relation Age of Onset   Diabetes Mother    Osteoporosis Mother    Hypertension Mother    Colon cancer Father 2   Colon polyps Father    Stomach cancer Paternal Grandfather    Breast cancer Paternal Aunt        44's   Cancer Paternal Aunt        breast   Graves' disease Daughter    Esophageal cancer Neg Hx    Rectal cancer Neg Hx     Objective: Office vital signs reviewed. BP 135/78    Pulse 76    Temp 97.7 F (36.5 C)     Ht 5\' 4"  (1.626 m)    Wt 169 lb 12.8 oz (77 kg)    LMP 08/18/1989    SpO2 96%    BMI 29.15 kg/m   Physical Examination:  General: Awake, alert, well nourished, No acute distress HEENT: Normal; sclera white.  Moist mucous membranes Cardio: regular rate and rhythm, S1S2 heard, no murmurs appreciated Pulm: clear to auscultation bilaterally, no wheezes, rhonchi or rales; normal work of breathing on room air Extremities: warm, well perfused, No edema, cyanosis or clubbing; +2 pulses bilaterally MSK: normal gait and station  Left hand: An area of soft tissue swelling noted between digits 4 and 5 on the palmar aspect consistent with fluid collection.  No overt ganglion cyst palpated.  She has full active range of motion of the fingers. Skin: She has a healing postop site noted along the right external auditory canal.  There is no point tenderness to palpation to the tragus, helix. Neuro: Hand sensation intact  Assessment/ Plan: 69 y.o. female   Pure hypercholesterolemia - Plan: Lipid panel  Vitamin D deficiency  Establishing care with new doctor, encounter for  Elevated serum glucose - Plan: Bayer DCA Hb A1c Waived  Ear pain, referred, right  We reviewed her labs.  I am adding an A1c given her persistently elevated fasting blood sugars.  We will have her recheck an A1c, lipid panel in 3 months prior to her full physical exam  The pulmonary lesion she described looks to be simply a fluid collection.  Unsure if this is related to previously ruptured ganglion cyst but it does not seem to be causing her any issues today and there were no concerning features  With regards to her ear pain I think this is likely referred from her new glasses and the fact that she is having to use a mask.  It is likely tugging on the cartilage here giving her referred pain to the tragus.  Continue vitamin D supplementation.  A handout was provided.  May need to consider DEXA scan if she has not had one done in the  last 2 years.  With regards to her hyperlipidemia we talked about Mediterranean diet.  I gave her handout about this.  I would like to check a fasting lipid panel as above.  Reinforced adequate exercise  Orders Placed This Encounter  Procedures   Lipid panel    Standing Status:   Future  Standing Expiration Date:   09/08/2021   Bayer DCA Hb A1c Waived    Standing Status:   Future    Standing Expiration Date:   09/08/2021   No orders of the defined types were placed in this encounter.    Raliegh Ip, DO [facility] [phone]

## 2020-09-10 LAB — HGB A1C W/O EAG: Hgb A1c MFr Bld: 5.7 % — ABNORMAL HIGH (ref 4.8–5.6)

## 2020-09-10 LAB — SPECIMEN STATUS REPORT

## 2020-09-20 DIAGNOSIS — M545 Low back pain, unspecified: Secondary | ICD-10-CM | POA: Diagnosis not present

## 2020-09-20 DIAGNOSIS — M25551 Pain in right hip: Secondary | ICD-10-CM | POA: Diagnosis not present

## 2020-09-29 DIAGNOSIS — M1611 Unilateral primary osteoarthritis, right hip: Secondary | ICD-10-CM | POA: Diagnosis not present

## 2020-11-04 DIAGNOSIS — H35411 Lattice degeneration of retina, right eye: Secondary | ICD-10-CM | POA: Diagnosis not present

## 2020-11-04 DIAGNOSIS — H524 Presbyopia: Secondary | ICD-10-CM | POA: Diagnosis not present

## 2020-11-04 DIAGNOSIS — H25813 Combined forms of age-related cataract, bilateral: Secondary | ICD-10-CM | POA: Diagnosis not present

## 2020-11-04 DIAGNOSIS — H0100A Unspecified blepharitis right eye, upper and lower eyelids: Secondary | ICD-10-CM | POA: Diagnosis not present

## 2021-01-06 ENCOUNTER — Other Ambulatory Visit: Payer: Self-pay | Admitting: *Deleted

## 2021-01-06 DIAGNOSIS — E559 Vitamin D deficiency, unspecified: Secondary | ICD-10-CM

## 2021-02-02 DIAGNOSIS — Z8719 Personal history of other diseases of the digestive system: Secondary | ICD-10-CM | POA: Diagnosis not present

## 2021-02-02 DIAGNOSIS — I517 Cardiomegaly: Secondary | ICD-10-CM | POA: Diagnosis not present

## 2021-02-02 DIAGNOSIS — R42 Dizziness and giddiness: Secondary | ICD-10-CM | POA: Diagnosis not present

## 2021-02-02 DIAGNOSIS — N809 Endometriosis, unspecified: Secondary | ICD-10-CM | POA: Diagnosis not present

## 2021-02-02 DIAGNOSIS — Z800 Family history of malignant neoplasm of digestive organs: Secondary | ICD-10-CM | POA: Diagnosis not present

## 2021-02-02 DIAGNOSIS — R0602 Shortness of breath: Secondary | ICD-10-CM | POA: Diagnosis not present

## 2021-02-02 DIAGNOSIS — Z882 Allergy status to sulfonamides status: Secondary | ICD-10-CM | POA: Diagnosis not present

## 2021-02-02 DIAGNOSIS — I4891 Unspecified atrial fibrillation: Secondary | ICD-10-CM | POA: Diagnosis not present

## 2021-02-02 DIAGNOSIS — Z90710 Acquired absence of both cervix and uterus: Secondary | ICD-10-CM | POA: Diagnosis not present

## 2021-02-02 DIAGNOSIS — E8940 Asymptomatic postprocedural ovarian failure: Secondary | ICD-10-CM | POA: Diagnosis not present

## 2021-02-02 DIAGNOSIS — R002 Palpitations: Secondary | ICD-10-CM | POA: Diagnosis not present

## 2021-02-02 DIAGNOSIS — E876 Hypokalemia: Secondary | ICD-10-CM | POA: Diagnosis not present

## 2021-02-03 DIAGNOSIS — R002 Palpitations: Secondary | ICD-10-CM | POA: Diagnosis not present

## 2021-02-03 DIAGNOSIS — N809 Endometriosis, unspecified: Secondary | ICD-10-CM | POA: Diagnosis not present

## 2021-02-03 DIAGNOSIS — R42 Dizziness and giddiness: Secondary | ICD-10-CM | POA: Diagnosis not present

## 2021-02-03 DIAGNOSIS — E876 Hypokalemia: Secondary | ICD-10-CM | POA: Diagnosis not present

## 2021-02-03 DIAGNOSIS — I4891 Unspecified atrial fibrillation: Secondary | ICD-10-CM | POA: Diagnosis not present

## 2021-02-04 ENCOUNTER — Telehealth: Payer: Self-pay | Admitting: *Deleted

## 2021-02-04 DIAGNOSIS — I4891 Unspecified atrial fibrillation: Secondary | ICD-10-CM

## 2021-02-04 NOTE — Telephone Encounter (Signed)
Referral placed. Please make sure that we get the records to send on to [facility]

## 2021-02-04 NOTE — Telephone Encounter (Signed)
I attempted to find through care everywhere, unable. Pt aware to have hosp there fax [facility] the records.  Aware they will call with appt

## 2021-02-04 NOTE — Telephone Encounter (Signed)
While at the [facility] Pt had an attack of A- FIb. She is at [facility] [place]. She is released today and will need to follow up with Cardio next week.  They would like a referral to [facility] = Drs recommended were Will Camnitz or Lars Mage.  Can we place referral? I can't see the notes in care everywhere, I did request the record through care everywhere.

## 2021-02-07 ENCOUNTER — Telehealth: Payer: Self-pay | Admitting: Family Medicine

## 2021-02-07 ENCOUNTER — Telehealth: Payer: Self-pay | Admitting: Cardiology

## 2021-02-07 NOTE — Telephone Encounter (Signed)
Pt called in and would like to know if her records were rec'd.  She stated her daughter faxed them on Friday?    Best number [phone]

## 2021-02-07 NOTE — Telephone Encounter (Signed)
Referral notes sent from [facility] in [facility], Phone #: [phone], Fax #: [phone]   Notes sent to scheduling, gave a copy to [facility] and put other copy in file cabinet.

## 2021-02-08 NOTE — Telephone Encounter (Signed)
Pt notified that we received records.

## 2021-02-21 ENCOUNTER — Telehealth: Payer: Self-pay | Admitting: Family Medicine

## 2021-02-21 NOTE — Telephone Encounter (Signed)
  Prescription Request  02/21/2021  What is the name of the medication or equipment? Vitamin D, Ergocalciferol, (DRISDOL) 1.25 MG (50000 UNIT) CAPS capsule  Have you contacted your pharmacy to request a refill? (if applicable) yes  Which [facility] would you like [facility]? [facility]   Patient notified that their request is being sent to the clinical staff for review and that they should receive a response within 2 business days.

## 2021-02-22 ENCOUNTER — Other Ambulatory Visit: Payer: Self-pay | Admitting: Family Medicine

## 2021-02-22 DIAGNOSIS — E559 Vitamin D deficiency, unspecified: Secondary | ICD-10-CM

## 2021-02-22 MED ORDER — VITAMIN D (ERGOCALCIFEROL) 1.25 MG (50000 UNIT) PO CAPS: ORAL_CAPSULE | ORAL | 3 refills | Status: DC

## 2021-02-25 ENCOUNTER — Encounter: Payer: Self-pay | Admitting: Cardiology

## 2021-02-25 ENCOUNTER — Ambulatory Visit (INDEPENDENT_AMBULATORY_CARE_PROVIDER_SITE_OTHER): Payer: Medicare HMO | Admitting: Cardiology

## 2021-02-25 ENCOUNTER — Other Ambulatory Visit: Payer: Self-pay

## 2021-02-25 VITALS — BP 138/80 | HR 76 | Ht 64.0 in | Wt 165.2 lb

## 2021-02-25 DIAGNOSIS — I480 Paroxysmal atrial fibrillation: Secondary | ICD-10-CM

## 2021-02-25 DIAGNOSIS — G4730 Sleep apnea, unspecified: Secondary | ICD-10-CM

## 2021-02-25 DIAGNOSIS — R0681 Apnea, not elsewhere classified: Secondary | ICD-10-CM

## 2021-02-25 DIAGNOSIS — R0683 Snoring: Secondary | ICD-10-CM

## 2021-02-25 NOTE — Progress Notes (Signed)
Electrophysiology Office Note   Date:  02/25/2021   ID:  Krista Henderson, DOB 1951/02/25, MRN [id number]  PCP:  [name], DO  Cardiologist:   Primary Electrophysiologist:  [name] [name], MD    Chief Complaint: AF   History of Present Illness: Krista Henderson is a 70 y.o. female who is being seen today for the evaluation of AF at the request of [name], Ashly M, DO. Presenting today for electrophysiology evaluation.  She presented to the emergency room in [place] with dizziness, vomiting, and palpitations.  She was found to have rapid atrial fibrillation.  She was started on Cardizem and Eliquis.  She converted spontaneously to sinus rhythm.  Today, she denies symptoms of palpitations, chest pain, shortness of breath, orthopnea, PND, lower extremity edema, claudication, dizziness, presyncope, syncope, bleeding, or neurologic sequela. The patient is tolerating medications without difficulties.  Since she was in the hospital she has had no further episodes of atrial fibrillation.  She has been dizzy which she attributes to her Eliquis and Cardizem.  She did have an episode of palpitations approximately 5 years ago, but has not had any since then and this most recent episode.  During her most recent episode, she was sitting on the beach, did not have any water, and drink 3 diet sodas.  She went into her house to clean as she was getting ready to leave the next day.  She laid down to sleep and got quite dizzy and nauseous.  She then developed palpitations.   Past Medical History:  Diagnosis Date   Abnormal Pap smear of cervix    Blood transfusion without reported diagnosis    Bone spur 09/2017   Left hip   Endometriosis    Hx of removal of cyst    right ear   Hyperlipidemia    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   WBC 3,000 08/30/11 52 P 43 L   Migraine without aura    Vitamin D deficiency    Past Surgical History:  Procedure Laterality Date   APPENDECTOMY  1965   7th  grade in the '60's   CESAREAN SECTION     x2 '84 & '85   COLONOSCOPY  10/13/2013   DIAGNOSTIC LAPAROSCOPY     POLYPECTOMY     TOTAL ABDOMINAL HYSTERECTOMY W/ BILATERAL SALPINGOOPHORECTOMY  08/1989   endometriosis     Current Outpatient Medications  Medication Sig Dispense Refill   Calcium Citrate-Vitamin D (CALCIUM + D PO) Take by mouth.     Probiotic Product (PROBIOTIC DAILY PO) Take by mouth.     Vitamin D, Ergocalciferol, (DRISDOL) 1.25 MG (50000 UNIT) CAPS capsule TAKE 1 CAPSULE BY MOUTH ONE TIME PER WEEK 12 capsule 3   No current facility-administered medications for this visit.    Allergies:   Codeine, Influenza vaccines, and Sulfa antibiotics   Social History:  The patient  reports that she has never smoked. She has never used smokeless tobacco. She reports that she does not drink alcohol and does not use drugs.   Family History:  The patient's family history includes Breast cancer in her paternal aunt; Cancer in her paternal aunt; Colon cancer (age of onset: 32) in her father; Colon polyps in her father; Diabetes in her mother; Berenice Primas' disease in her daughter; Hypertension in her mother; Osteoporosis in her mother; Stomach cancer in her paternal grandfather.    ROS:  Please see the history of present illness.   Otherwise, review of systems is positive for none.  All other systems are reviewed and negative.    PHYSICAL EXAM: VS:  BP 138/80   Pulse 76   Ht 5\' 4"  (1.626 m)   Wt 165 lb 3.2 oz (74.9 kg)   LMP 08/18/1989   SpO2 97%   BMI 28.36 kg/m  , BMI Body mass index is 28.36 kg/m. GEN: Well nourished, well developed, in no acute distress  HEENT: normal  Neck: no JVD, carotid bruits, or masses Cardiac: RRR; no murmurs, rubs, or gallops,no edema  Respiratory:  clear to auscultation bilaterally, normal work of breathing GI: soft, nontender, nondistended, + BS MS: no deformity or atrophy  Skin: warm and dry Neuro:  Strength and sensation are intact Psych: euthymic  mood, full affect  EKG:  EKG is ordered today. Personal review of the ekg ordered shows sinus rhythm, rate of 76  Recent Labs: 09/06/2020: ALT 14; BUN 12; Creatinine, Ser 0.89; Hemoglobin 13.1; Platelets 216; Potassium 4.1; Sodium 138    Lipid Panel     Component Value Date/Time   CHOL 226 (H) 09/06/2020 1016   TRIG 164 (H) 09/06/2020 1016   TRIG 190 (H) 08/20/2013 0835   HDL 52 09/06/2020 1016   HDL 57 08/20/2013 0835   CHOLHDL 4.3 09/06/2020 1016   LDLCALC 145 (H) 09/06/2020 1016   LDLCALC 115 (H) 08/20/2013 0835     Wt Readings from Last 3 Encounters:  02/25/21 165 lb 3.2 oz (74.9 kg)  09/08/20 169 lb 12.8 oz (77 kg)  07/16/20 166 lb (75.3 kg)      Other studies Reviewed: Additional studies/ records that were reviewed today include: TTE 02/02/2021 Review of the above records today demonstrates:  LV cavity size is normal Mild concentric LVH Ejection fraction 60 to 65% Right ventricle mildly dilated Normal pulmonary artery pressure   ASSESSMENT AND PLAN:  1.  Paroxysmal atrial fibrillation: Currently on Eliquis and diltiazem.  CHA2DS2-VASc of 1.  Due to her low stroke risk and only 1 episode of atrial fibrillation, [name] stop Eliquis and diltiazem.  If she has further episodes, she may require monitoring.  She may decide to get a cardia monitor.  2.  Snoring: Per her husband, has quite a bit of snoring and holds her breath when she sleeps.  We [name] plan for sleep study.  Current medicines are reviewed at length with the patient today.   The patient does not have concerns regarding her medicines.  The following changes were made today:  none  Labs/ tests ordered today include:  Orders Placed This Encounter  Procedures   Ambulatory referral to Sleep Studies   EKG 12-Lead     Disposition:   FU with Lindsey Demonte 6 months  Signed, [name] [name], MD  02/25/2021 9:30 AM     [place] [address]  [id number] [phone] (office) [phone] (fax)

## 2021-02-25 NOTE — Patient Instructions (Addendum)
Medication Instructions:  Your physician recommends that you continue on your current medications as directed. Please refer to the Current Medication list given to you today.  *If you need a refill on your cardiac medications before your next appointment, please call your pharmacy*   Lab Work: None ordered   Testing/Procedures: Your physician has recommended that you have a sleep study. This test records several body functions during sleep, including: brain activity, eye movement, oxygen and carbon dioxide blood levels, heart rate and rhythm, breathing rate and rhythm, the flow of air through your mouth and nose, snoring, body muscle movements, and chest and belly movement.  The office will call to schedule this testing   Follow-Up: At [facility], you and your health needs are our priority.  As part of our continuing mission to provide you with exceptional heart care, we have created designated Provider Care Teams.  These Care Teams include your primary Cardiologist (physician) and Advanced Practice Providers (APPs -  Physician Assistants and Nurse Practitioners) who all work together to provide you with the care you need, when you need it.  Your next appointment:   6 month(s)  The format for your next appointment:   In Person  Provider:   Allegra Lai, MD    Thank you for choosing [facility]!!   Trinidad Curet, RN [phone]   Other Instructions  Sleep Study, Adult A sleep study (polysomnogram) is a series of tests done while you are sleeping. A sleep study records your brain waves, heart rate, breathing rate, oxygen level, and eye and legmovements. A sleep study helps your health care provider: See how well you sleep. Diagnose a sleep disorder. Determine how severe your sleep disorder is. Create a plan to treat your sleep disorder. Your health care provider may recommend a sleep study if you: Feel sleepy on most days. Snore loudly while sleeping. Have  unusual behaviors while you sleep, such as walking. Have brief periods in which you stop breathing during sleep (sleepapnea). Fall asleep suddenly during the day (narcolepsy). Have trouble falling asleep or staying asleep (insomnia). Feel like you need to move your legs when trying to fall asleep (restless legs syndrome). Move your legs by flexing and extending them regularly while asleep (periodic limb movement disorder). Act out your dreams while you sleep (sleep behavior disorder). Feel like you cannot move when you first wake up (sleep paralysis). What tests are part of a sleep study? Most sleep studies record the following during sleep: Brain activity. Eye movements. Heart rate and rhythm. Breathing rate and rhythm. Blood-oxygen level. Blood pressure. Chest and belly movement as you breathe. Arm and leg movements. Snoring or other noises. Body position. Where are sleep studies done? Sleep studies are done at sleep centers. A sleep center may be inside [place], office, or clinic. The room where you have the study may look like a hospital room or a hotel room. The health care providers doing the study may come in and out of the room during the study. Most of the time, they will be in another room monitoringyour test as you sleep. How are sleep studies done? Most sleep studies are done during a normal period of time for a full night of sleep. You will arrive at the study center in the evening and go home in themorning. Before the test Bring your pajamas and toothbrush with you to the sleep study. Do not have caffeine on the day of your sleep study. Do not drink alcohol on the  day of your sleep study. Your health care provider will let you know if you should stop taking any of your regular medicines before the test. During the test     Round, sticky patches with sensors attached to recording wires (electrodes) are placed on your scalp, face, chest, and limbs. Wires from all the  electrodes and sensors run from your bed to a computer. The wires can be taken off and put back on if you need to get out of bed to go to the bathroom. A sensor is placed over your nose to measure airflow. A finger clip is put on your finger or ear to measure your blood oxygen level (pulse oximetry). A belt is placed around your belly and a belt is placed around your chest to measure breathing movements. If you have signs of the sleep disorder called sleep apnea during your test, you may get a treatment mask to wear for the second half of the night. The mask provides positive airway pressure (PAP) to help you breathe better during sleep. This may greatly improve your sleep apnea. You will then have all tests done again with the mask in place to see if your measurements and recordings change. After the test A medical doctor who specializes in sleep will evaluate the results of your sleep study and share them with you and your primary health care provider. Based on your results, your medical history, and a physical exam, you may be diagnosed with a sleep disorder, such as: Sleep apnea. Restless legs syndrome. Sleep-related behavior disorder. Sleep-related movement disorders. Sleep-related seizure disorders. Your health care team will help determine your treatment options based on your diagnosis. This may include: Improving your sleep habits (sleep hygiene). Wearing a continuous positive airway pressure (CPAP) or bi-level positive airway pressure (BPAP) mask. Wearing an oral device at night to improve breathing and reduce snoring. Taking medicines. Follow these instructions at home: Take over-the-counter and prescription medicines only as told by your health care provider. If you are instructed to use a CPAP or BPAP mask, make sure you use it nightly as directed. Make any lifestyle changes that your health care provider recommends. If you were given a device to open your airway while you sleep,  use it only as told by your health care provider. Do not use any tobacco products, such as cigarettes, chewing tobacco, and e-cigarettes. If you need help quitting, ask your health care provider. Keep all follow-up visits as told by your health care provider. This is important. Summary A sleep study (polysomnogram) is a series of tests done while you are sleeping. It shows how well you sleep. Most sleep studies are done over one full night of sleep. You will arrive at the study center in the evening and go home in the morning. If you have signs of the sleep disorder called sleep apnea during your test, you may get a treatment mask to wear for the second half of the night. A medical doctor who specializes in sleep will evaluate the results of your sleep study and share them with your primary health care provider. This information is not intended to replace advice given to you by your health care provider. Make sure you discuss any questions you have with your healthcare provider. Document Revised: 10/10/2019 Document Reviewed: 10/02/2017 Elsevier Patient Education  2022 [employer].

## 2021-02-25 NOTE — Addendum Note (Signed)
Addended by: Stanton Kidney on: 02/25/2021 09:55 AM   Modules accepted: Orders

## 2021-03-03 NOTE — Telephone Encounter (Signed)
Pt is not symptomatic w/ low BPs. States they are only low in the mornings and then improve by lunchtime. She also states it is  low when she is walking. She wonders if her BP cuff is reading correctly. She is going to check it over the weekend. Aware I will discuss Diltiazem w/ pharmD next week as well

## 2021-03-04 ENCOUNTER — Telehealth: Payer: Self-pay | Admitting: *Deleted

## 2021-03-04 NOTE — Telephone Encounter (Signed)
-----   Message from Stanton Kidney, RN sent at 02/25/2021  1:53 PM EDT ----- Regarding: sleep study ordered sleep study ordered  Snoring Apnea Afib

## 2021-03-04 NOTE — Telephone Encounter (Signed)
Prior Authorization for split night sleep study sent to [facility] via web portal. Tracking Number [id number].

## 2021-03-09 NOTE — Telephone Encounter (Signed)
Received a automated call from [place] with Auth approval for sleep study. Auth # [id number]. Valid dates 03/09/21 to 09/05/21. Message routed to [facility] for scheduling.

## 2021-03-23 NOTE — Telephone Encounter (Signed)
Patient is scheduled for lab study on 04/11/21. Patient understands her sleep study will be done at [facility] lab. Patient understands she will receive a sleep packet in a week or so. Patient understands to call if she does not receive the sleep packet in a timely manner. Patient agrees with treatment and thanked me for call.

## 2021-04-01 ENCOUNTER — Other Ambulatory Visit: Payer: Self-pay

## 2021-04-01 ENCOUNTER — Encounter: Payer: Self-pay | Admitting: Family Medicine

## 2021-04-01 ENCOUNTER — Ambulatory Visit (INDEPENDENT_AMBULATORY_CARE_PROVIDER_SITE_OTHER): Payer: Medicare HMO | Admitting: Family Medicine

## 2021-04-01 VITALS — BP 141/87 | HR 85 | Temp 97.3°F | Ht 64.0 in | Wt 164.2 lb

## 2021-04-01 DIAGNOSIS — M940 Chondrocostal junction syndrome [Tietze]: Secondary | ICD-10-CM | POA: Diagnosis not present

## 2021-04-01 DIAGNOSIS — I480 Paroxysmal atrial fibrillation: Secondary | ICD-10-CM | POA: Insufficient documentation

## 2021-04-01 DIAGNOSIS — L819 Disorder of pigmentation, unspecified: Secondary | ICD-10-CM | POA: Diagnosis not present

## 2021-04-01 NOTE — Progress Notes (Signed)
Subjective: CC: Chest lump PCP: [name], DO Krista Henderson is a 70 y.o. female presenting to clinic today for:  1.  Chest wall Patient reports that she noticed a lump in the middle of her chest a couple of weeks ago.  This was not noticeable until she had an EKG done and she was trying to walk off the adhesion from the leads.  She thought it seemed bigger 2-1/2 weeks ago but still notices it now.  Does not report any pain or drainage.  She read online that it might be lymphoma but does not report any types of symptoms today.  After further questioning she admits that she had been doing a lot of planting and moving heavy pots prior to onset.  2.  Skin lesion Patient reports a pin point skin lesion on the right side of her head.  Apparently, one of her family members friends recently discovered he had melanoma and had a similar lesion.  She will be getting checked out by her dermatologist on August 1.  3.  Atrial fibrillation Patient with new onset atrial fibrillation, followed by Dr. Curt Bears.  She was recently discontinued off of Eliquis and diltiazem given her low CHA2DS2-VASc score.  She continues to have low blood pressures each morning with systolics typically in the low 100s.  However, when she has been seen at her appointments her blood pressure is always raised.  She thinks this is secondary to situational anxiety but we will plan to bring over her blood pressure monitor for reevaluation.  Initially she had some dizziness with looking up but that has since resolved.   ROS: Per HPI  Allergies  Allergen Reactions   Codeine     REACTION: rash/nausea/vomiting   Influenza Vaccines     "injections site turned black color and had a hen size egg lump at site". Per pt she said not sure if the reactions was from a spider bite or flu shot.     Sulfa Antibiotics Nausea And Vomiting   Past Medical History:  Diagnosis Date   Abnormal Pap smear of cervix    Blood transfusion  without reported diagnosis    Bone spur 09/2017   Left hip   Endometriosis    Hx of removal of cyst    right ear   Hyperlipidemia    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   WBC 3,000 08/30/11 52 P 43 L   Migraine without aura    Vitamin D deficiency     Current Outpatient Medications:    Calcium Citrate-Vitamin D (CALCIUM + D PO), Take by mouth., Disp: , Rfl:    Probiotic Product (PROBIOTIC DAILY PO), Take by mouth., Disp: , Rfl:    Vitamin D, Ergocalciferol, (DRISDOL) 1.25 MG (50000 UNIT) CAPS capsule, TAKE 1 CAPSULE BY MOUTH ONE TIME PER WEEK, Disp: 12 capsule, Rfl: 3 Social History   Socioeconomic History   Marital status: Married    Spouse name: Doctor, general practice   Number of children: Not on file   Years of education: Not on file   Highest education level: Not on file  Occupational History   Not on file  Tobacco Use   Smoking status: Never   Smokeless tobacco: Never  Vaping Use   Vaping Use: Never used  Substance and Sexual Activity   Alcohol use: No   Drug use: No   Sexual activity: Yes    Partners: Male    Birth control/protection: Surgical    Comment: Hysterectomy  Other Topics Concern  Not on file  Social History Narrative   Married to [place].  Her daughter is expecting a child in the next couple of days.  They will have a total of 2 grandchildren, both girls.   Social Determinants of Health   Financial Resource Strain: Not on file  Food Insecurity: Not on file  Transportation Needs: Not on file  Physical Activity: Not on file  Stress: Not on file  Social Connections: Not on file  Intimate Partner Violence: Not on file   Family History  Problem Relation Age of Onset   Diabetes Mother    Osteoporosis Mother    Hypertension Mother    Colon cancer Father 20   Colon polyps Father    Stomach cancer Paternal Grandfather    Breast cancer Paternal Aunt        45's   Cancer Paternal Aunt        breast   Graves' disease Daughter    Esophageal cancer Neg Hx    Rectal cancer Neg Hx      Objective: Office vital signs reviewed. BP (!) 141/87   Pulse 85   Temp (!) 97.3 F (36.3 C)   Ht 5\' 4"  (1.626 m)   Wt 164 lb 3.2 oz (74.5 kg)   LMP 08/18/1989   SpO2 96%   BMI 28.18 kg/m   Physical Examination:  General: Awake, alert, well nourished, No acute distress Chest: Prominent manubrium that is mildly tender to palpation but demonstrates no palpable bony abnormalities otherwise.  No appreciable fluctuance, induration or central punctum to suggest associated cyst. Cardio: regular rate  Pulm: Normal work of breathing on room air Skin: very small dark brown pigmented lesion noted along the right temple that appears to have regular borders and even coloration  Assessment/ Plan: 70 y.o. female   Costochondritis  Paroxysmal A-fib ([place])  Change in pigmented skin lesion of face  The lump on the chest I think is consistent with a costochondritis.  Reassuring that it is getting smaller.  It does not appear to be consistent with a cyst.  We discussed that if it is persistent or develops any concerning features low threshold to perform chest x-ray and/or soft tissue ultrasound.  I have very little reason to believe that she has a lymphoma.  However if clinical suspicion arose, would certainly look into that as a differential  She is closely monitored by her cardiologist for the paroxysmal atrial fibrillation.  She is remained in sinus rhythm since her hospitalization and stays off of medications for now given low CHA2DS2-VASc score.  I agree that blood pressure needs to be rechecked along with her own cuff in our office to ensure reliability of her monitor  Agree with checkup with dermatology for pigmented skin lesion.  While it does not appear to be unusual on exam because it is pigmented and new I think it needs to be checked out.  No orders of the defined types were placed in this encounter.  No orders of the defined types were placed in this encounter.    [name], DO [place] [phone]

## 2021-05-03 ENCOUNTER — Encounter: Payer: Self-pay | Admitting: Family Medicine

## 2021-05-03 ENCOUNTER — Ambulatory Visit (INDEPENDENT_AMBULATORY_CARE_PROVIDER_SITE_OTHER): Payer: Medicare HMO | Admitting: Family Medicine

## 2021-05-03 ENCOUNTER — Other Ambulatory Visit: Payer: Self-pay

## 2021-05-03 VITALS — BP 125/73 | HR 80 | Temp 97.3°F | Ht 64.0 in | Wt 162.4 lb

## 2021-05-03 DIAGNOSIS — H65191 Other acute nonsuppurative otitis media, right ear: Secondary | ICD-10-CM

## 2021-05-03 DIAGNOSIS — R42 Dizziness and giddiness: Secondary | ICD-10-CM | POA: Diagnosis not present

## 2021-05-03 MED ORDER — CETIRIZINE HCL 10 MG PO TABS: 10.0000 mg | ORAL_TABLET | Freq: Every day | ORAL | 0 refills | Status: DC

## 2021-05-03 MED ORDER — FLUTICASONE PROPIONATE 50 MCG/ACT NA SUSP: 2.0000 | Freq: Every day | NASAL | 0 refills | Status: DC

## 2021-05-03 MED ORDER — MECLIZINE HCL 25 MG PO TABS: 25.0000 mg | ORAL_TABLET | Freq: Three times a day (TID) | ORAL | 0 refills | Status: DC | PRN

## 2021-05-03 NOTE — Progress Notes (Signed)
Assessment & Plan:  1. Acute effusion of right ear Discussed effusion as the cause of her dizziness. Flonase in the AM and Zyrtec in the PM. - fluticasone (FLONASE) 50 MCG/ACT nasal spray; Place 2 sprays into both nostrils daily.  Dispense: 16 g; Refill: 0 - cetirizine (ZYRTEC ALLERGY) 10 MG tablet; Take 1 tablet (10 mg total) by mouth daily.  Dispense: 30 tablet; Refill: 0  2. Dizziness Use Meclizine as needed. - meclizine (ANTIVERT) 25 MG tablet; Take 1 tablet (25 mg total) by mouth 3 (three) times daily as needed for dizziness.  Dispense: 30 tablet; Refill: 0   Follow up plan: Return if symptoms worsen or fail to improve.  Hendricks Limes, MSN, APRN, FNP-C [facility]  Subjective:   Patient ID: Krista Henderson, female    DOB: 1951-05-07, 70 y.o.   MRN: [id number]  HPI: Krista Henderson is a 70 y.o. female presenting on 05/03/2021 for Dizziness (X 2 days)  Patient reports dizziness x2 days that mostly occurs when she is lying down in the bed. It gets worse when she turns over. Turning is worse when going to the right side. This has occurred in the past.   ROS: Negative unless specifically indicated above in HPI.   Relevant past medical history reviewed and updated as indicated.   Allergies and medications reviewed and updated.   Current Outpatient Medications:    Calcium Citrate-Vitamin D (CALCIUM + D PO), Take by mouth., Disp: , Rfl:    Probiotic Product (PROBIOTIC DAILY PO), Take by mouth., Disp: , Rfl:    Vitamin D, Ergocalciferol, (DRISDOL) 1.25 MG (50000 UNIT) CAPS capsule, TAKE 1 CAPSULE BY MOUTH ONE TIME PER WEEK, Disp: 12 capsule, Rfl: 3  Allergies  Allergen Reactions   Codeine     REACTION: rash/nausea/vomiting   Influenza Vaccines     "injections site turned black color and had a hen size egg lump at site". Per pt she said not sure if the reactions was from a spider bite or flu shot.     Sulfa Antibiotics Nausea And Vomiting    Objective:    BP 125/73   Pulse 80   Temp (!) 97.3 F (36.3 C) (Temporal)   Ht '5\' 4"'$  (1.626 m)   Wt 162 lb 6.4 oz (73.7 kg)   LMP 08/18/1989   BMI 27.88 kg/m    Physical Exam Vitals reviewed.  Constitutional:      General: She is not in acute distress.    Appearance: Normal appearance. She is not ill-appearing, toxic-appearing or diaphoretic.  HENT:     Head: Normocephalic and atraumatic.     Right Ear: Ear canal and external ear normal. A middle ear effusion is present. There is no impacted cerumen. Tympanic membrane is not erythematous.     Left Ear: Tympanic membrane, ear canal and external ear normal. There is no impacted cerumen. Tympanic membrane is not erythematous.  Eyes:     General: No scleral icterus.       Right eye: No discharge.        Left eye: No discharge.     Conjunctiva/sclera: Conjunctivae normal.  Cardiovascular:     Rate and Rhythm: Normal rate.  Pulmonary:     Effort: Pulmonary effort is normal. No respiratory distress.  Musculoskeletal:        General: Normal range of motion.     Cervical back: Normal range of motion.  Skin:    General: Skin is warm and dry.  Capillary Refill: Capillary refill takes less than 2 seconds.  Neurological:     General: No focal deficit present.     Mental Status: She is alert and oriented to person, place, and time. Mental status is at baseline.  Psychiatric:        Mood and Affect: Mood normal.        Behavior: Behavior normal.        Thought Content: Thought content normal.        Judgment: Judgment normal.

## 2021-05-10 ENCOUNTER — Other Ambulatory Visit: Payer: Medicare HMO

## 2021-05-10 ENCOUNTER — Other Ambulatory Visit: Payer: Self-pay | Admitting: *Deleted

## 2021-05-10 ENCOUNTER — Telehealth: Payer: Self-pay | Admitting: Family Medicine

## 2021-05-10 DIAGNOSIS — Z0000 Encounter for general adult medical examination without abnormal findings: Secondary | ICD-10-CM

## 2021-05-10 DIAGNOSIS — E7800 Pure hypercholesterolemia, unspecified: Secondary | ICD-10-CM

## 2021-05-10 DIAGNOSIS — E559 Vitamin D deficiency, unspecified: Secondary | ICD-10-CM

## 2021-05-10 DIAGNOSIS — I480 Paroxysmal atrial fibrillation: Secondary | ICD-10-CM

## 2021-05-10 NOTE — Telephone Encounter (Signed)
Placing orders

## 2021-05-11 ENCOUNTER — Other Ambulatory Visit: Payer: Self-pay

## 2021-05-11 ENCOUNTER — Other Ambulatory Visit: Payer: Medicare HMO

## 2021-05-11 DIAGNOSIS — Z0000 Encounter for general adult medical examination without abnormal findings: Secondary | ICD-10-CM

## 2021-05-11 DIAGNOSIS — I480 Paroxysmal atrial fibrillation: Secondary | ICD-10-CM

## 2021-05-11 DIAGNOSIS — E559 Vitamin D deficiency, unspecified: Secondary | ICD-10-CM | POA: Diagnosis not present

## 2021-05-11 DIAGNOSIS — E7800 Pure hypercholesterolemia, unspecified: Secondary | ICD-10-CM | POA: Diagnosis not present

## 2021-05-12 LAB — THYROID PANEL WITH TSH
Free Thyroxine Index: 2.1 (ref 1.2–4.9)
T3 Uptake Ratio: 24 % (ref 24–39)
T4, Total: 8.9 ug/dL (ref 4.5–12.0)
TSH: 3.13 u[IU]/mL (ref 0.450–4.500)

## 2021-05-12 LAB — CBC WITH DIFFERENTIAL/PLATELET
Basophils Absolute: 0 10*3/uL (ref 0.0–0.2)
Basos: 1 %
EOS (ABSOLUTE): 0.1 10*3/uL (ref 0.0–0.4)
Eos: 2 %
Hematocrit: 38.7 % (ref 34.0–46.6)
Hemoglobin: 13.2 g/dL (ref 11.1–15.9)
Immature Grans (Abs): 0 10*3/uL (ref 0.0–0.1)
Immature Granulocytes: 0 %
Lymphocytes Absolute: 1.6 10*3/uL (ref 0.7–3.1)
Lymphs: 42 %
MCH: 32.7 pg (ref 26.6–33.0)
MCHC: 34.1 g/dL (ref 31.5–35.7)
MCV: 96 fL (ref 79–97)
Monocytes Absolute: 0.4 10*3/uL (ref 0.1–0.9)
Monocytes: 9 %
Neutrophils Absolute: 1.8 10*3/uL (ref 1.4–7.0)
Neutrophils: 46 %
Platelets: 176 10*3/uL (ref 150–450)
RBC: 4.04 x10E6/uL (ref 3.77–5.28)
RDW: 11.8 % (ref 11.7–15.4)
WBC: 3.9 10*3/uL (ref 3.4–10.8)

## 2021-05-12 LAB — CMP14+EGFR
ALT: 12 IU/L (ref 0–32)
AST: 16 IU/L (ref 0–40)
Albumin/Globulin Ratio: 2.6 — ABNORMAL HIGH (ref 1.2–2.2)
Albumin: 4.6 g/dL (ref 3.8–4.8)
Alkaline Phosphatase: 78 IU/L (ref 44–121)
BUN/Creatinine Ratio: 14 (ref 12–28)
BUN: 12 mg/dL (ref 8–27)
Bilirubin Total: 1 mg/dL (ref 0.0–1.2)
CO2: 22 mmol/L (ref 20–29)
Calcium: 9.6 mg/dL (ref 8.7–10.3)
Chloride: 103 mmol/L (ref 96–106)
Creatinine, Ser: 0.85 mg/dL (ref 0.57–1.00)
Globulin, Total: 1.8 g/dL (ref 1.5–4.5)
Glucose: 106 mg/dL — ABNORMAL HIGH (ref 65–99)
Potassium: 4.5 mmol/L (ref 3.5–5.2)
Sodium: 140 mmol/L (ref 134–144)
Total Protein: 6.4 g/dL (ref 6.0–8.5)
eGFR: 74 mL/min/{1.73_m2} (ref >59)

## 2021-05-12 LAB — LIPID PANEL
Chol/HDL Ratio: 4.3 ratio (ref 0.0–4.4)
Cholesterol, Total: 234 mg/dL — ABNORMAL HIGH (ref 100–199)
HDL: 55 mg/dL (ref >39)
LDL Chol Calc (NIH): 155 mg/dL — ABNORMAL HIGH (ref 0–99)
Triglycerides: 132 mg/dL (ref 0–149)
VLDL Cholesterol Cal: 24 mg/dL (ref 5–40)

## 2021-05-12 LAB — VITAMIN D 25 HYDROXY (VIT D DEFICIENCY, FRACTURES): Vit D, 25-Hydroxy: 37.2 ng/mL (ref 30.0–100.0)

## 2021-05-17 ENCOUNTER — Encounter: Payer: Self-pay | Admitting: Family Medicine

## 2021-05-17 ENCOUNTER — Ambulatory Visit (INDEPENDENT_AMBULATORY_CARE_PROVIDER_SITE_OTHER): Payer: Medicare HMO

## 2021-05-17 ENCOUNTER — Other Ambulatory Visit: Payer: Self-pay

## 2021-05-17 ENCOUNTER — Ambulatory Visit (INDEPENDENT_AMBULATORY_CARE_PROVIDER_SITE_OTHER): Payer: Medicare HMO | Admitting: Family Medicine

## 2021-05-17 VITALS — BP 133/74 | HR 89 | Temp 97.1°F | Ht 64.0 in | Wt 163.8 lb

## 2021-05-17 DIAGNOSIS — H6591 Unspecified nonsuppurative otitis media, right ear: Secondary | ICD-10-CM | POA: Diagnosis not present

## 2021-05-17 DIAGNOSIS — Z0000 Encounter for general adult medical examination without abnormal findings: Secondary | ICD-10-CM

## 2021-05-17 DIAGNOSIS — R0789 Other chest pain: Secondary | ICD-10-CM

## 2021-05-17 DIAGNOSIS — R739 Hyperglycemia, unspecified: Secondary | ICD-10-CM | POA: Diagnosis not present

## 2021-05-17 DIAGNOSIS — E7800 Pure hypercholesterolemia, unspecified: Secondary | ICD-10-CM

## 2021-05-17 DIAGNOSIS — R072 Precordial pain: Secondary | ICD-10-CM | POA: Diagnosis not present

## 2021-05-17 DIAGNOSIS — I480 Paroxysmal atrial fibrillation: Secondary | ICD-10-CM | POA: Diagnosis not present

## 2021-05-17 DIAGNOSIS — Z0001 Encounter for general adult medical examination with abnormal findings: Secondary | ICD-10-CM | POA: Diagnosis not present

## 2021-05-17 NOTE — Patient Instructions (Signed)
Cholesterol panel is ordered.  Come in fasting in 3 months for this.  Can get shingles vaccine at that time if you'd like as well  Otitis Media With Effusion, Adult Otitis media with effusion (OME) is inflammation and fluid (effusion) in the middle ear without having an ear infection. The middle ear is the space behind the eardrum. The middle ear is connected to the back of the throat by a narrow tube (eustachian tube). Normally the eustachian tube drains fluid out of the middle ear. A swollen eustachian tube can become blocked and cause fluid to collect in the middle ear. OME often goes away without treatment. Sometimes OME can lead to hearing problems and recurrent acute ear infections (acute otitis media). These conditions may require treatment. What are the causes? OME is caused by a blocked eustachian tube. This can result from: Allergies. Upper respiratory infections. Enlarged adenoids. The adenoids are areas of soft tissue located high in the back of the throat, behind the nose and the roof of the mouth. They are part of the body's natural defense system (immune system). Rapid changes in pressure, like when an airplane is descending or during scuba diving. In some cases, the cause of this condition is not known. What are the signs or symptoms? Common symptoms of this condition include: A feeling of fullness in your ear. Decreased hearing in the affected ear. Fluid draining into the ear canal. Pain in the ear. In some cases, there are no symptoms. How is this diagnosed? A health care provider can diagnose OME based on signs and symptoms of the condition. Your provider will also do a physical exam to check for fluid behind the eardrum. During the exam, your health care provider will use an instrument called an otoscope to look in your ear. Your health care provider may do other tests, such as: A hearing test. A tympanogram. This is a test that shows how well the eardrum moves in response  to air pressure in the ear canal. It provides a graph for your health care provider to review. A pneumatic otoscopy. This is a test to check how your eardrum moves in response to changes in pressure. It is done by squeezing a small amount of air into the ear. How is this treated? Treatment for OME depends on the cause of the condition and the severity of symptoms. The first step is often waiting to see if the fluid drains on its own in a few weeks. Home care treatment may include: Over-the-counter pain relievers. A warm, moist cloth placed over the ear. Severe cases may require a procedure to insert tubes in the ears (tympanostomy tubes) to drain the fluid. Follow these instructions at home: Take over-the-counter and prescription medicines only as told by your health care provider. Keep all follow-up visits. Contact a health care provider if: You have pain that gets worse. Hearing in your affected ear gets worse. You have fluid draining from your ear canal. You have dizziness. You develop a fever. Get help right away if: You develop a severe headache. You completely lose hearing in the affected ear. You have bleeding from your ear canal. You have sudden and severe pain in your ear. These symptoms may represent a serious problem that is an emergency. Do not wait to see if the symptoms will go away. Get medical help right away. Call your local emergency services (911 in the [place] Do not drive yourself to the hospital. Summary Otitis media with effusion (OME) is inflammation and  fluid (effusion) in the middle ear without having an ear infection. A swollen eustachian tube can become blocked and cause fluid to collect in the middle ear. Treatment for OME depends on the cause of the condition and the severity of symptoms. Many times, treatment is not needed because the fluid drains on its own in a few weeks. Sometimes OME can lead to hearing problems and recurrent acute ear infections (acute  otitis media), which may require treatment. This information is not intended to replace advice given to you by your health care provider. Make sure you discuss any questions you have with your health care provider. Document Revised: 12/30/2020 Document Reviewed: 12/30/2020 Elsevier Patient Education  [place].

## 2021-05-17 NOTE — Progress Notes (Signed)
Krista Henderson is a 70 y.o. female presents to office today for annual physical exam examination.    Concerns today include: 1.  HLD Would really like to work on lifestyle modification before pursuing any cholesterol medications.  She admits that she has not been exercising and eating like she normally does.  No chest pain, shortness of breath.  2.  Dizziness Patient reports intermittent dizziness.  She has had a little lesion in the right ear that she is worried about.  She has history of surgical resection of a cyst on that ear before.  She finds that the dizziness seems to be more pronounced when she lies on that side.  Noted to have an inner ear effusion a few weeks ago and was placed on Zyrtec, Flonase and Antivert.  She discontinued the Antivert about a week and change ago and notes that the dizziness returned to a lesser degree.  3.  Pain of her manubrium Patient reports that she continues to have some sternal pain and a palpable lump there.   Last eye exam: Up-to-date Last dental exam: Up-to-date Last colonoscopy: Up-to-date Last mammogram: Up-to-date Last pap smear: Up-to-date Refills needed today: N/A Immunizations needed: Immunization History  Administered Date(s) Administered   Influenza Inj Mdck Quad Pf 06/24/2019, 08/03/2020   Influenza, Quadrivalent, Recombinant, Inj, Pf 07/31/2016, 09/25/2017, 08/08/2018   Influenza,inj,Quad PF,6+ Mos 07/15/2014   Pneumococcal Conjugate-13 12/27/2016   Pneumococcal Polysaccharide-23 08/08/2018   Tdap 05/31/2011   Zoster, Live 10/17/2011     Past Medical History:  Diagnosis Date   Abnormal Pap smear of cervix    Blood transfusion without reported diagnosis    Bone spur 09/2017   Left hip   Endometriosis    Hx of removal of cyst    right ear   Hyperlipidemia    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   WBC 3,000 08/30/11 52 P 43 L   Migraine without aura    Vitamin D deficiency    Social History   Socioeconomic History   Marital  status: Married    Spouse name: Doctor, general practice   Number of children: Not on file   Years of education: Not on file   Highest education level: Not on file  Occupational History   Not on file  Tobacco Use   Smoking status: Never   Smokeless tobacco: Never  Vaping Use   Vaping Use: Never used  Substance and Sexual Activity   Alcohol use: No   Drug use: No   Sexual activity: Yes    Partners: Male    Birth control/protection: Surgical    Comment: Hysterectomy  Other Topics Concern   Not on file  Social History Narrative   Married to [place].  Her daughter is expecting a child in the next couple of days.  They will have a total of 2 grandchildren, both girls.   Social Determinants of Health   Financial Resource Strain: Not on file  Food Insecurity: Not on file  Transportation Needs: Not on file  Physical Activity: Not on file  Stress: Not on file  Social Connections: Not on file  Intimate Partner Violence: Not on file   Past Surgical History:  Procedure Laterality Date   APPENDECTOMY  1965   7th grade in the '60's   [place]     x2 '84 & '85   COLONOSCOPY  10/13/2013   DIAGNOSTIC LAPAROSCOPY     POLYPECTOMY     TOTAL ABDOMINAL HYSTERECTOMY W/ BILATERAL SALPINGOOPHORECTOMY  08/1989   endometriosis  Family History  Problem Relation Age of Onset   Diabetes Mother    Osteoporosis Mother    Hypertension Mother    Colon cancer Father 44   Colon polyps Father    Stomach cancer Paternal Grandfather    Breast cancer Paternal Aunt        22's   Cancer Paternal Aunt        breast   Graves' disease Daughter    Esophageal cancer Neg Hx    Rectal cancer Neg Hx     Current Outpatient Medications:    Calcium Citrate-Vitamin D (CALCIUM + D PO), Take by mouth., Disp: , Rfl:    cetirizine (ZYRTEC ALLERGY) 10 MG tablet, Take 1 tablet (10 mg total) by mouth daily., Disp: 30 tablet, Rfl: 0   fluticasone (FLONASE) 50 MCG/ACT nasal spray, Place 2 sprays into both nostrils daily.,  Disp: 16 g, Rfl: 0   meclizine (ANTIVERT) 25 MG tablet, Take 1 tablet (25 mg total) by mouth 3 (three) times daily as needed for dizziness., Disp: 30 tablet, Rfl: 0   Probiotic Product (PROBIOTIC DAILY PO), Take by mouth., Disp: , Rfl:    Vitamin D, Ergocalciferol, (DRISDOL) 1.25 MG (50000 UNIT) CAPS capsule, TAKE 1 CAPSULE BY MOUTH ONE TIME PER WEEK, Disp: 12 capsule, Rfl: 3  Allergies  Allergen Reactions   Codeine     REACTION: rash/nausea/vomiting   Influenza Vaccines     "injections site turned black color and had a hen size egg lump at site". Per pt she said not sure if the reactions was from a spider bite or flu shot.     Sulfa Antibiotics Nausea And Vomiting     ROS: Review of Systems Pertinent items noted in HPI and remainder of comprehensive ROS otherwise negative.    Physical exam BP 133/74   Pulse 89   Temp (!) 97.1 F (36.2 C)   Ht '5\' 4"'$  (1.626 m)   Wt 163 lb 12.8 oz (74.3 kg)   LMP 08/18/1989   SpO2 97%   BMI 28.12 kg/m  General appearance: alert, cooperative, appears stated age, and no distress Head: Normocephalic, without obvious abnormality, atraumatic Eyes: negative findings: lids and lashes normal, conjunctivae and sclerae normal, corneas clear, and pupils equal, round, reactive to light and accomodation Ears:  Small clear effusion noted behind the right tympanic membrane.  No purulence noted.  Minimal surrounding erythema.  She has a very tiny cystic lesion noted over the antitragus Nose: Nares normal. Septum midline. Mucosa normal. No drainage or sinus tenderness. Throat: lips, mucosa, and tongue normal; teeth and gums normal Neck: no adenopathy, supple, symmetrical, trachea midline, and thyroid not enlarged, symmetric, no tenderness/mass/nodules Back: symmetric, no curvature. ROM normal. No CVA tenderness. Lungs: clear to auscultation bilaterally Heart: regular rate and rhythm, S1, S2 normal, no murmur, click, rub or gallop Abdomen: soft, non-tender; bowel  sounds normal; no masses,  no organomegaly Extremities: extremities normal, atraumatic, no cyanosis or edema Pulses: 2+ and symmetric Skin: Skin color, texture, turgor normal. No rashes or lesions Lymph nodes: Cervical, supraclavicular, and axillary nodes normal. Neurologic: Grossly normal Chest: Tender to palpation over the manubrium.  No palpable deformity  Depression screen [facility] 2/9 05/17/2021 05/03/2021 04/01/2021  Decreased Interest 0 0 0  Down, Depressed, Hopeless 0 0 0  PHQ - 2 Score 0 0 0  Altered sleeping 0 0 -  Tired, decreased energy 0 0 -  Change in appetite 0 0 -  Feeling bad or failure about yourself  0 0 -  Trouble concentrating 0 0 -  Moving slowly or fidgety/restless 0 0 -  Suicidal thoughts 0 0 -  PHQ-9 Score 0 0 -  Difficult doing work/chores Not difficult at all Not difficult at all -   No results found.    Assessment/ Plan: Krista Henderson here for annual physical exam.   Annual physical exam  Paroxysmal A-fib ([place])  Pure hypercholesterolemia  Elevated serum glucose  Pain of sternum - Plan: DG Chest 2 View  Fluid level behind tympanic membrane of right ear  Rate and rhythm controlled today  Patient to work on lifestyle modification we will plan to repeat fasting lipid panel in 3 months  Serum glucose slightly elevated.  Plan to recheck this in 3 months as well  Check chest x-ray given ongoing pain of her manubrium.  It is not tender to palpation today but no overt abnormalities appreciated  Reinforced continued use of oral antihistamines.  Discussed osteopathic maneuver for eustachian tube drainage.  Gricel Copen M. Lajuana Ripple, DO

## 2021-05-18 ENCOUNTER — Encounter: Payer: Self-pay | Admitting: Family Medicine

## 2021-05-25 ENCOUNTER — Other Ambulatory Visit: Payer: Self-pay | Admitting: Family Medicine

## 2021-05-25 DIAGNOSIS — H65191 Other acute nonsuppurative otitis media, right ear: Secondary | ICD-10-CM

## 2021-05-30 ENCOUNTER — Other Ambulatory Visit: Payer: Self-pay | Admitting: Family Medicine

## 2021-05-30 DIAGNOSIS — H65191 Other acute nonsuppurative otitis media, right ear: Secondary | ICD-10-CM

## 2021-06-27 DIAGNOSIS — X32XXXD Exposure to sunlight, subsequent encounter: Secondary | ICD-10-CM | POA: Diagnosis not present

## 2021-06-27 DIAGNOSIS — Z1283 Encounter for screening for malignant neoplasm of skin: Secondary | ICD-10-CM | POA: Diagnosis not present

## 2021-06-27 DIAGNOSIS — L570 Actinic keratosis: Secondary | ICD-10-CM | POA: Diagnosis not present

## 2021-06-27 DIAGNOSIS — D225 Melanocytic nevi of trunk: Secondary | ICD-10-CM | POA: Diagnosis not present

## 2021-06-27 DIAGNOSIS — L820 Inflamed seborrheic keratosis: Secondary | ICD-10-CM | POA: Diagnosis not present

## 2021-07-18 ENCOUNTER — Ambulatory Visit (HOSPITAL_BASED_OUTPATIENT_CLINIC_OR_DEPARTMENT_OTHER): Payer: Medicare HMO | Admitting: Obstetrics & Gynecology

## 2021-07-19 ENCOUNTER — Ambulatory Visit (INDEPENDENT_AMBULATORY_CARE_PROVIDER_SITE_OTHER): Payer: Medicare HMO

## 2021-07-19 ENCOUNTER — Other Ambulatory Visit: Payer: Self-pay

## 2021-07-19 DIAGNOSIS — Z23 Encounter for immunization: Secondary | ICD-10-CM

## 2021-08-01 ENCOUNTER — Telehealth: Payer: Self-pay | Admitting: Family Medicine

## 2021-08-01 ENCOUNTER — Other Ambulatory Visit: Payer: Self-pay

## 2021-08-01 ENCOUNTER — Ambulatory Visit: Payer: Medicare HMO | Attending: Cardiology | Admitting: Cardiology

## 2021-08-01 DIAGNOSIS — I480 Paroxysmal atrial fibrillation: Secondary | ICD-10-CM | POA: Diagnosis not present

## 2021-08-01 DIAGNOSIS — G4730 Sleep apnea, unspecified: Secondary | ICD-10-CM | POA: Insufficient documentation

## 2021-08-01 DIAGNOSIS — R0681 Apnea, not elsewhere classified: Secondary | ICD-10-CM

## 2021-08-01 DIAGNOSIS — R0683 Snoring: Secondary | ICD-10-CM | POA: Diagnosis not present

## 2021-08-01 NOTE — Telephone Encounter (Signed)
Patient is scheduled for lab study on 08/01/21. Patient understands her sleep study will be done at [facility] sleep lab. Patient understands she will receive a sleep packet in a week or so. Patient understands to call if she does not receive the sleep packet in a timely manner.  Left detailed message on voicemail with date and time of titration and informed patient to call back to confirm or reschedule.

## 2021-08-01 NOTE — Telephone Encounter (Signed)
Left message for patient to call back and schedule Medicare Annual Wellness Visit (AWV) either virtually or phone I left my number for patient to call [phone]. Also left office #  *due 06/18/2017 awvi per [place] please schedule at anytime with health coach  This should be a 45 minute visit.

## 2021-08-15 ENCOUNTER — Other Ambulatory Visit: Payer: Self-pay | Admitting: Obstetrics & Gynecology

## 2021-08-15 DIAGNOSIS — Z0000 Encounter for general adult medical examination without abnormal findings: Secondary | ICD-10-CM

## 2021-08-15 NOTE — Procedures (Signed)
      Patient Name: Stephonie, Wilcoxen Study Date:08/01/2021 Gender: Female D.O.B: December 21, 1950 Age (years): 11 Referring Provider: Will Camnitz Height (inches): 65 Interpreting Physician: Fransico Him MD, ABSM Weight (lbs): 160 RPSGT: Rosebud Poles BMI: 27 MRN: [id number] Neck Size: 14.50   CLINICAL INFORMATION Sleep Study Type: NPSG   Indication for sleep study: Snoring, Witnessed Apneas   Epworth Sleepiness Score: 1   SLEEP STUDY TECHNIQUE As per the AASM Manual for the Scoring of Sleep and Associated Events v2.3 (April 2016) with a hypopnea requiring 4% desaturations.   The channels recorded and monitored were frontal, central and occipital EEG, electrooculogram (EOG), submentalis EMG (chin), nasal and oral airflow, thoracic and abdominal wall motion, anterior tibialis EMG, snore microphone, electrocardiogram, and pulse oximetry.   MEDICATIONS Medications self-administered by patient taken the night of the study : N/A   SLEEP ARCHITECTURE The study was initiated at 11:03:53 PM and ended at 5:44:13 AM.   Sleep onset time was 87.8 minutes and the sleep efficiency was 67.3%. The total sleep time was 269.5 minutes.   Stage REM latency was 166.0 minutes.   The patient spent 6.12% of the night in stage N1 sleep, 72.54% in stage N2 sleep, 7.98% in stage N3 and 13.4% in REM.   Alpha intrusion was absent.   Supine sleep was 0.00%.   RESPIRATORY PARAMETERS The overall apnea/hypopnea index (AHI) was 4.5 per hour. There were 14 total apneas, including 14 obstructive, 0 central and 0 mixed apneas. There were 6 hypopneas and 8 RERAs.   The AHI during Stage REM sleep was 25.0 per hour.   AHI while supine was N/A per hour.   The mean oxygen saturation was 94.05%. The minimum SpO2 during sleep was 88.00%.   loud snoring was noted during this study.   CARDIAC DATA The 2 lead EKG demonstrated sinus rhythm. The mean heart rate was 63.42 beats per minute. Other EKG findings include:  PVCs.   LEG MOVEMENT DATA The total PLMS were 405 with a resulting PLMS index of 90.17. Associated arousal with leg movement index was 1.8 .   IMPRESSIONS - No significant obstructive sleep apnea occurred during this study (AHI = 4.5/h). - The patient had minimal or no oxygen desaturation during the study (Min O2 = 88.00%) - The patient snored with loud snoring volume. - EKG findings include PVCs. - Severe periodic limb movements of sleep occurred during the study. No significant associated arousals.   DIAGNOSIS - Normal study   RECOMMENDATIONS - Avoid alcohol, sedatives and other CNS depressants that may worsen sleep apnea and disrupt normal sleep architecture. - Sleep hygiene should be reviewed to assess factors that may improve sleep quality. - Weight management and regular exercise should be initiated or continued if appropriate. - Recommend Itamar home sleep study given significant OSA during REM sleep and reduced REM sleep time during study.    [Electronically signed] 08/15/2021 08:01 AM   Fransico Him MD, ABSM Diplomate, American Board of Sleep Medicine

## 2021-08-15 NOTE — Progress Notes (Signed)
    Patient Name: Krista Henderson, Krista Henderson Study Date:08/01/2021 Gender: Female D.O.B: [birth year] Age (years): 37 Referring Provider: Will Camnitz Height (inches): 65 Interpreting Physician: Fransico Him MD, ABSM Weight (lbs): 160 RPSGT: Rosebud Poles BMI: 27 MRN: [id number] Neck Size: 14.50  CLINICAL INFORMATION Sleep Study Type: NPSG  Indication for sleep study: Snoring, Witnessed Apneas  Epworth Sleepiness Score: 1  SLEEP STUDY TECHNIQUE As per the AASM Manual for the Scoring of Sleep and Associated Events v2.3 (April 2016) with a hypopnea requiring 4% desaturations.  The channels recorded and monitored were frontal, central and occipital EEG, electrooculogram (EOG), submentalis EMG (chin), nasal and oral airflow, thoracic and abdominal wall motion, anterior tibialis EMG, snore microphone, electrocardiogram, and pulse oximetry.  MEDICATIONS Medications self-administered by patient taken the night of the study : N/A  SLEEP ARCHITECTURE The study was initiated at 11:03:53 PM and ended at 5:44:13 AM.  Sleep onset time was 87.8 minutes and the sleep efficiency was 67.3%. The total sleep time was 269.5 minutes.  Stage REM latency was 166.0 minutes.  The patient spent 6.12% of the night in stage N1 sleep, 72.54% in stage N2 sleep, 7.98% in stage N3 and 13.4% in REM.  Alpha intrusion was absent.  Supine sleep was 0.00%.  RESPIRATORY PARAMETERS The overall apnea/hypopnea index (AHI) was 4.5 per hour. There were 14 total apneas, including 14 obstructive, 0 central and 0 mixed apneas. There were 6 hypopneas and 8 RERAs.  The AHI during Stage REM sleep was 25.0 per hour.  AHI while supine was N/A per hour.  The mean oxygen saturation was 94.05%. The minimum SpO2 during sleep was 88.00%.  loud snoring was noted during this study.  CARDIAC DATA The 2 lead EKG demonstrated sinus rhythm. The mean heart rate was 63.42 beats per minute. Other EKG findings include: PVCs.  LEG  MOVEMENT DATA The total PLMS were 405 with a resulting PLMS index of 90.17. Associated arousal with leg movement index was 1.8 .  IMPRESSIONS - No significant obstructive sleep apnea occurred during this study (AHI = 4.5/h). - The patient had minimal or no oxygen desaturation during the study (Min O2 = 88.00%) - The patient snored with loud snoring volume. - EKG findings include PVCs. - Severe periodic limb movements of sleep occurred during the study. No significant associated arousals.  DIAGNOSIS - Normal study  RECOMMENDATIONS - Avoid alcohol, sedatives and other CNS depressants that may worsen sleep apnea and disrupt normal sleep architecture. - Sleep hygiene should be reviewed to assess factors that may improve sleep quality. - Weight management and regular exercise should be initiated or continued if appropriate. - Recommend Itamar home sleep study given significant OSA during REM sleep and reduced REM sleep time during study.   [Electronically signed] 08/15/2021 08:01 AM  Fransico Him MD, ABSM Diplomate, American Board of Sleep Medicine

## 2021-08-17 ENCOUNTER — Telehealth: Payer: Self-pay | Admitting: Cardiology

## 2021-08-17 ENCOUNTER — Telehealth: Payer: Self-pay | Admitting: *Deleted

## 2021-08-17 ENCOUNTER — Ambulatory Visit
Admission: RE | Admit: 2021-08-17 | Discharge: 2021-08-17 | Disposition: A | Discharge status: Home / Self Care | Payer: Medicare HMO | Source: Ambulatory Visit | Attending: Obstetrics & Gynecology | Admitting: Obstetrics & Gynecology

## 2021-08-17 ENCOUNTER — Other Ambulatory Visit: Payer: Self-pay

## 2021-08-17 DIAGNOSIS — Z1231 Encounter for screening mammogram for malignant neoplasm of breast: Secondary | ICD-10-CM | POA: Diagnosis not present

## 2021-08-17 DIAGNOSIS — Z0000 Encounter for general adult medical examination without abnormal findings: Secondary | ICD-10-CM

## 2021-08-17 NOTE — Telephone Encounter (Signed)
-----   Message from Sueanne Margarita, MD sent at 08/15/2021  8:10 AM EST ----- Patient had overall normal sleep study but significant OSA during REM sleep.  She did not have a lot of REM sleep during her sleep study so I suspect she does have significant sleep apnea overall.  Please order an Itamar sleep study at home so that we can evaluate this further and hopefully she will have more REM sleep

## 2021-08-17 NOTE — Telephone Encounter (Signed)
The patient has been notified of the result and verbalized understanding.  All questions (if any) were answered. Marolyn Hammock, [place] 08/17/2021 6:26 PM

## 2021-08-17 NOTE — Telephone Encounter (Signed)
Patient is calling for her sleep results, she said you can send results through [place].  As well she would like to schedule her 6 month appt, she refused to schedule an April appt (that was the first available I had).  She states she just received letter in the mail to schedule her December appt.

## 2021-08-17 NOTE — Telephone Encounter (Signed)
The patient has been notified of the result and verbalized understanding.  All questions (if any) were answered. Marolyn Hammock, [place] 08/17/2021 6:19 PM   Patient wants to wait until she has her office visit with dr Curt Bears before she takes another sleep study.

## 2021-08-18 ENCOUNTER — Ambulatory Visit: Payer: Medicare HMO

## 2021-09-01 ENCOUNTER — Telehealth: Payer: Self-pay | Admitting: Family Medicine

## 2021-09-01 NOTE — Telephone Encounter (Signed)
Called pt to schedule AWV

## 2021-09-06 ENCOUNTER — Other Ambulatory Visit: Payer: Self-pay

## 2021-09-06 ENCOUNTER — Encounter (HOSPITAL_BASED_OUTPATIENT_CLINIC_OR_DEPARTMENT_OTHER): Payer: Self-pay | Admitting: Obstetrics & Gynecology

## 2021-09-06 ENCOUNTER — Ambulatory Visit (INDEPENDENT_AMBULATORY_CARE_PROVIDER_SITE_OTHER): Payer: Medicare HMO | Admitting: Obstetrics & Gynecology

## 2021-09-06 VITALS — BP 134/80 | HR 84 | Ht 64.0 in | Wt 164.2 lb

## 2021-09-06 DIAGNOSIS — Z90710 Acquired absence of both cervix and uterus: Secondary | ICD-10-CM

## 2021-09-06 DIAGNOSIS — M8580 Other specified disorders of bone density and structure, unspecified site: Secondary | ICD-10-CM | POA: Diagnosis not present

## 2021-09-06 DIAGNOSIS — L292 Pruritus vulvae: Secondary | ICD-10-CM

## 2021-09-06 DIAGNOSIS — Z87440 Personal history of urinary (tract) infections: Secondary | ICD-10-CM

## 2021-09-06 DIAGNOSIS — Z23 Encounter for immunization: Secondary | ICD-10-CM | POA: Diagnosis not present

## 2021-09-06 DIAGNOSIS — E559 Vitamin D deficiency, unspecified: Secondary | ICD-10-CM

## 2021-09-06 DIAGNOSIS — N952 Postmenopausal atrophic vaginitis: Secondary | ICD-10-CM

## 2021-09-06 DIAGNOSIS — Z01419 Encounter for gynecological examination (general) (routine) without abnormal findings: Secondary | ICD-10-CM

## 2021-09-06 NOTE — Patient Instructions (Signed)
Revaree- vaginal suppository Vit E suppository

## 2021-09-06 NOTE — Progress Notes (Signed)
70 y.o. G78P2002 Married White or Caucasian female here for breast and pelvic exam.  I am also following her for recurrent yeast and vulvar itching.  Pt needs to have bone density done.  This was ordered last year but she didn't have it done.  Discussed today.  Denies vaginal bleeding.  Had episode of a fib this summer.  Was evaluated and was in afib.  Went to hospital in [facility].  Was in the hospital for 3 days.  Went out of afib on her own.  Was on eliquis for about a month but it wasn't recommended long term for her.  Being followed by Dr. Curt Bears and has follow up in January.   Does have pain with intercourse due to dryness.  She's tried astroglide, coconut oil.  Other options discussed.  Patient's last menstrual period was 08/18/1989.          Sexually active: yes  H/O STD:  no  Health Maintenance: PCP:  Dr. Lajuana Ripple.  Last wellness appt was 04/2021.  Did blood work at that appt:  yes Vaccines are up to date:  no, she has not done Shingrix.  Needs Tdap today. Colonoscopy:  02/14/2019, follow up 3 years MMG:  08/17/2021 Negative BMD:  12/21/2015 Last pap smear:  n/a.     reports that she has never smoked. She has never used smokeless tobacco. She reports that she does not drink alcohol and does not use drugs.  Past Medical History:  Diagnosis Date   Abnormal Pap smear of cervix    Blood transfusion without reported diagnosis    Bone spur 09/2017   Left hip   Endometriosis    Hx of removal of cyst    right ear   Hyperlipidemia    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   WBC 3,000 08/30/11 52 P 43 L   Migraine without aura    Vitamin D deficiency     Past Surgical History:  Procedure Laterality Date   APPENDECTOMY  1965   7th grade in the '60's   CESAREAN SECTION     x2 '84 & '85   COLONOSCOPY  10/13/2013   DIAGNOSTIC LAPAROSCOPY     POLYPECTOMY     TOTAL ABDOMINAL HYSTERECTOMY W/ BILATERAL SALPINGOOPHORECTOMY  08/1989   endometriosis    Current Outpatient Medications  Medication Sig  Dispense Refill   Probiotic Product (PROBIOTIC DAILY PO) Take by mouth.     Vitamin D, Ergocalciferol, (DRISDOL) 1.25 MG (50000 UNIT) CAPS capsule TAKE 1 CAPSULE BY MOUTH ONE TIME PER WEEK 12 capsule 3   cetirizine (ZYRTEC) 10 MG tablet TAKE 1 TABLET BY MOUTH EVERY DAY (Patient not taking: Reported on 09/06/2021) 30 tablet 5   fluticasone (FLONASE) 50 MCG/ACT nasal spray SPRAY 2 SPRAYS INTO EACH NOSTRIL EVERY DAY (Patient not taking: Reported on 09/06/2021) 48 g 1   meclizine (ANTIVERT) 25 MG tablet Take 1 tablet (25 mg total) by mouth 3 (three) times daily as needed for dizziness. (Patient not taking: Reported on 09/06/2021) 30 tablet 0   No current facility-administered medications for this visit.    Family History  Problem Relation Age of Onset   Diabetes Mother    Osteoporosis Mother    Hypertension Mother    Colon cancer Father 75   Colon polyps Father    Stomach cancer Paternal Grandfather    Breast cancer Paternal Aunt        47's   Cancer Paternal Aunt        breast   Graves'  disease Daughter    Esophageal cancer Neg Hx    Rectal cancer Neg Hx     Review of Systems  All other systems reviewed and are negative.  Exam:   BP 134/80 (BP Location: Right Arm, Patient Position: Sitting, Cuff Size: Large)    Pulse 84    Ht 5\' 4"  (1.626 m)    Wt 164 lb 3.2 oz (74.5 kg)    LMP 08/18/1989    BMI 28.18 kg/m   Height: 5\' 4"  (162.6 cm)  General appearance: alert, cooperative and appears stated age Breasts: normal appearance, no masses or tenderness Abdomen: soft, non-tender; bowel sounds normal; no masses,  no organomegaly Lymph nodes: Cervical, supraclavicular, and axillary nodes normal.  No abnormal inguinal nodes palpated Neurologic: Grossly normal  Pelvic: External genitalia:  no lesions              Urethra:  normal appearing urethra with no masses, tenderness or lesions              Bartholins and Skenes: normal                 Vagina: normal appearing vagina with atrophic  changes and no discharge, no lesions              Cervix: absent              Pap taken: No. Bimanual Exam:  Uterus:  uterus absent              Adnexa: no mass, fullness, tenderness               Rectovaginal: Confirms               Anus:  normal sphincter tone, no lesions  Chaperone, Octaviano Batty, CMA, was present for exam.  Assessment/Plan: 1. Encntr for gyn exam (general) (routine) w/o abn findings - pap smear not indicated - MMG up to date - BMD ordered - tdap will be updated today - lab work from 04/2021 reviewed  2. Osteopenia, unspecified location - DG BONE DENSITY (DXA); Future  3. Vaginal atrophy - OTC Revaree and Vit E  4. Vitamin D deficiency - on prescription Vit D  5. H/o vulvar itching - exam normal, no recent itching  6. H/O: hysterectomy - h/o TAH/BSO due to endometriosis 12/90  7. History of recurrent UTIs - no recent infections

## 2021-09-08 ENCOUNTER — Encounter: Payer: Self-pay | Admitting: Nurse Practitioner

## 2021-09-08 ENCOUNTER — Ambulatory Visit (INDEPENDENT_AMBULATORY_CARE_PROVIDER_SITE_OTHER): Payer: Medicare HMO | Admitting: Nurse Practitioner

## 2021-09-08 DIAGNOSIS — U071 COVID-19: Secondary | ICD-10-CM

## 2021-09-08 MED ORDER — MOLNUPIRAVIR EUA 200MG CAPSULE: 4.0000 | ORAL_CAPSULE | Freq: Two times a day (BID) | ORAL | 0 refills | Status: AC

## 2021-09-08 NOTE — Patient Instructions (Signed)
COVID-19: Quarantine and Isolation °Quarantine °If you were exposed °Quarantine and stay away from others when you have been in close contact with someone who has COVID-19. °Isolate °If you are sick or test positive °Isolate when you are sick or when you have COVID-19, even if you don't have symptoms. °When to stay home °Calculating quarantine °The date of your exposure is considered day 0. Day 1 is the first full day after your last contact with a person who has had COVID-19. Stay home and away from other people for at least 5 days. Learn why CDC updated guidance for the general public. °IF YOU were exposed to COVID-19 and are NOT  °up to dateIF YOU were exposed to COVID-19 and are NOT on COVID-19 vaccinations °Quarantine for at least 5 days °Stay home °Stay home and quarantine for at least 5 full days. °Wear a well-fitting mask if you must be around others in your home. °Do not travel. °Get tested °Even if you don't develop symptoms, get tested at least 5 days after you last had close contact with someone with COVID-19. °After quarantine °Watch for symptoms °Watch for symptoms until 10 days after you last had close contact with someone with COVID-19. °Avoid travel °It is best to avoid travel until a full 10 days after you last had close contact with someone with COVID-19. °If you develop symptoms °Isolate immediately and get tested. Continue to stay home until you know the results. Wear a well-fitting mask around others. °Take precautions until day 10 °Wear a well-fitting mask °Wear a well-fitting mask for 10 full days any time you are around others inside your home or in public. Do not go to places where you are unable to wear a well-fitting mask. °If you must travel during days 6-10, take precautions. °Avoid being around people who are more likely to get very sick from COVID-19. °IF YOU were exposed to COVID-19 and are  °up to dateIF YOU were exposed to COVID-19 and are on COVID-19 vaccinations °No  quarantine °You do not need to stay home unless you develop symptoms. °Get tested °Even if you don't develop symptoms, get tested at least 5 days after you last had close contact with someone with COVID-19. °Watch for symptoms °Watch for symptoms until 10 days after you last had close contact with someone with COVID-19. °If you develop symptoms °Isolate immediately and get tested. Continue to stay home until you know the results. Wear a well-fitting mask around others. °Take precautions until day 10 °Wear a well-fitting mask °Wear a well-fitting mask for 10 full days any time you are around others inside your home or in public. Do not go to places where you are unable to wear a well-fitting mask. °Take precautions if traveling °Avoid being around people who are more likely to get very sick from COVID-19. °IF YOU were exposed to COVID-19 and had confirmed COVID-19 within the past 90 days (you tested positive using a viral test) °No quarantine °You do not need to stay home unless you develop symptoms. °Watch for symptoms °Watch for symptoms until 10 days after you last had close contact with someone with COVID-19. °If you develop symptoms °Isolate immediately and get tested. Continue to stay home until you know the results. Wear a well-fitting mask around others. °Take precautions until day 10 °Wear a well-fitting mask °Wear a well-fitting mask for 10 full days any time you are around others inside your home or in public. Do not go to places where you are   unable to wear a well-fitting mask. °Take precautions if traveling °Avoid being around people who are more likely to get very sick from COVID-19. °Calculating isolation °Day 0 is your first day of symptoms or a positive viral test. Day 1 is the first full day after your symptoms developed or your test specimen was collected. If you have COVID-19 or have symptoms, isolate for at least 5 days. °IF YOU tested positive for COVID-19 or have symptoms, regardless of  vaccination status °Stay home for at least 5 days °Stay home for 5 days and isolate from others in your home. °Wear a well-fitting mask if you must be around others in your home. °Do not travel. °Ending isolation if you had symptoms °End isolation after 5 full days if you are fever-free for 24 hours (without the use of fever-reducing medication) and your symptoms are improving. °Ending isolation if you did NOT have symptoms °End isolation after at least 5 full days after your positive test. °If you got very sick from COVID-19 or have a weakened immune system °You should isolate for at least 10 days. Consult your doctor before ending isolation. °Take precautions until day 10 °Wear a well-fitting mask °Wear a well-fitting mask for 10 full days any time you are around others inside your home or in public. Do not go to places where you are unable to wear a well-fitting mask. °Do not travel °Do not travel until a full 10 days after your symptoms started or the date your positive test was taken if you had no symptoms. °Avoid being around people who are more likely to get very sick from COVID-19. °Definitions °Exposure °Contact with someone infected with SARS-CoV-2, the virus that causes COVID-19, in a way that increases the likelihood of getting infected with the virus. °Close contact °A close contact is someone who was less than 6 feet away from an infected person (laboratory-confirmed or a clinical diagnosis) for a cumulative total of 15 minutes or more over a 24-hour period. For example, three individual 5-minute exposures for a total of 15 minutes. People who are exposed to someone with COVID-19 after they completed at least 5 days of isolation are not considered close contacts. °Quarantine °Quarantine is a strategy used to prevent transmission of COVID-19 by keeping people who have been in close contact with someone with COVID-19 apart from others. °Who does not need to quarantine? °If you had close contact with  someone with COVID-19 and you are in one of the following groups, you do not need to quarantine. °You are up to date with your COVID-19 vaccines. °You had confirmed COVID-19 within the last 90 days (meaning you tested positive using a viral test). °If you are up to date with COVID-19 vaccines, you should wear a well-fitting mask around others for 10 days from the date of your last close contact with someone with COVID-19 (the date of last close contact is considered day 0). Get tested at least 5 days after you last had close contact with someone with COVID-19. If you test positive or develop COVID-19 symptoms, isolate from other people and follow recommendations in the Isolation section below. If you tested positive for COVID-19 with a viral test within the previous 90 days and subsequently recovered and remain without COVID-19 symptoms, you do not need to quarantine or get tested after close contact. You should wear a well-fitting mask around others for 10 days from the date of your last close contact with someone with COVID-19 (the date of last   close contact is considered day 0). If you have COVID-19 symptoms, get tested and isolate from other people and follow recommendations in the Isolation section below. °Who should quarantine? °If you come into close contact with someone with COVID-19, you should quarantine if you are not up to date on COVID-19 vaccines. This includes people who are not vaccinated. °What to do for quarantine °Stay home and away from other people for at least 5 days (day 0 through day 5) after your last contact with a person who has COVID-19. The date of your exposure is considered day 0. Wear a well-fitting mask when around others at home, if possible. °For 10 days after your last close contact with someone with COVID-19, watch for fever (100.4°F or greater), cough, shortness of breath, or other COVID-19 symptoms. °If you develop symptoms, get tested immediately and isolate until you receive  your test results. If you test positive, follow isolation recommendations. °If you do not develop symptoms, get tested at least 5 days after you last had close contact with someone with COVID-19. °If you test negative, you can leave your home, but continue to wear a well-fitting mask when around others at home and in public until 10 days after your last close contact with someone with COVID-19. °If you test positive, you should isolate for at least 5 days from the date of your positive test (if you do not have symptoms). If you do develop COVID-19 symptoms, isolate for at least 5 days from the date your symptoms began (the date the symptoms started is day 0). Follow recommendations in the isolation section below. °If you are unable to get a test 5 days after last close contact with someone with COVID-19, you can leave your home after day 5 if you have been without COVID-19 symptoms throughout the 5-day period. Wear a well-fitting mask for 10 days after your date of last close contact when around others at home and in public. °Avoid people who are have weakened immune systems or are more likely to get very sick from COVID-19, and nursing homes and other high-risk settings, until after at least 10 days. °If possible, stay away from people you live with, especially people who are at higher risk for getting very sick from COVID-19, as well as others outside your home throughout the full 10 days after your last close contact with someone with COVID-19. °If you are unable to quarantine, you should wear a well-fitting mask for 10 days when around others at home and in public. °If you are unable to wear a mask when around others, you should continue to quarantine for 10 days. Avoid people who have weakened immune systems or are more likely to get very sick from COVID-19, and nursing homes and other high-risk settings, until after at least 10 days. °See additional information about travel. °Do not go to places where you are  unable to wear a mask, such as restaurants and some gyms, and avoid eating around others at home and at work until after 10 days after your last close contact with someone with COVID-19. °After quarantine °Watch for symptoms until 10 days after your last close contact with someone with COVID-19. °If you have symptoms, isolate immediately and get tested. °Quarantine in high-risk congregate settings °In certain congregate settings that have high risk of secondary transmission (such as correctional and detention facilities, homeless shelters, or cruise ships), CDC recommends a 10-day quarantine for residents, regardless of vaccination and booster status. During periods of critical staffing   shortages, facilities may consider shortening the quarantine period for staff to ensure continuity of operations. Decisions to shorten quarantine in these settings should be made in consultation with state, local, tribal, or territorial health departments and should take into consideration the context and characteristics of the facility. CDC's setting-specific guidance provides additional recommendations for these settings. °Isolation °Isolation is used to separate people with confirmed or suspected COVID-19 from those without COVID-19. People who are in isolation should stay home until it's safe for them to be around others. At home, anyone sick or infected should separate from others, or wear a well-fitting mask when they need to be around others. People in isolation should stay in a specific "sick room" or area and use a separate bathroom if available. Everyone who has presumed or confirmed COVID-19 should stay home and isolate from other people for at least 5 full days (day 0 is the first day of symptoms or the date of the day of the positive viral test for asymptomatic persons). They should wear a mask when around others at home and in public for an additional 5 days. People who are confirmed to have COVID-19 or are showing  symptoms of COVID-19 need to isolate regardless of their vaccination status. This includes: °People who have a positive viral test for COVID-19, regardless of whether or not they have symptoms. °People with symptoms of COVID-19, including people who are awaiting test results or have not been tested. People with symptoms should isolate even if they do not know if they have been in close contact with someone with COVID-19. °What to do for isolation °Monitor your symptoms. If you have an emergency warning sign (including trouble breathing), seek emergency medical care immediately. °Stay in a separate room from other household members, if possible. °Use a separate bathroom, if possible. °Take steps to improve ventilation at home, if possible. °Avoid contact with other members of the household and pets. °Don't share personal household items, like cups, towels, and utensils. °Wear a well-fitting mask when you need to be around other people. °Learn more about what to do if you are sick and how to notify your contacts. °Ending isolation for people who had COVID-19 and had symptoms °If you had COVID-19 and had symptoms, isolate for at least 5 days. To calculate your 5-day isolation period, day 0 is your first day of symptoms. Day 1 is the first full day after your symptoms developed. You can leave isolation after 5 full days. °You can end isolation after 5 full days if you are fever-free for 24 hours without the use of fever-reducing medication and your other symptoms have improved (Loss of taste and smell may persist for weeks or months after recovery and need not delay the end of isolation). °You should continue to wear a well-fitting mask around others at home and in public for 5 additional days (day 6 through day 10) after the end of your 5-day isolation period. If you are unable to wear a mask when around others, you should continue to isolate for a full 10 days. Avoid people who have weakened immune systems or are more  likely to get very sick from COVID-19, and nursing homes and other high-risk settings, until after at least 10 days. °If you continue to have fever or your other symptoms have not improved after 5 days of isolation, you should wait to end your isolation until you are fever-free for 24 hours without the use of fever-reducing medication and your other symptoms have improved.   Continue to wear a well-fitting mask through day 10. Contact your healthcare provider if you have questions. °See additional information about travel. °Do not go to places where you are unable to wear a mask, such as restaurants and some gyms, and avoid eating around others at home and at work until a full 10 days after your first day of symptoms. °If an individual has access to a test and wants to test, the best approach is to use an antigen test1 towards the end of the 5-day isolation period. Collect the test sample only if you are fever-free for 24 hours without the use of fever-reducing medication and your other symptoms have improved (loss of taste and smell may persist for weeks or months after recovery and need not delay the end of isolation). If your test result is positive, you should continue to isolate until day 10. If your test result is negative, you can end isolation, but continue to wear a well-fitting mask around others at home and in public until day 10. Follow additional recommendations for masking and avoiding travel as described above. °1As noted in the labeling for authorized over-the counter antigen tests: Negative results should be treated as presumptive. Negative results do not rule out SARS-CoV-2 infection and should not be used as the sole basis for treatment or patient management decisions, including infection control decisions. To improve results, antigen tests should be used twice over a three-day period with at least 24 hours and no more than 48 hours between tests. °Note that these recommendations on ending isolation  do not apply to people who are moderately ill or very sick from COVID-19 or have weakened immune systems. See section below for recommendations for when to end isolation for these groups. °Ending isolation for people who tested positive for COVID-19 but had no symptoms °If you test positive for COVID-19 and never develop symptoms, isolate for at least 5 days. Day 0 is the day of your positive viral test (based on the date you were tested) and day 1 is the first full day after the specimen was collected for your positive test. You can leave isolation after 5 full days. °If you continue to have no symptoms, you can end isolation after at least 5 days. °You should continue to wear a well-fitting mask around others at home and in public until day 10 (day 6 through day 10). If you are unable to wear a mask when around others, you should continue to isolate for 10 days. Avoid people who have weakened immune systems or are more likely to get very sick from COVID-19, and nursing homes and other high-risk settings, until after at least 10 days. °If you develop symptoms after testing positive, your 5-day isolation period should start over. Day 0 is your first day of symptoms. Follow the recommendations above for ending isolation for people who had COVID-19 and had symptoms. °See additional information about travel. °Do not go to places where you are unable to wear a mask, such as restaurants and some gyms, and avoid eating around others at home and at work until 10 days after the day of your positive test. °If an individual has access to a test and wants to test, the best approach is to use an antigen test1 towards the end of the 5-day isolation period. If your test result is positive, you should continue to isolate until day 10. If your test result is positive, you can also choose to test daily and if your test result   is negative, you can end isolation, but continue to wear a well-fitting mask around others at home and in  public until day 10. Follow additional recommendations for masking and avoiding travel as described above. °1As noted in the labeling for authorized over-the counter antigen tests: Negative results should be treated as presumptive. Negative results do not rule out SARS-CoV-2 infection and should not be used as the sole basis for treatment or patient management decisions, including infection control decisions. To improve results, antigen tests should be used twice over a three-day period with at least 24 hours and no more than 48 hours between tests. °Ending isolation for people who were moderately or very sick from COVID-19 or have a weakened immune system °People who are moderately ill from COVID-19 (experiencing symptoms that affect the lungs like shortness of breath or difficulty breathing) should isolate for 10 days and follow all other isolation precautions. To calculate your 10-day isolation period, day 0 is your first day of symptoms. Day 1 is the first full day after your symptoms developed. If you are unsure if your symptoms are moderate, talk to a healthcare provider for further guidance. °People who are very sick from COVID-19 (this means people who were hospitalized or required intensive care or ventilation support) and people who have weakened immune systems might need to isolate at home longer. They may also require testing with a viral test to determine when they can be around others. CDC recommends an isolation period of at least 10 and up to 20 days for people who were very sick from COVID-19 and for people with weakened immune systems. Consult with your healthcare provider about when you can resume being around other people. If you are unsure if your symptoms are severe or if you have a weakened immune system, talk to a healthcare provider for further guidance. °People who have a weakened immune system should talk to their healthcare provider about the potential for reduced immune responses to  COVID-19 vaccines and the need to continue to follow current prevention measures (including wearing a well-fitting mask and avoiding crowds and poorly ventilated indoor spaces) to protect themselves against COVID-19 until advised otherwise by their healthcare provider. Close contacts of immunocompromised people--including household members--should also be encouraged to receive all recommended COVID-19 vaccine doses to help protect these people. °Isolation in high-risk congregate settings °In certain high-risk congregate settings that have high risk of secondary transmission and where it is not feasible to cohort people (such as correctional and detention facilities, homeless shelters, and cruise ships), CDC recommends a 10-day isolation period for residents. During periods of critical staffing shortages, facilities may consider shortening the isolation period for staff to ensure continuity of operations. Decisions to shorten isolation in these settings should be made in consultation with state, local, tribal, or territorial health departments and should take into consideration the context and characteristics of the facility. CDC's setting-specific guidance provides additional recommendations for these settings. °This CDC guidance is meant to supplement--not replace--any federal, state, local, territorial, or tribal health and safety laws, rules, and regulations. °Recommendations for specific settings °These recommendations do not apply to healthcare professionals. For guidance specific to these settings, see °Healthcare professionals: Interim Guidance for Managing Healthcare Personnel with SARS-CoV-2 Infection or Exposure to SARS-CoV-2 °Patients, residents, and visitors to healthcare settings: Interim Infection Prevention and Control Recommendations for Healthcare Personnel During the Coronavirus Disease 2019 (COVID-19) Pandemic °Additional setting-specific guidance and recommendations are available. °These  recommendations on quarantine and isolation do apply to K[employer] School   settings. Additional guidance is available here: Overview of COVID-19 Quarantine for K-12 Schools °Travelers: Travel information and recommendations °Congregate facilities and other settings: guidance pages for community, work, and school settings °Ongoing COVID-19 exposure FAQs °I live with someone with COVID-19, but I cannot be separated from them. How do we manage quarantine in this situation? °It is very important for people with COVID-19 to remain apart from other people, if possible, even if they are living together. If separation of the person with COVID-19 from others that they live with is not possible, the other people that they live with will have ongoing exposure, meaning they will be repeatedly exposed until that person is no longer able to spread the virus to other people. In this situation, there are precautions you can take to limit the spread of COVID-19: °The person with COVID-19 and everyone they live with should wear a well-fitting mask inside the home. °If possible, one person should care for the person with COVID-19 to limit the number of people who are in close contact with the infected person. °Take steps to protect yourself and others to reduce transmission in the home: °Quarantine if you are not up to date with your COVID-19 vaccines. °Isolate if you are sick or tested positive for COVID-19, even if you don't have symptoms. °Learn more about the public health recommendations for testing, mask use and quarantine of close contacts, like yourself, who have ongoing exposure. These recommendations differ depending on your vaccination status. °What should I do if I have ongoing exposure to COVID-19 from someone I live with? °Recommendations for this situation depend on your vaccination status: °If you are not up to date on COVID-19 vaccines and have ongoing exposure to COVID-19, you should: °Begin quarantine immediately and  continue to quarantine throughout the isolation period of the person with COVID-19. °Continue to quarantine for an additional 5 days starting the day after the end of isolation for the person with COVID-19. °Get tested at least 5 days after the end of isolation of the infected person that lives with them. °If you test negative, you can leave the home but should continue to wear a well-fitting mask when around others at home and in public until 10 days after the end of isolation for the person with COVID-19. °Isolate immediately if you develop symptoms of COVID-19 or test positive. °If you are up to date with COVID-19 vaccines and have ongoing exposure to COVID-19, you should: °Get tested at least 5 days after your first exposure. A person with COVID-19 is considered infectious starting 2 days before they develop symptoms, or 2 days before the date of their positive test if they do not have symptoms. °Get tested again at least 5 days after the end of isolation for the person with COVID-19. °Wear a well-fitting mask when you are around the person with COVID-19, and do this throughout their isolation period. °Wear a well-fitting mask around others for 10 days after the infected person's isolation period ends. °Isolate immediately if you develop symptoms of COVID-19 or test positive. °What should I do if multiple people I live with test positive for COVID-19 at different times? °Recommendations for this situation depend on your vaccination status: °If you are not up to date with your COVID-19 vaccines, you should: °Quarantine throughout the isolation period of any infected person that you live with. °Continue to quarantine until 5 days after the end of isolation date for the most recently infected person that lives with you. For example, if   the last day of isolation of the person most recently infected with COVID-19 was June 30, the new 5-day quarantine period starts on July 1. °Get tested at least 5 days after the end  of isolation for the most recently infected person that lives with you. °Wear a well-fitting mask when you are around any person with COVID-19 while that person is in isolation. °Wear a well-fitting mask when you are around other people until 10 days after your last close contact. °Isolate immediately if you develop symptoms of COVID-19 or test positive. °If you are up to date with your COVID-19 vaccines, you should: °Get tested at least 5 days after your first exposure. A person with COVID-19 is considered infectious starting 2 days before they developed symptoms, or 2 days before the date of their positive test if they do not have symptoms. °Get tested again at least 5 days after the end of isolation for the most recently infected person that lives with you. °Wear a well-fitting mask when you are around any person with COVID-19 while that person is in isolation. °Wear a well-fitting mask around others for 10 days after the end of isolation for the most recently infected person that lives with you. For example, if the last day of isolation for the person most recently infected with COVID-19 was June 30, the new 10-day period to wear a well-fitting mask indoors in public starts on July 1. °Isolate immediately if you develop symptoms of COVID-19 or test positive. °I had COVID-19 and completed isolation. Do I have to quarantine or get tested if someone I live with gets COVID-19 shortly after I completed isolation? °No. If you recently completed isolation and someone that lives with you tests positive for the virus that causes COVID-19 shortly after the end of your isolation period, you do not have to quarantine or get tested as long as you do not develop new symptoms. Once all of the people that live together have completed isolation or quarantine, refer to the guidance below for new exposures to COVID-19. °If you had COVID-19 in the previous 90 days and then came into close contact with someone with COVID-19, you do  not have to quarantine or get tested if you do not have symptoms. But you should: °Wear a well-fitting mask indoors in public for 10 days after your last close contact. °Monitor for COVID-19 symptoms for 10 days from the date of your last close contact. °Isolate immediately and get tested if symptoms develop. °If more than 90 days have passed since your recovery from infection, follow CDC's recommendations for close contacts. These recommendations will differ depending on your vaccination status. °12/15/2020 °Content source: National Center for Immunization and Respiratory Diseases (NCIRD), Division of Viral Diseases °This information is not intended to replace advice given to you by your health care provider. Make sure you discuss any questions you have with your health care provider. °Document Revised: 04/20/2021 Document Reviewed: 04/20/2021 °Elsevier Patient Education © 2022 [employer] ° °

## 2021-09-08 NOTE — Progress Notes (Signed)
Virtual Visit  Note Due to COVID-19 pandemic this visit was conducted virtually. This visit type was conducted due to national recommendations for restrictions regarding the COVID-19 Pandemic (e.g. social distancing, sheltering in place) in an effort to limit this patient's exposure and mitigate transmission in our community. All issues noted in this document were discussed and addressed.  A physical exam was not performed with this format.  I connected with Krista Henderson on 09/08/21 at 8:00 by telephone and verified that I am speaking with the correct person using two identifiers. Krista Henderson is currently located at home and no one is currently with her during visit. The provider, Mary-Margaret Hassell Done, FNP is located in their office at time of visit.  I discussed the limitations, risks, security and privacy concerns of performing an evaluation and management service by telephone and the availability of in person appointments. I also discussed with the patient that there may be a patient responsible charge related to this service. The patient expressed understanding and agreed to proceed.   History and Present Illness:  Patient says that she developed fever of 100.8 with headache and congestion. Had developed into scratchy throat, right ear pain and fatigue. Home covid test was positive yesterday and today. She had COvid in Oct 2019 before vaccines came out and she was really sick then.     Review of Systems  Constitutional:  Positive for chills, fever and malaise/fatigue.  HENT:  Positive for congestion and ear pain. Negative for sore throat (scratchy).   Respiratory:  Positive for cough. Negative for sputum production and shortness of breath.   Musculoskeletal:  Negative for myalgias.  Neurological:  Positive for dizziness (slight) and headaches.    Observations/Objective: Alert and oriented- answers all questions appropriately No distress Raspy throat Slight cough  noted  Assessment and Plan: Krista Henderson in today with chief complaint of No chief complaint on file.   1. Lab test positive for detection of COVID-19 virus 1. Take meds as prescribed 2. Use a cool mist humidifier especially during the winter months and when heat has been humid. 3. Use saline nose sprays frequently 4. Saline irrigations of the nose can be very helpful if done frequently.  * 4X daily for 1 week*  * Use of a nettie pot can be helpful with this. Follow directions with this* 5. Drink plenty of fluids 6. Keep thermostat turn down low 7.For any cough or congestion- robitussin 8. For fever or aces or pains- take tylenol or ibuprofen appropriate for age and weight.  * for fevers greater than 101 orally you may alternate ibuprofen and tylenol every  3 hours.   Meds ordered this encounter  Medications   molnupiravir EUA (LAGEVRIO) 200 mg CAPS capsule    Sig: Take 4 capsules (800 mg total) by mouth 2 (two) times daily for 5 days.    Dispense:  40 capsule    Refill:  0    Order Specific Question:   Supervising Provider    Answer:   Caryl Pina A [[id number]]       Follow Up Instructions: Prn   I discussed the assessment and treatment plan with the patient. The patient was provided an opportunity to ask questions and all were answered. The patient agreed with the plan and demonstrated an understanding of the instructions.   The patient was advised to call back or seek an in-person evaluation if the symptoms worsen or if the condition fails to improve as anticipated.  The above assessment and management plan was discussed with the patient. The patient verbalized understanding of and has agreed to the management plan. Patient is aware to call the clinic if symptoms persist or worsen. Patient is aware when to return to the clinic for a follow-up visit. Patient educated on when it is appropriate to go to the emergency department.   Time call ended:  8:11  I provided 11  minutes of  non face-to-face time during this encounter.    Mary-Margaret Hassell Done, FNP

## 2021-09-20 ENCOUNTER — Ambulatory Visit (INDEPENDENT_AMBULATORY_CARE_PROVIDER_SITE_OTHER): Payer: Medicare HMO | Admitting: Family Medicine

## 2021-09-20 ENCOUNTER — Encounter: Payer: Self-pay | Admitting: Family Medicine

## 2021-09-20 VITALS — BP 122/78 | HR 85 | Temp 97.7°F | Ht 64.0 in | Wt 162.2 lb

## 2021-09-20 DIAGNOSIS — H65191 Other acute nonsuppurative otitis media, right ear: Secondary | ICD-10-CM

## 2021-09-20 DIAGNOSIS — R42 Dizziness and giddiness: Secondary | ICD-10-CM

## 2021-09-20 MED ORDER — CETIRIZINE HCL 10 MG PO TABS: 10.0000 mg | ORAL_TABLET | Freq: Every day | ORAL | 0 refills | Status: DC

## 2021-09-20 MED ORDER — FLUTICASONE PROPIONATE 50 MCG/ACT NA SUSP: 2.0000 | Freq: Every day | NASAL | 1 refills | Status: DC

## 2021-09-20 MED ORDER — MECLIZINE HCL 25 MG PO TABS: 25.0000 mg | ORAL_TABLET | Freq: Three times a day (TID) | ORAL | 0 refills | Status: DC | PRN

## 2021-09-20 NOTE — Progress Notes (Signed)
Assessment & Plan:  1. Acute effusion of right ear - fluticasone (FLONASE) 50 MCG/ACT nasal spray; Place 2 sprays into both nostrils daily.  Dispense: 48 g; Refill: 1 - cetirizine (ZYRTEC) 10 MG tablet; Take 1 tablet (10 mg total) by mouth daily.  Dispense: 30 tablet; Refill: 0  2. Dizziness - meclizine (ANTIVERT) 25 MG tablet; Take 1 tablet (25 mg total) by mouth 3 (three) times daily as needed for dizziness.  Dispense: 30 tablet; Refill: 0   Follow up plan: Return if symptoms worsen or fail to improve.  Hendricks Limes, MSN, APRN, FNP-C [facility]  Subjective:   Patient ID: Krista Henderson, female    DOB: 09-07-1951, 71 y.o.   MRN: [id number]  HPI: Krista Henderson is a 71 y.o. female presenting on 09/20/2021 for Ear Pain (Right. "Buzzing: in ear since 12/27) and Dizziness (Patient states when she lays down the bed spins 12/27 )  Patient tested positive for COVID on 09/06/2021 which was treated with molnupiravir. She has a history of vertigo and has been treated with meclizine in the past. Vertigo was better yesterday and even better today. When she lies down on the right side is when the dizziness comes back. Took meclizine 28th-30th. She has been taking Dramamine recently. Nothing today or yesterday.    ROS: Negative unless specifically indicated above in HPI.   Relevant past medical history reviewed and updated as indicated.   Allergies and medications reviewed and updated.   Current Outpatient Medications:    Vitamin D, Ergocalciferol, (DRISDOL) 1.25 MG (50000 UNIT) CAPS capsule, TAKE 1 CAPSULE BY MOUTH ONE TIME PER WEEK, Disp: 12 capsule, Rfl: 3   cetirizine (ZYRTEC) 10 MG tablet, TAKE 1 TABLET BY MOUTH EVERY DAY (Patient not taking: Reported on 09/06/2021), Disp: 30 tablet, Rfl: 5   fluticasone (FLONASE) 50 MCG/ACT nasal spray, SPRAY 2 SPRAYS INTO EACH NOSTRIL EVERY DAY (Patient not taking: Reported on 09/06/2021), Disp: 48 g, Rfl: 1   meclizine  (ANTIVERT) 25 MG tablet, Take 1 tablet (25 mg total) by mouth 3 (three) times daily as needed for dizziness. (Patient not taking: Reported on 09/06/2021), Disp: 30 tablet, Rfl: 0   Probiotic Product (PROBIOTIC DAILY PO), Take by mouth., Disp: , Rfl:   Allergies  Allergen Reactions   Codeine     REACTION: rash/nausea/vomiting   Influenza Vaccines     "injections site turned black color and had a hen size egg lump at site". Per pt she said not sure if the reactions was from a spider bite or flu shot.     Sulfa Antibiotics Nausea And Vomiting    Objective:   BP 122/78    Pulse 85    Temp 97.7 F (36.5 C) (Temporal)    Ht 5\' 4"  (1.626 m)    Wt 162 lb 3.2 oz (73.6 kg)    LMP 08/18/1989    SpO2 92%    BMI 27.84 kg/m    Physical Exam Vitals reviewed.  Constitutional:      General: She is not in acute distress.    Appearance: Normal appearance. She is not ill-appearing, toxic-appearing or diaphoretic.  HENT:     Head: Normocephalic and atraumatic.     Right Ear: Ear canal and external ear normal. A middle ear effusion is present. There is no impacted cerumen.     Left Ear: Tympanic membrane, ear canal and external ear normal. There is no impacted cerumen.  Eyes:     General: No  scleral icterus.       Right eye: No discharge.        Left eye: No discharge.     Conjunctiva/sclera: Conjunctivae normal.  Cardiovascular:     Rate and Rhythm: Normal rate.  Pulmonary:     Effort: Pulmonary effort is normal. No respiratory distress.  Musculoskeletal:        General: Normal range of motion.     Cervical back: Normal range of motion.  Skin:    General: Skin is warm and dry.     Capillary Refill: Capillary refill takes less than 2 seconds.  Neurological:     General: No focal deficit present.     Mental Status: She is alert and oriented to person, place, and time. Mental status is at baseline.  Psychiatric:        Mood and Affect: Mood normal.        Behavior: Behavior normal.         Thought Content: Thought content normal.        Judgment: Judgment normal.

## 2021-09-26 ENCOUNTER — Telehealth: Payer: Self-pay | Admitting: Family Medicine

## 2021-09-26 NOTE — Telephone Encounter (Signed)
She could consider OTC sudafed for a day or so to help decongest her.  This is available behind the pharmacy counter.  She will need to bring an ID to pharmacy in order to obtain.

## 2021-09-26 NOTE — Telephone Encounter (Signed)
Pt is aware of provider feedback and voiced understanding. 

## 2021-09-26 NOTE — Telephone Encounter (Signed)
Pt had televisit 09/08/2021 and came in on 09/20/2021. She still has fluid in RT ear and dizziness. Pt is asking for something stronger. USE [facility]

## 2021-09-26 NOTE — Telephone Encounter (Signed)
Is there anything else that can be sent in or does pt ntbs?

## 2021-09-27 NOTE — Telephone Encounter (Signed)
Spoke to patient on phone.  She is aware of recommendations.

## 2021-10-05 ENCOUNTER — Telehealth: Payer: Self-pay | Admitting: Family Medicine

## 2021-10-05 ENCOUNTER — Ambulatory Visit (INDEPENDENT_AMBULATORY_CARE_PROVIDER_SITE_OTHER): Payer: Medicare HMO | Admitting: Family Medicine

## 2021-10-05 ENCOUNTER — Encounter: Payer: Self-pay | Admitting: Family Medicine

## 2021-10-05 VITALS — BP 139/81 | HR 86 | Temp 97.5°F | Ht 64.0 in | Wt 160.8 lb

## 2021-10-05 DIAGNOSIS — H66003 Acute suppurative otitis media without spontaneous rupture of ear drum, bilateral: Secondary | ICD-10-CM | POA: Diagnosis not present

## 2021-10-05 DIAGNOSIS — R002 Palpitations: Secondary | ICD-10-CM

## 2021-10-05 MED ORDER — AMOXICILLIN-POT CLAVULANATE 875-125 MG PO TABS: 1.0000 | ORAL_TABLET | Freq: Two times a day (BID) | ORAL | 0 refills | Status: DC

## 2021-10-05 NOTE — Progress Notes (Signed)
Subjective:  Patient ID: Krista Henderson, female    DOB: [birth year]  Age: 71 y.o. MRN: [id number]  CC: Hypertension and Tachycardia   HPI Krista Henderson presents for recent Covid. Took 4 pills BID for 5 days.Then had fluid on ear with vertigo. Still dizzy since 12/29. Gets palpitations when she lays with her right ear down. [place] Went to [facility] there and had A.fib. Cards said it was one time only when she followed up - based on lack of hydration. She has a cardiology follow up set up for next week.   At 4 AM today awoke dizzy. BP 179/101. Has never had hypertension. Took meclizine and went back to bed. Sx gone when she awoke this morning.  Her ear still hurt  Depression screen [facility] 2/9 10/05/2021 09/06/2021 05/17/2021  Decreased Interest 0 0 0  Down, Depressed, Hopeless 0 0 0  PHQ - 2 Score 0 0 0  Altered sleeping - - 0  Tired, decreased energy - - 0  Change in appetite - - 0  Feeling bad or failure about yourself  - - 0  Trouble concentrating - - 0  Moving slowly or fidgety/restless - - 0  Suicidal thoughts - - 0  PHQ-9 Score - - 0  Difficult doing work/chores - - Not difficult at all    History Krista Henderson has a past medical history of Abnormal Pap smear of cervix, Blood transfusion without reported diagnosis, Bone spur (09/2017), Endometriosis, removal of cyst, Hyperlipidemia, Leukopenia (08/16/2012), Migraine without aura, and Vitamin D deficiency.   She has a past surgical history that includes Cesarean section; Appendectomy (1965); Total abdominal hysterectomy w/ bilateral salpingoophorectomy (08/1989); Diagnostic laparoscopy; Colonoscopy (10/13/2013); and Polypectomy.   Her family history includes Breast cancer in her paternal aunt; Cancer in her paternal aunt; Colon cancer (age of onset: 50) in her father; Colon polyps in her father; Diabetes in her mother; Berenice Primas' disease in her daughter; Hypertension in her mother; Osteoporosis in her mother;  Stomach cancer in her paternal grandfather.She reports that she has never smoked. She has never used smokeless tobacco. She reports that she does not drink alcohol and does not use drugs.    ROS Review of Systems  Constitutional: Negative.   HENT: Negative.    Eyes:  Negative for visual disturbance.  Respiratory:  Negative for shortness of breath.   Cardiovascular:  Negative for chest pain.  Gastrointestinal:  Positive for diarrhea. Negative for abdominal pain.  Musculoskeletal:  Negative for arthralgias.   Objective:  BP 139/81    Pulse 86    Temp (!) 97.5 F (36.4 C)    Ht 5\' 4"  (1.626 m)    Wt 160 lb 12.8 oz (72.9 kg)    LMP 08/18/1989    SpO2 95%    BMI 27.60 kg/m   BP Readings from Last 3 Encounters:  10/05/21 139/81  09/20/21 122/78  09/06/21 134/80    Wt Readings from Last 3 Encounters:  10/05/21 160 lb 12.8 oz (72.9 kg)  09/20/21 162 lb 3.2 oz (73.6 kg)  09/06/21 164 lb 3.2 oz (74.5 kg)     Physical Exam Constitutional:      General: She is not in acute distress.    Appearance: She is well-developed.  HENT:     Ears:     Comments:  right TM has glue ear appearance.  The left has erythema. Cardiovascular:     Rate and Rhythm: Normal rate and regular rhythm.  Pulmonary:     Breath sounds: Normal breath sounds.  Musculoskeletal:        General: Normal range of motion.  Skin:    General: Skin is warm and dry.  Neurological:     Mental Status: She is alert and oriented to person, place, and time.    EKG reviewed.  Normal sinus rhythm.  Normal axes.  No ischemic changes.  No tachycardia.  Assessment & Plan:       I am having Krista Henderson start on amoxicillin-clavulanate. I am also having her maintain her Probiotic Product (PROBIOTIC DAILY PO), Vitamin D (Ergocalciferol), fluticasone, cetirizine, and meclizine.  Allergies as of 10/05/2021       Reactions   Codeine    REACTION: rash/nausea/vomiting   Influenza Vaccines    "injections site turned  black color and had a hen size egg lump at site". Per pt she said not sure if the reactions was from a spider bite or flu shot.     Sulfa Antibiotics Nausea And Vomiting        Medication List        Accurate as of October 05, 2021  6:34 PM. If you have any questions, ask your nurse or doctor.          amoxicillin-clavulanate 875-125 MG tablet Commonly known as: AUGMENTIN Take 1 tablet by mouth 2 (two) times daily. Take all of this medication Started by: Claretta Fraise, MD   cetirizine 10 MG tablet Commonly known as: ZYRTEC Take 1 tablet (10 mg total) by mouth daily.   fluticasone 50 MCG/ACT nasal spray Commonly known as: FLONASE Place 2 sprays into both nostrils daily.   meclizine 25 MG tablet Commonly known as: ANTIVERT Take 1 tablet (25 mg total) by mouth 3 (three) times daily as needed for dizziness.   PROBIOTIC DAILY PO Take by mouth.   Vitamin D (Ergocalciferol) 1.25 MG (50000 UNIT) Caps capsule Commonly known as: DRISDOL TAKE 1 CAPSULE BY MOUTH ONE TIME PER WEEK       Her palpitations appear to be harmless at this point.  Should they become recurrent she should follow-up.  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  Follow-up: No follow-ups on file.  Claretta Fraise, M.D.

## 2021-10-05 NOTE — Telephone Encounter (Signed)
Is its ok to take meclizine (ANTIVERT) 25 MG tablet and cetirizine (ZYRTEC) 10 MG tablet with amoxicillin-clavulanate (AUGMENTIN) 875-125 MG tablet. Please call back

## 2021-10-05 NOTE — Telephone Encounter (Signed)
Pt aware.

## 2021-10-05 NOTE — Telephone Encounter (Signed)
Lm

## 2021-10-07 NOTE — Telephone Encounter (Signed)
Appt made

## 2021-10-13 ENCOUNTER — Ambulatory Visit: Payer: Medicare HMO | Admitting: Cardiology

## 2021-10-13 ENCOUNTER — Other Ambulatory Visit: Payer: Self-pay

## 2021-10-13 ENCOUNTER — Encounter: Payer: Self-pay | Admitting: Cardiology

## 2021-10-13 VITALS — BP 130/92 | HR 83 | Ht 64.0 in | Wt 167.2 lb

## 2021-10-13 DIAGNOSIS — I480 Paroxysmal atrial fibrillation: Secondary | ICD-10-CM

## 2021-10-13 NOTE — Progress Notes (Signed)
Electrophysiology Office Note   Date:  10/13/2021   ID:  Krista Henderson, DOB [birth year], MRN [id number]  PCP:  [name], DO  Cardiologist:   Primary Electrophysiologist:  Laine Fonner [name], MD    Chief Complaint: AF   History of Present Illness: Krista Henderson is a 71 y.o. female who is being seen today for the evaluation of AF at the request of [name], Ashly M, DO. Presenting today for electrophysiology evaluation.  She presented emergency room in [place] with dizziness, vomiting, palpitations.  She was found to be in rapid atrial fibrillation.  She was started on diltiazem and Eliquis.  She converted to sinus rhythm spontaneously.  Today, denies symptoms of palpitations, chest pain, shortness of breath, orthopnea, PND, lower extremity edema, claudication, dizziness, presyncope, syncope, bleeding, or neurologic sequela. The patient is tolerating medications without difficulties.  She is currently feeling well.  She did have COVID in December and was put on Paxlovid she then developed an ear infection and had significant dizziness.  She is currently on antibiotics.  Today, denies symptoms of palpitations, chest pain, shortness of breath, orthopnea, PND, lower extremity edema, claudication, dizziness, presyncope, syncope, bleeding, or neurologic sequela. The patient is tolerating medications without difficulties.  She has noted no further episodes of atrial fibrillation.   Past Medical History:  Diagnosis Date   Abnormal Pap smear of cervix    Blood transfusion without reported diagnosis    Bone spur 09/2017   Left hip   Endometriosis    Hx of removal of cyst    right ear   Hyperlipidemia    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   WBC 3,000 08/30/11 52 P 43 L   Migraine without aura    Vitamin D deficiency    Past Surgical History:  Procedure Laterality Date   APPENDECTOMY  1965   7th grade in the '60's   CESAREAN SECTION     x2 '84 & '85   COLONOSCOPY   10/13/2013   DIAGNOSTIC LAPAROSCOPY     POLYPECTOMY     TOTAL ABDOMINAL HYSTERECTOMY W/ BILATERAL SALPINGOOPHORECTOMY  08/1989   endometriosis     Current Outpatient Medications  Medication Sig Dispense Refill   amoxicillin-clavulanate (AUGMENTIN) 875-125 MG tablet Take 1 tablet by mouth 2 (two) times daily. Take all of this medication 20 tablet 0   Calcium Citrate (CITRACAL PO) Take 1 capsule by mouth daily.     Probiotic Product (PROBIOTIC DAILY PO) Take by mouth.     Vitamin D, Ergocalciferol, (DRISDOL) 1.25 MG (50000 UNIT) CAPS capsule TAKE 1 CAPSULE BY MOUTH ONE TIME PER WEEK 12 capsule 3   cetirizine (ZYRTEC) 10 MG tablet Take 1 tablet (10 mg total) by mouth daily. (Patient not taking: Reported on 10/13/2021) 30 tablet 0   fluticasone (FLONASE) 50 MCG/ACT nasal spray Place 2 sprays into both nostrils daily. (Patient not taking: Reported on 10/13/2021) 48 g 1   meclizine (ANTIVERT) 25 MG tablet Take 1 tablet (25 mg total) by mouth 3 (three) times daily as needed for dizziness. (Patient not taking: Reported on 10/13/2021) 30 tablet 0   No current facility-administered medications for this visit.    Allergies:   Codeine, Influenza vaccines, and Sulfa antibiotics   Social History:  The patient  reports that she has never smoked. She has never used smokeless tobacco. She reports that she does not drink alcohol and does not use drugs.   Family History:  The patient's family history includes Breast cancer in her  paternal aunt; Cancer in her paternal aunt; Colon cancer (age of onset: 29) in her father; Colon polyps in her father; Diabetes in her mother; Berenice Primas' disease in her daughter; Hypertension in her mother; Osteoporosis in her mother; Stomach cancer in her paternal grandfather.   ROS:  Please see the history of present illness.   Otherwise, review of systems is positive for none.   All other systems are reviewed and negative.   PHYSICAL EXAM: VS:  BP (!) 130/92    Pulse 83    Ht 5\' 4"   (1.626 m)    Wt 167 lb 3.2 oz (75.8 kg)    LMP 08/18/1989    SpO2 97%    BMI 28.70 kg/m  , BMI Body mass index is 28.7 kg/m. GEN: Well nourished, well developed, in no acute distress  HEENT: normal  Neck: no JVD, carotid bruits, or masses Cardiac: RRR; no murmurs, rubs, or gallops,no edema  Respiratory:  clear to auscultation bilaterally, normal work of breathing GI: soft, nontender, nondistended, + BS MS: no deformity or atrophy  Skin: warm and dry Neuro:  Strength and sensation are intact Psych: euthymic mood, full affect  EKG:  EKG is ordered today. Personal review of the ekg ordered shows sinus rhythm, rate 83, PVC  Recent Labs: 05/11/2021: ALT 12; BUN 12; Creatinine, Ser 0.85; Hemoglobin 13.2; Platelets 176; Potassium 4.5; Sodium 140; TSH 3.130    Lipid Panel     Component Value Date/Time   CHOL 234 (H) 05/11/2021 0935   TRIG 132 05/11/2021 0935   TRIG 190 (H) 08/20/2013 0835   HDL 55 05/11/2021 0935   HDL 57 08/20/2013 0835   CHOLHDL 4.3 05/11/2021 0935   LDLCALC 155 (H) 05/11/2021 0935   LDLCALC 115 (H) 08/20/2013 0835     Wt Readings from Last 3 Encounters:  10/13/21 167 lb 3.2 oz (75.8 kg)  10/05/21 160 lb 12.8 oz (72.9 kg)  09/20/21 162 lb 3.2 oz (73.6 kg)      Other studies Reviewed: Additional studies/ records that were reviewed today include: TTE 02/02/2021 Review of the above records today demonstrates:  LV cavity size is normal Mild concentric LVH Ejection fraction 60 to 65% Right ventricle mildly dilated Normal pulmonary artery pressure   ASSESSMENT AND PLAN:  1.  Paroxysmal atrial fibrillation: Currently on  diltiazem.  CHA2DS2-VASc of 1.  She has had no further episodes of atrial fibrillation.  She is currently feeling well and is without complaint.  She is overall happy with her control.  As she has not had any further episodes other than the one that she had years ago, we Alexie Samson see her back as needed.  She Melinna Linarez call us back if she goes back into  atrial fibrillation.   Current medicines are reviewed at length with the patient today.   The patient does not have concerns regarding her medicines.  The following changes were made today: None  Labs/ tests ordered today include:  Orders Placed This Encounter  Procedures   EKG 12-Lead     Disposition:   FU with Jerriyah Louis as needed months  Signed, Casi Westerfeld [name], MD  10/13/2021 9:53 AM     [address] [place] [address] [phone] (office) [phone] (fax)

## 2021-10-31 ENCOUNTER — Telehealth: Payer: Self-pay | Admitting: Family Medicine

## 2021-10-31 ENCOUNTER — Other Ambulatory Visit: Payer: Self-pay | Admitting: Family Medicine

## 2021-10-31 DIAGNOSIS — E7800 Pure hypercholesterolemia, unspecified: Secondary | ICD-10-CM

## 2021-10-31 DIAGNOSIS — R739 Hyperglycemia, unspecified: Secondary | ICD-10-CM

## 2021-10-31 NOTE — Telephone Encounter (Signed)
Orders are written.  I see no barriers to obtaining DEXA scan but will need to make sure that our rad tech is available to perform

## 2021-10-31 NOTE — Telephone Encounter (Signed)
Patient has appointment 03/08 and would like to come a day or two ahead of time for labs. Can we place lab orders. She would also like to complete Dexa Scan that day as well.

## 2021-11-02 ENCOUNTER — Other Ambulatory Visit: Payer: Medicare HMO

## 2021-11-07 NOTE — Telephone Encounter (Signed)
Spoke to [employer]. She states no unforeseeable barriers.  Contacted patient and gave the green light to come in a get labs and Dexa scan.

## 2021-11-08 ENCOUNTER — Emergency Department (HOSPITAL_COMMUNITY): Payer: Medicare HMO

## 2021-11-08 ENCOUNTER — Emergency Department (HOSPITAL_COMMUNITY)
Admission: EM | Admit: 2021-11-08 | Discharge: 2021-11-08 | Disposition: A | Discharge status: Home / Self Care | Payer: Medicare HMO | Attending: Emergency Medicine | Admitting: Emergency Medicine

## 2021-11-08 ENCOUNTER — Other Ambulatory Visit: Payer: Self-pay

## 2021-11-08 ENCOUNTER — Encounter (HOSPITAL_COMMUNITY): Payer: Self-pay | Admitting: Emergency Medicine

## 2021-11-08 DIAGNOSIS — R0789 Other chest pain: Secondary | ICD-10-CM | POA: Diagnosis not present

## 2021-11-08 DIAGNOSIS — R079 Chest pain, unspecified: Secondary | ICD-10-CM | POA: Diagnosis not present

## 2021-11-08 DIAGNOSIS — Z7982 Long term (current) use of aspirin: Secondary | ICD-10-CM | POA: Insufficient documentation

## 2021-11-08 DIAGNOSIS — Z90710 Acquired absence of both cervix and uterus: Secondary | ICD-10-CM | POA: Diagnosis not present

## 2021-11-08 DIAGNOSIS — E785 Hyperlipidemia, unspecified: Secondary | ICD-10-CM | POA: Insufficient documentation

## 2021-11-08 DIAGNOSIS — Z20822 Contact with and (suspected) exposure to covid-19: Secondary | ICD-10-CM | POA: Insufficient documentation

## 2021-11-08 DIAGNOSIS — J984 Other disorders of lung: Secondary | ICD-10-CM | POA: Diagnosis not present

## 2021-11-08 DIAGNOSIS — K209 Esophagitis, unspecified without bleeding: Secondary | ICD-10-CM | POA: Diagnosis not present

## 2021-11-08 DIAGNOSIS — M4699 Unspecified inflammatory spondylopathy, multiple sites in spine: Secondary | ICD-10-CM | POA: Diagnosis not present

## 2021-11-08 DIAGNOSIS — K449 Diaphragmatic hernia without obstruction or gangrene: Secondary | ICD-10-CM | POA: Diagnosis not present

## 2021-11-08 DIAGNOSIS — I700 Atherosclerosis of aorta: Secondary | ICD-10-CM | POA: Diagnosis not present

## 2021-11-08 LAB — CBC WITH DIFFERENTIAL/PLATELET
Abs Immature Granulocytes: 0.01 10*3/uL (ref 0.00–0.07)
Basophils Absolute: 0 10*3/uL (ref 0.0–0.1)
Basophils Relative: 1 %
Eosinophils Absolute: 0.1 10*3/uL (ref 0.0–0.5)
Eosinophils Relative: 1 %
HCT: 40.1 % (ref 36.0–46.0)
Hemoglobin: 13.9 g/dL (ref 12.0–15.0)
Immature Granulocytes: 0 %
Lymphocytes Relative: 24 %
Lymphs Abs: 1.1 10*3/uL (ref 0.7–4.0)
MCH: 33.1 pg (ref 26.0–34.0)
MCHC: 34.7 g/dL (ref 30.0–36.0)
MCV: 95.5 fL (ref 80.0–100.0)
Monocytes Absolute: 0.3 10*3/uL (ref 0.1–1.0)
Monocytes Relative: 6 %
Neutro Abs: 3.1 10*3/uL (ref 1.7–7.7)
Neutrophils Relative %: 68 %
Platelets: 200 10*3/uL (ref 150–400)
RBC: 4.2 MIL/uL (ref 3.87–5.11)
RDW: 12 % (ref 11.5–15.5)
WBC: 4.6 10*3/uL (ref 4.0–10.5)
nRBC: 0 % (ref 0.0–0.2)

## 2021-11-08 LAB — URINALYSIS, ROUTINE W REFLEX MICROSCOPIC
Bilirubin Urine: NEGATIVE
Glucose, UA: NEGATIVE mg/dL
Hgb urine dipstick: NEGATIVE
Ketones, ur: NEGATIVE mg/dL
Leukocytes,Ua: NEGATIVE
Nitrite: NEGATIVE
Protein, ur: NEGATIVE mg/dL
Specific Gravity, Urine: 1.003 — ABNORMAL LOW (ref 1.005–1.030)
pH: 6 (ref 5.0–8.0)

## 2021-11-08 LAB — COMPREHENSIVE METABOLIC PANEL
ALT: 16 U/L (ref 0–44)
AST: 21 U/L (ref 15–41)
Albumin: 4.4 g/dL (ref 3.5–5.0)
Alkaline Phosphatase: 74 U/L (ref 38–126)
Anion gap: 11 (ref 5–15)
BUN: 13 mg/dL (ref 8–23)
CO2: 25 mmol/L (ref 22–32)
Calcium: 9.4 mg/dL (ref 8.9–10.3)
Chloride: 99 mmol/L (ref 98–111)
Creatinine, Ser: 0.87 mg/dL (ref 0.44–1.00)
GFR, Estimated: >60 mL/min (ref >60)
Glucose, Bld: 180 mg/dL — ABNORMAL HIGH (ref 70–99)
Potassium: 3.5 mmol/L (ref 3.5–5.1)
Sodium: 135 mmol/L (ref 135–145)
Total Bilirubin: 1.1 mg/dL (ref 0.3–1.2)
Total Protein: 7.4 g/dL (ref 6.5–8.1)

## 2021-11-08 LAB — RESP PANEL BY RT-PCR (FLU A&B, COVID) ARPGX2
Influenza A by PCR: NEGATIVE
Influenza B by PCR: NEGATIVE
SARS Coronavirus 2 by RT PCR: NEGATIVE

## 2021-11-08 LAB — TROPONIN I (HIGH SENSITIVITY)
Troponin I (High Sensitivity): 4 ng/L (ref <18)
Troponin I (High Sensitivity): 4 ng/L (ref <18)

## 2021-11-08 LAB — TSH: TSH: 3.142 u[IU]/mL (ref 0.350–4.500)

## 2021-11-08 LAB — LIPASE, BLOOD: Lipase: 42 U/L (ref 11–51)

## 2021-11-08 MED ORDER — ASPIRIN 81 MG PO CHEW
243.0000 mg | CHEWABLE_TABLET | Freq: Once | ORAL | Status: AC
  Administered 2021-11-08: 243 mg via ORAL
  Filled 2021-11-08: qty 3

## 2021-11-08 MED ORDER — ONDANSETRON HCL 4 MG/2ML IJ SOLN
4.0000 mg | Freq: Once | INTRAMUSCULAR | Status: AC
  Administered 2021-11-08: 4 mg via INTRAVENOUS
  Filled 2021-11-08: qty 2

## 2021-11-08 MED ORDER — NITROGLYCERIN 0.4 MG SL SUBL
0.4000 mg | SUBLINGUAL_TABLET | SUBLINGUAL | Status: DC | PRN
  Filled 2021-11-08: qty 1

## 2021-11-08 MED ORDER — IOHEXOL 350 MG/ML SOLN
70.0000 mL | Freq: Once | INTRAVENOUS | Status: AC | PRN
  Administered 2021-11-08: 70 mL via INTRAVENOUS

## 2021-11-08 MED ORDER — OMEPRAZOLE 20 MG PO CPDR: 20.0000 mg | DELAYED_RELEASE_CAPSULE | Freq: Every day | ORAL | 0 refills | Status: DC

## 2021-11-08 NOTE — ED Triage Notes (Signed)
Pt. Stated, Im having some pain in the back of my neck 7/10

## 2021-11-08 NOTE — Discharge Instructions (Addendum)
Your blood work to your heart today looks normal.  Everything with your thyroid and kidneys are good.  Your CAT scan did not show any signs of any issues with the main blood vessel but did show that you might have some inflammation in your esophagus.  The cardiologist should call you for a follow-up appointment for stress testing may be wearing a monitor.  In the meantime we will start an antacid to see if that helps with your symptoms at night.  If you start having recurrent severe pain, inability to catch her breath return to the emergency room.

## 2021-11-08 NOTE — ED Provider Notes (Signed)
[facility] EMERGENCY DEPARTMENT Provider Note   CSN: [id number] Arrival date & time: 11/08/21  2951     History  Chief Complaint  Patient presents with   Chest Pain   Neck Pain   Shoulder Pain    Krista Henderson is a 71 y.o. female.  Patient is a 71 year old female with a history of paroxysmal atrial fibrillation that resolved last year and she has not had any further episodes hyperlipidemia currently on no medications presenting today with complaint of chest and neck pain.  Patient reports for the last 2 nights before last night when she would roll over to her left side she would notice that she had palpitations and she felt uncomfortable.  Throughout the day she seemed okay then last night around 2:30 AM she woke up with significant discomfort throughout the center of her chest that went up into her right neck and the back of her neck.  She reported the pain intermittently ran down her right arm and both arms intermittently felt numb and tingly.  She had nausea and loose stool associated with this and mild shortness of breath.  She has had an occasional dry cough but denies any fever or productive cough.  She checked her blood pressure this morning when all this was happening and it was 200s reports she does not have a history of high blood pressure.  She took an 81 mg aspirin early this morning and reports she had a kind of set up the rest of the evening but her symptoms are improved.  She still has a mild dull aching in her chest and also still feels a pain in the back of her neck and in her arm.  She is still having some mild nausea but also complains of some abdominal discomfort.  She is no longer feeling palpitations.  The history is provided by the patient, the spouse and medical records.  Chest Pain Neck Pain Associated symptoms: chest pain   Shoulder Pain Associated symptoms: neck pain       Home Medications Prior to Admission medications   Medication Sig  Start Date End Date Taking? Authorizing Provider  aspirin EC 81 MG tablet Take 81 mg by mouth once. Swallow whole.   Yes [provider]  Black Elderberry 50 MG/5ML SYRP Take 15 mLs by mouth daily.   Yes [provider]  Calcium Citrate (CITRACAL PO) Take 1 capsule by mouth daily.   Yes [provider]  meclizine (ANTIVERT) 25 MG tablet Take 1 tablet (25 mg total) by mouth 3 (three) times daily as needed for dizziness. 09/20/21  Yes Loman Brooklyn, FNP  omeprazole (PRILOSEC) 20 MG capsule Take 1 capsule (20 mg total) by mouth daily. 11/08/21  Yes Blanchie Dessert, MD  Probiotic Product (PROBIOTIC DAILY PO) Take 1 capsule by mouth daily. Flogen   Yes [provider]  Vitamin D, Ergocalciferol, (DRISDOL) 1.25 MG (50000 UNIT) CAPS capsule TAKE 1 CAPSULE BY MOUTH ONE TIME PER WEEK Patient taking differently: Take 50,000 Units by mouth every 7 (seven) days. Wednesday 02/22/21  Yes Ronnie Doss M, DO  cetirizine (ZYRTEC) 10 MG tablet Take 1 tablet (10 mg total) by mouth daily. Patient not taking: Reported on 10/13/2021 09/20/21   Loman Brooklyn, FNP  fluticasone [facility]) 50 MCG/ACT nasal spray Place 2 sprays into both nostrils daily. Patient not taking: Reported on 10/13/2021 09/20/21   Loman Brooklyn, FNP      Allergies    Codeine,  Influenza vaccines, and Sulfa antibiotics    Review of Systems   Review of Systems  Cardiovascular:  Positive for chest pain.  Musculoskeletal:  Positive for neck pain.   Physical Exam Updated Vital Signs BP 125/70    Pulse 64    Resp 18    LMP 08/18/1989    SpO2 96%  Physical Exam Vitals and nursing note reviewed.  Constitutional:      General: She is not in acute distress.    Appearance: She is well-developed.  HENT:     Head: Normocephalic and atraumatic.  Eyes:     Pupils: Pupils are equal, round, and reactive to light.  Cardiovascular:     Rate and Rhythm: Normal rate and regular rhythm. Occasional Extrasystoles are  present.    Pulses: Normal pulses.     Heart sounds: Normal heart sounds. No murmur heard.   No friction rub.  Pulmonary:     Effort: Pulmonary effort is normal.     Breath sounds: Normal breath sounds. No wheezing or rales.  Chest:     Chest wall: No tenderness.  Abdominal:     General: Bowel sounds are normal. There is no distension.     Palpations: Abdomen is soft.     Tenderness: There is abdominal tenderness. There is no guarding or rebound.     Comments: Mild diffuse abdominal tenderness.  Most tenderness in the left lower quadrant  Musculoskeletal:        General: No tenderness. Normal range of motion.     Cervical back: Normal range of motion and neck supple. No tenderness.     Right lower leg: No edema.     Left lower leg: No edema.     Comments: No edema  Skin:    General: Skin is warm and dry.     Findings: No rash.  Neurological:     Mental Status: She is alert and oriented to person, place, and time. Mental status is at baseline.     Cranial Nerves: No cranial nerve deficit.  Psychiatric:        Mood and Affect: Mood normal.        Behavior: Behavior normal.    ED Results / Procedures / Treatments   Labs (all labs ordered are listed, but only abnormal results are displayed) Labs Reviewed  URINALYSIS, ROUTINE W REFLEX MICROSCOPIC - Abnormal; Notable for the following components:      Result Value   Color, Urine COLORLESS (*)    Specific Gravity, Urine 1.003 (*)    All other components within normal limits  COMPREHENSIVE METABOLIC PANEL - Abnormal; Notable for the following components:   Glucose, Bld 180 (*)    All other components within normal limits  RESP PANEL BY RT-PCR (FLU A&B, COVID) ARPGX2  CBC WITH DIFFERENTIAL/PLATELET  LIPASE, BLOOD  TSH  TROPONIN I (HIGH SENSITIVITY)  TROPONIN I (HIGH SENSITIVITY)    EKG EKG Interpretation  Date/Time:  Tuesday November 08 2021 08:13:27 EST Ventricular Rate:  86 PR Interval:  53 QRS Duration: 81 QT  Interval:  422 QTC Calculation: 505 R Axis:   29 Text Interpretation: Sinus rhythm Short PR interval Minimal ST depression, lateral leads Prolonged QT interval No previous tracing Confirmed by Blanchie Dessert [phone]) on 11/08/2021 8:20:42 AM  Radiology DG Chest Port 1 View  Result Date: 11/08/2021 CLINICAL DATA:  Neck and chest pain EXAM: PORTABLE CHEST 1 VIEW COMPARISON:  Radiograph 05/17/2021 FINDINGS: Unchanged cardiomediastinal silhouette. There is no focal airspace consolidation.  There is no large pleural effusion. There is no visible pneumothorax. Slight dextroconvex thoracic curvature. No acute osseous abnormality. IMPRESSION: No evidence of acute cardiopulmonary disease. Electronically Signed   By: Maurine Simmering M.D.   On: 11/08/2021 08:48   CT Angio Chest/Abd/Pel for Dissection W and/or Wo Contrast  Result Date: 11/08/2021 CLINICAL DATA:  Chest pain, back pain EXAM: CT ANGIOGRAPHY CHEST, ABDOMEN AND PELVIS TECHNIQUE: Non-contrast CT of the chest was initially obtained. Multidetector CT imaging through the chest, abdomen and pelvis was performed using the standard protocol during bolus administration of intravenous contrast. Multiplanar reconstructed images and MIPs were obtained and reviewed to evaluate the vascular anatomy. RADIATION DOSE REDUCTION: This exam was performed according to the departmental dose-optimization program which includes automated exposure control, adjustment of the mA and/or kV according to patient size and/or use of iterative reconstruction technique. CONTRAST:  69mL OMNIPAQUE IOHEXOL 350 MG/ML SOLN COMPARISON:  None. FINDINGS: CTA CHEST FINDINGS Cardiovascular: Preferential opacification of the thoracic aorta. No evidence of thoracic aortic aneurysm or dissection. Normal heart size. No pericardial effusion. Mediastinum/Nodes: No enlarged mediastinal, hilar, or axillary lymph nodes. Thyroid gland and trachea demonstrate no significant findings. Small hiatal hernia. Mild  esophageal wall thickening as can be seen with esophagitis. Lungs/Pleura: No focal consolidation. Mild right apical scarring. No pleural effusion or pneumothorax. Musculoskeletal: No chest wall abnormality. No acute or significant osseous findings. Dextrocurvature of the thoracic spine. Review of the MIP images confirms the above findings. CTA ABDOMEN AND PELVIS FINDINGS VASCULAR Aorta: Normal caliber aorta without aneurysm, dissection, vasculitis or significant stenosis. Abdominal aortic atherosclerosis. Celiac: Patent without evidence of aneurysm, dissection, vasculitis or significant stenosis. SMA: Patent without evidence of aneurysm, dissection, vasculitis or significant stenosis. Renals: Both renal arteries are patent without evidence of aneurysm, dissection, vasculitis, fibromuscular dysplasia or significant stenosis. IMA: Patent without evidence of aneurysm, dissection, vasculitis or significant stenosis. Inflow: Patent without evidence of aneurysm, dissection, vasculitis or significant stenosis. Veins: No obvious venous abnormality within the limitations of this arterial phase study. Review of the MIP images confirms the above findings. NON-VASCULAR Lower chest: No acute abnormality. Hepatobiliary: No focal liver abnormality is seen. No gallstones, gallbladder wall thickening, or biliary dilatation. Pancreas: Unremarkable. No pancreatic ductal dilatation or surrounding inflammatory changes. Spleen: Normal in size without focal abnormality. Adrenals/Urinary Tract: Adrenal glands are unremarkable. Kidneys are normal, without renal calculi, focal lesion, or hydronephrosis. Bladder is unremarkable. Stomach/Bowel: Stomach is within normal limits. No evidence of bowel wall thickening, distention, or inflammatory changes. Lymphatic: No enlarged abdominal or pelvic lymph nodes. Reproductive: Status post hysterectomy. No adnexal masses. Other: No abdominal wall hernia or abnormality. No abdominopelvic ascites.  Musculoskeletal: No acute osseous abnormality. No aggressive osseous lesion. Bilateral facet arthropathy at L4-5 and L5-S1. Review of the MIP images confirms the above findings. IMPRESSION: 1. No evidence of aortic aneurysm or dissection. 2. Aortic Atherosclerosis (ICD10-I70.0). 3. Mild esophageal wall thickening as can be seen with esophagitis. Electronically Signed   By: Kathreen Devoid M.D.   On: 11/08/2021 10:45    Procedures Procedures    Medications Ordered in ED Medications  nitroGLYCERIN (NITROSTAT) SL tablet 0.4 mg (has no administration in time range)  ondansetron (ZOFRAN) injection 4 mg (4 mg Intravenous Given 11/08/21 0831)  aspirin chewable tablet 243 mg (243 mg Oral Given 11/08/21 0830)  iohexol (OMNIPAQUE) 350 MG/ML injection 70 mL (70 mLs Intravenous Contrast Given 11/08/21 1036)    ED Course/ Medical Decision Making/ A&P  Medical Decision Making Amount and/or Complexity of Data Reviewed External Data Reviewed: notes. Labs: ordered. Decision-making details documented in ED Course. Radiology: ordered and independent interpretation performed. Decision-making details documented in ED Course. ECG/medicine tests: ordered and independent interpretation performed. Decision-making details documented in ED Course.  Risk OTC drugs. Prescription drug management.   Patient is a 71 year old female presenting today with symptoms of chest pain, palpitations and generally feeling unwell.  Also report of elevated blood pressure which is unusual for her.  Here she is still feeling uncomfortable and having some nausea.  She has mild diffuse abdominal tenderness as well but have symptoms in her neck and her arm.  EKG today shows a sinus rhythm with some ST changes laterally but looking at prior EKGs from the cardiologist do not look significantly different.  Patient is denying any specific infectious symptoms her husband is present in the room and denies having any  symptoms himself.  Concern for atypical ACS versus dissection.  Lower suspicion for PE.  Also possibility for abdominal pathology given she does have diffuse abdominal tenderness and some nausea.  She has no prior history of heart issues other than atrial fibrillation she had last year that spontaneously converted and she follows with cardiology yearly.  Based on external medical records she saw them in January and was doing well.  Patient did take an 81 mg aspirin will give a total of 324.  Given patient is still having discomfort and is hypertensive at 171/86 we will give nitroglycerin to see if that improves her pain.  Patient is on continuous cardiac monitoring and this time occasional PVCs but no other dysrhythmia identified at this time.  Labs are pending.  Patient may need a CTA of the chest abdomen pelvis if no acute coronary syndrome is identified.  Patient's symptoms started around 230 this morning and have been continuous.  Heart score today of 6.  12:42 PM I independently interpreted patient's EKG and lab.  Her EKG today based on prior EKGs from cardiology office are no different.  Troponin x2 is negative, UA without acute findings, COVID-negative, CMP within normal limits, lipase negative TSH within normal limits.  I independently viewed and interpreted patient's chest x-ray which is normal today.  On repeat evaluation patient is still having pain but minimally in her neck.  Blood pressure has improved without intervention.  She has only had a full-strength aspirin.  CTA done to rule out dissection or other acute pathology.  It was negative per radiology for aortic dissection or aneurysm.  She does have aortic atherosclerosis and mild esophageal wall thickening which could be seen with esophagitis.  Given patient's symptoms are most prominent when she lays down no evidence of dysrhythmia today we will start her on a PPI.  Spoke with cardiology who will call patient for close follow-up.  She will need  further evaluation given her heart score of 6 and her symptoms today.  However she is a well-appearing now with normal blood pressures.  Feel that she is stable for discharge and close follow-up.  Patient was given return precautions.  Findings were discussed with the patient and her husband they are comfortable with this plan.  Considered admission but at this time feel that she is stable for discharge.  No social determinants affecting discharge today        Final Clinical Impression(s) / ED Diagnoses Final diagnoses:  Nonspecific chest pain  Esophagitis    Rx / DC Orders ED Discharge Orders  Ordered    omeprazole (PRILOSEC) 20 MG capsule  Daily        11/08/21 1238              Blanchie Dessert, MD 11/08/21 1245

## 2021-11-08 NOTE — ED Triage Notes (Signed)
Pt. Stated, about 230 this morning and when I rolled over I would be sick on my stomach. Last night my chest was killing me and my stomach hurting. I was having chills and just sick. Laid back down and took my BP and it was high. I took a Baby ASA 81 mg. At 240 this morning. I couldn't lay down cause my chest was hurting.

## 2021-11-11 DIAGNOSIS — H2513 Age-related nuclear cataract, bilateral: Secondary | ICD-10-CM | POA: Diagnosis not present

## 2021-11-11 DIAGNOSIS — H5213 Myopia, bilateral: Secondary | ICD-10-CM | POA: Diagnosis not present

## 2021-11-16 ENCOUNTER — Ambulatory Visit: Payer: Medicare HMO | Admitting: Physician Assistant

## 2021-11-20 NOTE — Progress Notes (Signed)
? ?Cardiology Office Note ?Date:  11/23/2021  ?Patient ID:  Krista Henderson, Krista Henderson 08/22/1951, MRN [id number] ?PCP:  [name], DO  ?Electrophysiologist: Dr. Curt Bears ? ?  ?Chief Complaint: Post hospital ? ?History of Present Illness: ?Krista Henderson is a 71 y.o. female with history of HLD, Afib ? ?She comes in today to be seen for Dr. Curt Bears, last seen by him Jan 2023.  She had COVID in Dec, was treated with Paxlovid, the an ear infection associated with dizziness and still on abx.  She had not had symptoms of her Afib, on no a/c with low risk score.  Planned to see PRN for recurrent palpitations/AFib. ? ?She had an ER visit 11/08/20 for a constellation of symptoms for a couple night especially, with positional CP, palpitations, ? Hand numbness, some Nausea, loose stool associated with this, general sense of feeling unwell ?She was in SR and in d/w cardiology, without significant changes from prior ?Labs were unrevealing, CT without dissection, suspected perhaps GERD and started on PPI with cardiology follow up recommended. BP was initially elevated though settled without intervention ? ?K+ 3.5 ?BUN/Creat 13/0.87 ?LFTs ok ?HS Trop 4,4  ?WBC 4.6 ?H/H 13/40 ?Plts 200 ?TSH 3.142 ? ?CT chest ?IMPRESSION: ?1. No evidence of aortic aneurysm or dissection. ?2. Aortic Atherosclerosis (ICD10-I70.0). ?3. Mild esophageal wall thickening as can be seen with esophagitis. ? ?TODAY ?She has done well since the hospital ?[place] since her [place] in Dec and more specifically the Paxlovid treatment she thinks, that she has never been the same. ?Sine the hospital stay she has made a specific effort to get back to walking (had stopped after COVID) and is walking 3 miles a day with good exertional capacity, no CP, palpitations or SOB ?Feels well when walking ?She has also stopped her supplements and meclizine that she thinks were also perhaps making her feel poorly ?No dizzy spells, near syncope or syncope ? ?No changes to her  medical hx, risk score remains one for age (2 w/gender) ? ? ?AFib hx ?Diagnosed May 2022 (ER visit in [facility])  ?AAD Hx ?None to date ? ?Past Medical History:  ?Diagnosis Date  ? Abnormal Pap smear of cervix   ? Blood transfusion without reported diagnosis   ? Bone spur 09/2017  ? Left hip  ? Endometriosis   ? Hx of removal of cyst   ? right ear  ? Hyperlipidemia   ? 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  ? WBC 3,000 08/30/11 52 P 43 L  ? Migraine without aura   ? Vitamin D deficiency   ? ? ?Past Surgical History:  ?Procedure Laterality Date  ? APPENDECTOMY  1965  ? 7th grade in the '60's  ? CESAREAN SECTION    ? x2 '84 & '85  ? COLONOSCOPY  10/13/2013  ? DIAGNOSTIC LAPAROSCOPY    ? POLYPECTOMY    ? TOTAL ABDOMINAL HYSTERECTOMY W/ BILATERAL SALPINGOOPHORECTOMY  08/1989  ? endometriosis  ? ? ?Current Outpatient Medications  ?Medication Sig Dispense Refill  ? Calcium Citrate (CITRACAL PO) Take 1 capsule by mouth daily.    ? Probiotic Product (PROBIOTIC DAILY PO) Take 1 capsule by mouth daily. Flogen    ? Vitamin D, Ergocalciferol, (DRISDOL) 1.25 MG (50000 UNIT) CAPS capsule TAKE 1 CAPSULE BY MOUTH ONE TIME PER WEEK (Patient taking differently: Take 50,000 Units by mouth every 7 (seven) days. Wednesday) 12 capsule 3  ? ?No current facility-administered medications for this visit.  ? ? ?Allergies:   Codeine, Influenza vaccines, and  Sulfa antibiotics  ? ?Social History:  The patient  reports that she has never smoked. She has never used smokeless tobacco. She reports that she does not drink alcohol and does not use drugs.  ? ?Family History:  The patient's family history includes Breast cancer in her paternal aunt; Cancer in her paternal aunt; Colon cancer (age of onset: 51) in her father; Colon polyps in her father; Diabetes in her mother; Berenice Primas' disease in her daughter; Hypertension in her mother; Osteoporosis in her mother; Stomach cancer in her paternal grandfather. ? ?ROS:  Please see the history of present illness.    ?All other  systems are reviewed and otherwise negative.  ? ?PHYSICAL EXAM:  ?VS:  BP 134/84   Pulse 88   Ht 5' 4.5" (1.638 m)   Wt 165 lb 3.2 oz (74.9 kg)   LMP 08/18/1989   SpO2 97%   BMI 27.92 kg/m?  BMI: Body mass index is 27.92 kg/m?. ?Well nourished, well developed, in no acute distress ?HEENT: normocephalic, atraumatic ?Neck: no JVD, carotid bruits or masses ?Cardiac:   RRR; no significant murmurs, no rubs, or gallops ?Lungs:  CTA b/l, no wheezing, rhonchi or rales ?Abd: soft, nontender ?MS: no deformity or atrophy ?Ext: no edema ?Skin: warm and dry, no rash ?Neuro:  No gross deficits appreciated ?Psych: euthymic mood, full affect ? ? ?EKG:  not done today, ER EKG personally reviewed ?SR 86bpm, artifact and baseline motion, no acute or ischemic changes noted ? ? ?TTE 02/02/2021 ?LV cavity size is normal ?Mild concentric LVH ?Ejection fraction 60 to 65% ?Right ventricle mildly dilated ?Normal pulmonary artery pressure ? ?Recent Labs: ?11/08/2021: Hemoglobin 13.9; Platelets 200; TSH 3.142 ?11/21/2021: ALT 14; BUN 16; Creatinine, Ser 0.88; Potassium 4.2; Sodium 140  ?11/21/2021: Chol/HDL Ratio 3.7; Cholesterol, Total 224; HDL 60; LDL Chol Calc (NIH) 140; Triglycerides 138  ? ?Estimated Creatinine Clearance: 59.6 mL/min (by C-G formula based on SCr of 0.88 mg/dL).  ? ?Wt Readings from Last 3 Encounters:  ?11/23/21 165 lb 3.2 oz (74.9 kg)  ?10/13/21 167 lb 3.2 oz (75.8 kg)  ?10/05/21 160 lb 12.8 oz (72.9 kg)  ?  ? ?Other studies reviewed: ?Additional studies/records reviewed today include: summarized above ? ?ASSESSMENT AND PLAN: ? ?Paroxysmal Afib ?CHA2DS2Vasc is one for age ?no burden by symptoms ?She has not felt like she has had any AFib or symptoms like she had when she was found to have AF ?She has wearable HR data on her watch ? ?2. CP ?Not recurrent ?Neg HS Trop x2, EKG OK ?She feels like something has changed post COVD, post treatment for COVID ?She is back to walking with good exertional capacity  ?Given COVID will  update her echo, though suspect some deconditioning plays a role in her decreased sense of wellbeing ? ?Advised to f/u with her PMD on esophagitis, +/-GERD ? ? ? ? ? ?Disposition: F/u with [place] in 67mo sooner if needed ? ?Current medicines are reviewed at length with the patient today.  The patient did not have any concerns regarding medicines. ? ?Signed, ?RTommye Standard PA-C ?11/23/2021 8:40 AM    ? ?[facility] ?[address] ?[id number]?[phone] (office)  ?[phone](fax) ? ? ?

## 2021-11-21 ENCOUNTER — Other Ambulatory Visit: Payer: Medicare HMO

## 2021-11-21 DIAGNOSIS — E7800 Pure hypercholesterolemia, unspecified: Secondary | ICD-10-CM | POA: Diagnosis not present

## 2021-11-21 DIAGNOSIS — R739 Hyperglycemia, unspecified: Secondary | ICD-10-CM

## 2021-11-21 LAB — CMP14+EGFR
ALT: 14 IU/L (ref 0–32)
AST: 14 IU/L (ref 0–40)
Albumin/Globulin Ratio: 2.3 — ABNORMAL HIGH (ref 1.2–2.2)
Albumin: 4.4 g/dL (ref 3.8–4.8)
Alkaline Phosphatase: 79 IU/L (ref 44–121)
BUN/Creatinine Ratio: 18 (ref 12–28)
BUN: 16 mg/dL (ref 8–27)
Bilirubin Total: 0.8 mg/dL (ref 0.0–1.2)
CO2: 23 mmol/L (ref 20–29)
Calcium: 9.2 mg/dL (ref 8.7–10.3)
Chloride: 104 mmol/L (ref 96–106)
Creatinine, Ser: 0.88 mg/dL (ref 0.57–1.00)
Globulin, Total: 1.9 g/dL (ref 1.5–4.5)
Glucose: 124 mg/dL — ABNORMAL HIGH (ref 70–99)
Potassium: 4.2 mmol/L (ref 3.5–5.2)
Sodium: 140 mmol/L (ref 134–144)
Total Protein: 6.3 g/dL (ref 6.0–8.5)
eGFR: 71 mL/min/{1.73_m2} (ref >59)

## 2021-11-21 LAB — LIPID PANEL
Chol/HDL Ratio: 3.7 ratio (ref 0.0–4.4)
Cholesterol, Total: 224 mg/dL — ABNORMAL HIGH (ref 100–199)
HDL: 60 mg/dL (ref >39)
LDL Chol Calc (NIH): 140 mg/dL — ABNORMAL HIGH (ref 0–99)
Triglycerides: 138 mg/dL (ref 0–149)
VLDL Cholesterol Cal: 24 mg/dL (ref 5–40)

## 2021-11-21 LAB — BAYER DCA HB A1C WAIVED: HB A1C (BAYER DCA - WAIVED): 5.4 % (ref 4.8–5.6)

## 2021-11-23 ENCOUNTER — Ambulatory Visit (INDEPENDENT_AMBULATORY_CARE_PROVIDER_SITE_OTHER): Payer: Medicare HMO

## 2021-11-23 ENCOUNTER — Ambulatory Visit (INDEPENDENT_AMBULATORY_CARE_PROVIDER_SITE_OTHER): Payer: Medicare HMO | Admitting: Family Medicine

## 2021-11-23 ENCOUNTER — Other Ambulatory Visit: Payer: Self-pay

## 2021-11-23 ENCOUNTER — Ambulatory Visit: Payer: Medicare HMO | Admitting: Physician Assistant

## 2021-11-23 ENCOUNTER — Encounter: Payer: Self-pay | Admitting: Physician Assistant

## 2021-11-23 ENCOUNTER — Encounter: Payer: Self-pay | Admitting: Family Medicine

## 2021-11-23 VITALS — BP 134/84 | HR 88 | Ht 64.5 in | Wt 165.2 lb

## 2021-11-23 VITALS — BP 107/65 | HR 64 | Temp 98.2°F | Ht 65.0 in | Wt 166.2 lb

## 2021-11-23 DIAGNOSIS — Z780 Asymptomatic menopausal state: Secondary | ICD-10-CM | POA: Diagnosis not present

## 2021-11-23 DIAGNOSIS — I480 Paroxysmal atrial fibrillation: Secondary | ICD-10-CM

## 2021-11-23 DIAGNOSIS — Z23 Encounter for immunization: Secondary | ICD-10-CM

## 2021-11-23 DIAGNOSIS — R079 Chest pain, unspecified: Secondary | ICD-10-CM | POA: Diagnosis not present

## 2021-11-23 DIAGNOSIS — E7800 Pure hypercholesterolemia, unspecified: Secondary | ICD-10-CM | POA: Diagnosis not present

## 2021-11-23 NOTE — Progress Notes (Signed)
? ?Subjective: ?CC: Chronic follow-up ?PCP: [name], DO ?Krista Henderson is a 71 y.o. female presenting to clinic today for: ? ?1.  Hyperlipidemia ?Patient had labs done prior to arrival and her ASCVD risk score was noted to be elevated at 9.2%.  LDL was persistently elevated at 140 with ongoing normalization of triglycerides and good HDL level at 60.  She denies any ongoing chest pain, shortness of breath.  Though she did have some atypical chest pain after COVID-19.  She saw her heart doctor and got a good checkup recently.  They are going to pursue ultrasound of her heart to further evaluate given history of COVID-19 infection. ? ? ?ROS: Per HPI ? ?Allergies  ?Allergen Reactions  ? Codeine   ?  REACTION: rash/nausea/vomiting  ? Influenza Vaccines   ?  "injections site turned black color and had a hen size egg lump at site". Per pt she said not sure if the reactions was from a spider bite or flu shot.    ? Sulfa Antibiotics Nausea And Vomiting  ? ?Past Medical History:  ?Diagnosis Date  ? Abnormal Pap smear of cervix   ? Blood transfusion without reported diagnosis   ? Bone spur 09/2017  ? Left hip  ? Endometriosis   ? Hx of removal of cyst   ? right ear  ? Hyperlipidemia   ? 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  ? WBC 3,000 08/30/11 52 P 43 L  ? Migraine without aura   ? Vitamin D deficiency   ? ? ?Current Outpatient Medications:  ?  Calcium Citrate (CITRACAL PO), Take 1 capsule by mouth daily., Disp: , Rfl:  ?  Probiotic Product (PROBIOTIC DAILY PO), Take 1 capsule by mouth daily. Flogen, Disp: , Rfl:  ?  Vitamin D, Ergocalciferol, (DRISDOL) 1.25 MG (50000 UNIT) CAPS capsule, TAKE 1 CAPSULE BY MOUTH ONE TIME PER WEEK (Patient taking differently: Take 50,000 Units by mouth every 7 (seven) days. Wednesday), Disp: 12 capsule, Rfl: 3 ?Social History  ? ?Socioeconomic History  ? Marital status: Married  ?  Spouse name: Elta Guadeloupe  ? Number of children: Not on file  ? Years of education: Not on file  ? Highest education  level: Not on file  ?Occupational History  ? Not on file  ?Tobacco Use  ? Smoking status: Never  ? Smokeless tobacco: Never  ?Vaping Use  ? Vaping Use: Never used  ?Substance and Sexual Activity  ? Alcohol use: No  ? Drug use: No  ? Sexual activity: Yes  ?  Partners: Male  ?  Birth control/protection: Surgical  ?  Comment: Hysterectomy  ?Other Topics Concern  ? Not on file  ?Social History Narrative  ? Married to [employer].  Her daughter is expecting a child in the next couple of days.  They will have a total of 2 grandchildren, both girls.  ? ?Social Determinants of Health  ? ?Financial Resource Strain: Not on file  ?Food Insecurity: Not on file  ?Transportation Needs: Not on file  ?Physical Activity: Not on file  ?Stress: Not on file  ?Social Connections: Not on file  ?Intimate Partner Violence: Not on file  ? ?Family History  ?Problem Relation Age of Onset  ? Diabetes Mother   ? Osteoporosis Mother   ? Hypertension Mother   ? Colon cancer Father 29  ? Colon polyps Father   ? Stomach cancer Paternal Grandfather   ? Breast cancer Paternal Aunt   ?     17's  ?  Cancer Paternal Aunt   ?     breast  ? Graves' disease Daughter   ? Esophageal cancer Neg Hx   ? Rectal cancer Neg Hx   ? ? ?Objective: ?Office vital signs reviewed. ?BP 107/65   Pulse 64   Temp 98.2 ?F (36.8 ?C) (Temporal)   Ht _0  (1.651 m)   Wt 166 lb 3.2 oz (75.4 kg)   LMP 08/18/1989   SpO2 96%   BMI 27.66 kg/m?  ? ?Physical Examination:  ?General: Awake, alert, well nourished, No acute distress ?HEENT: Sclera white.  Moist mucous membranes ?Cardio: regular rate and rhythm, S1S2 heard, no murmurs appreciated ?Pulm: clear to auscultation bilaterally, no wheezes, rhonchi or rales; normal work of breathing on room air ?Extremities: warm, well perfused, No edema, cyanosis or clubbing; +2 pulses bilaterally ? ?The 10-year ASCVD risk score (Arnett DK, et al., 2019) is: 11.1% ?  Values used to calculate the score: ?    Age: 35 years ?    Sex: Female ?    Is  Non-Hispanic African American: No ?    Diabetic: No ?    Tobacco smoker: Yes ?    Systolic Blood Pressure: 325 mmHg ?    Is BP treated: No ?    HDL Cholesterol: 60 mg/dL ?    Total Cholesterol: 224 mg/dL ? ?Assessment/ Plan: ?71 y.o. female  ? ?Pure hypercholesterolemia - Plan: Lipid panel, CMP14+EGFR ? ?Post-menopausal - Plan: DG [facility] DEXA ? ?Need for vaccination - Plan: Varicella-zoster vaccine IM (Shingrix) ? ?ASCVD risk or is elevated.  It was recalculated today with today's blood pressure reading and actually went up to 11.1%.  I do think that it is worth her considering cholesterol medication.  She is very reluctant to start anything which I totally understand.  However, may need to consider pulse dosing Crestor 10-20 mg a couple of times a week versus pursuing alternative therapy like Zetia, Nexletol or Vascepa if statin is not well-tolerated.  She will come in in 3 months for repeat CMP and lipid as she did identify some modification she can make to her diet and exercise regimen.  If ASCVD risk score is above 7.5% at that time she is agreeable to proceeding with treatment ? ?DEXA scan updated today ? ?Shingles vaccination administered ? ?No orders of the defined types were placed in this encounter. ? ?No orders of the defined types were placed in this encounter. ? ? ? ?[name], DO ?[place] ?[phone] ? ? ?

## 2021-11-23 NOTE — Patient Instructions (Signed)
Medication Instructions:  ? ?Your physician recommends that you continue on your current medications as directed. Please refer to the Current Medication list given to you today. ? ? ?*If you need a refill on your cardiac medications before your next appointment, please call your pharmacy* ? ? ?Lab Work: [place] ? ? ?If you have labs (blood work) drawn today and your tests are completely normal, you will receive your results only by: ?MyChart Message (if you have MyChart) OR ?A paper copy in the mail ?If you have any lab test that is abnormal or we need to change your treatment, we will call you to review the results. ? ? ?Testing/Procedures: Your physician has requested that you have an echocardiogram. Echocardiography is a painless test that uses sound waves to create images of your heart. It provides your doctor with information about the size and shape of your heart and how well your heart?s chambers and valves are working. This procedure takes approximately one hour. There are no restrictions for this procedure. ? ? ? ?Follow-Up: ?At [facility], you and your health needs are our priority.  As part of our continuing mission to provide you with exceptional heart care, we have created designated Provider Care Teams.  These Care Teams include your primary Cardiologist (physician) and Advanced Practice Providers (APPs -  Physician Assistants and Nurse Practitioners) who all work together to provide you with the care you need, when you need it. ? ?We recommend signing up for the patient portal called "MyChart".  Sign up information is provided on this After Visit Summary.  MyChart is used to connect with patients for Virtual Visits (Telemedicine).  Patients are able to view lab/test results, encounter notes, upcoming appointments, etc.  Non-urgent messages can be sent to your provider as well.   ?To learn more about what you can do with MyChart, go to [email or web address].   ? ?Your next  appointment:   ?6 month(s) ? ?The format for your next appointment:   ?In Person ? ?Provider:   ?You may see Will [name], MD or one of the following Advanced Practice Providers on your designated Care Team:   ?Tommye Standard, PA-C ?Legrand Como "Oda Kilts, PA-C{ ?

## 2021-11-23 NOTE — Patient Instructions (Signed)
We talked about ASCVD risk score being a calculation of various risk factors and the likelihood you would have a stroke or heart attack.  The cutoff for cholesterol intervention/ meds is 7.5%. ?

## 2021-11-24 DIAGNOSIS — M85851 Other specified disorders of bone density and structure, right thigh: Secondary | ICD-10-CM | POA: Diagnosis not present

## 2021-11-28 ENCOUNTER — Telehealth: Payer: Self-pay | Admitting: Family Medicine

## 2021-11-28 NOTE — Progress Notes (Signed)
Patient calling about DEXA results. Please call back  ?

## 2021-11-28 NOTE — Telephone Encounter (Signed)
Patient calling about DEXA results. Please call back.  ?

## 2021-11-30 ENCOUNTER — Other Ambulatory Visit: Payer: Self-pay

## 2021-11-30 ENCOUNTER — Ambulatory Visit (HOSPITAL_COMMUNITY): Payer: Medicare HMO | Attending: Cardiovascular Disease

## 2021-11-30 DIAGNOSIS — R079 Chest pain, unspecified: Secondary | ICD-10-CM | POA: Diagnosis not present

## 2021-12-01 ENCOUNTER — Encounter: Payer: Self-pay | Admitting: Cardiology

## 2021-12-01 LAB — ECHOCARDIOGRAM COMPLETE
Area-P 1/2: 4.21 cm2
S' Lateral: 2.7 cm

## 2021-12-02 ENCOUNTER — Telehealth: Payer: Self-pay | Admitting: *Deleted

## 2021-12-02 NOTE — Telephone Encounter (Signed)
Spoke with patient about results and verbalized understanding. Patient is not currently having any issues just more questions. Patient had a question about the spot that was unable to be seen on on CT. Patient was told Provider would be contacted about that question and get a response back to her. ?

## 2021-12-02 NOTE — Telephone Encounter (Signed)
-----   Message from [place], [place] sent at 12/01/2021  2:36 PM EDT ----- ?Echo looks good, heart is strong.  Will keep 6 mo visit as planned, unless she has more symptoms, to come in sooner. ?

## 2021-12-13 ENCOUNTER — Telehealth: Payer: Self-pay | Admitting: Family Medicine

## 2021-12-13 ENCOUNTER — Other Ambulatory Visit: Payer: Self-pay | Admitting: Family Medicine

## 2021-12-13 DIAGNOSIS — E7800 Pure hypercholesterolemia, unspecified: Secondary | ICD-10-CM

## 2021-12-13 MED ORDER — EZETIMIBE 10 MG PO TABS
10.0000 mg | ORAL_TABLET | Freq: Every day | ORAL | 3 refills | Status: DC
Start: 2021-12-13 — End: 2022-04-18

## 2021-12-13 NOTE — Telephone Encounter (Signed)
Spoke to pt

## 2022-01-18 ENCOUNTER — Encounter: Payer: Self-pay | Admitting: Internal Medicine

## 2022-01-23 DIAGNOSIS — M1611 Unilateral primary osteoarthritis, right hip: Secondary | ICD-10-CM | POA: Diagnosis not present

## 2022-02-09 ENCOUNTER — Telehealth (HOSPITAL_BASED_OUTPATIENT_CLINIC_OR_DEPARTMENT_OTHER): Payer: Self-pay | Admitting: Obstetrics & Gynecology

## 2022-02-09 NOTE — Telephone Encounter (Signed)
Patient called to today and wanted to know the name of the cream that Dr.Miller told her about awhile ago .

## 2022-02-14 NOTE — Telephone Encounter (Signed)
Pt called stating that Dr. Sabra Heck had told her that she could use OTC hydrocortisone cream a while back, if needed. Pt states that she has an area in the crease of her leg/pelvis that is irritated. Advised that she could use the cream externally. Advised that for any vaginal irritation or thin tissue, Dr. Sabra Heck recommended Reveree or Vit E for atrophy. Pt verbalized understanding.

## 2022-02-20 DIAGNOSIS — M1611 Unilateral primary osteoarthritis, right hip: Secondary | ICD-10-CM | POA: Diagnosis not present

## 2022-03-10 DIAGNOSIS — M25552 Pain in left hip: Secondary | ICD-10-CM | POA: Diagnosis not present

## 2022-03-13 ENCOUNTER — Ambulatory Visit (INDEPENDENT_AMBULATORY_CARE_PROVIDER_SITE_OTHER): Payer: Medicare HMO | Admitting: *Deleted

## 2022-03-13 DIAGNOSIS — Z23 Encounter for immunization: Secondary | ICD-10-CM

## 2022-03-27 DIAGNOSIS — D225 Melanocytic nevi of trunk: Secondary | ICD-10-CM | POA: Diagnosis not present

## 2022-03-27 DIAGNOSIS — Z1283 Encounter for screening for malignant neoplasm of skin: Secondary | ICD-10-CM | POA: Diagnosis not present

## 2022-03-28 ENCOUNTER — Ambulatory Visit (AMBULATORY_SURGERY_CENTER): Payer: Self-pay | Admitting: *Deleted

## 2022-03-28 VITALS — Ht 64.5 in | Wt 160.0 lb

## 2022-03-28 DIAGNOSIS — Z86010 Personal history of colonic polyps: Secondary | ICD-10-CM

## 2022-03-28 MED ORDER — NA SULFATE-K SULFATE-MG SULF 17.5-3.13-1.6 GM/177ML PO SOLN: 1.0000 | Freq: Once | ORAL | 0 refills | Status: AC

## 2022-03-28 NOTE — Progress Notes (Signed)
No egg or soy allergy known to patient  No issues known to pt with past sedation with any surgeries or procedures Patient denies ever being told they had issues or difficulty with intubation  No FH of Malignant Hyperthermia Pt is not on diet pills Pt is not on  home 02  Pt is not on blood thinners  Pt denies issues with constipation  Hx of  A fib 2 yrs ago - took Eliquis x 1 month then dc'd  Have any cardiac testing pending--pt denies  Pt instructed to use [email or web address] or GoodRx for a price reduction on prep

## 2022-03-30 ENCOUNTER — Encounter: Payer: Medicare HMO | Admitting: Internal Medicine

## 2022-04-13 ENCOUNTER — Encounter: Payer: Self-pay | Admitting: Internal Medicine

## 2022-04-18 ENCOUNTER — Encounter: Payer: Self-pay | Admitting: Internal Medicine

## 2022-04-18 ENCOUNTER — Ambulatory Visit (AMBULATORY_SURGERY_CENTER): Payer: Medicare HMO | Admitting: Internal Medicine

## 2022-04-18 VITALS — BP 122/68 | HR 60 | Temp 98.4°F | Resp 13 | Ht 65.0 in | Wt 166.0 lb

## 2022-04-18 DIAGNOSIS — Z09 Encounter for follow-up examination after completed treatment for conditions other than malignant neoplasm: Secondary | ICD-10-CM | POA: Diagnosis not present

## 2022-04-18 DIAGNOSIS — Z800 Family history of malignant neoplasm of digestive organs: Secondary | ICD-10-CM | POA: Diagnosis not present

## 2022-04-18 DIAGNOSIS — Z86010 Personal history of colonic polyps: Secondary | ICD-10-CM | POA: Diagnosis not present

## 2022-04-18 DIAGNOSIS — D123 Benign neoplasm of transverse colon: Secondary | ICD-10-CM

## 2022-04-18 DIAGNOSIS — D124 Benign neoplasm of descending colon: Secondary | ICD-10-CM

## 2022-04-18 MED ORDER — SODIUM CHLORIDE 0.9 % IV SOLN: 500.0000 mL | Freq: Once | INTRAVENOUS | Status: DC

## 2022-04-18 NOTE — Progress Notes (Signed)
GASTROENTEROLOGY PROCEDURE H&P NOTE   Primary Care Physician: [name], DO    Reason for Procedure:  History of adenomatous colon polyps and family history of colon cancer patient's father  Plan:    Colonoscopy  Patient is appropriate for endoscopic procedure(s) in the ambulatory ([facility]) setting.  The nature of the procedure, as well as the risks, benefits, and alternatives were carefully and thoroughly reviewed with the patient. Ample time for discussion and questions allowed. The patient understood, was satisfied, and agreed to proceed.     HPI: Krista Henderson is a 71 y.o. female who presents for surveillance colonoscopy.  Medical history as below.  Tolerated the prep.  No recent chest pain or shortness of breath.  No abdominal pain today.  Past Medical History:  Diagnosis Date   Abnormal Pap smear of cervix    Arthritis    toe , finger, both hips   Atrial fibrillation ([place])    [place]   Blood transfusion without reported diagnosis    Bone spur 09/2017   Left hip   Endometriosis    Hx of adenomatous colonic polyps    Hx of removal of cyst    right ear   Hyperlipidemia    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   WBC 3,000 08/30/11 52 P 43 L   Migraine without aura    pas thx   Vitamin D deficiency     Past Surgical History:  Procedure Laterality Date   APPENDECTOMY  1965   7th grade in the '60's   CESAREAN SECTION     x2 '84 & '85   COLONOSCOPY  10/13/2013   DIAGNOSTIC LAPAROSCOPY     POLYPECTOMY     TOTAL ABDOMINAL HYSTERECTOMY W/ BILATERAL SALPINGOOPHORECTOMY  08/1989   endometriosis    Prior to Admission medications   Medication Sig Start Date End Date Taking? Authorizing Provider  Calcium Citrate (CITRACAL PO) Take 1 capsule by mouth daily.   Yes [provider]  Probiotic Product (PROBIOTIC DAILY PO) Take 1 capsule by mouth daily. Flogen   Yes [provider]  Vitamin D,  Ergocalciferol, (DRISDOL) 1.25 MG (50000 UNIT) CAPS capsule TAKE 1 CAPSULE BY MOUTH ONE TIME PER WEEK Patient taking differently: Take 50,000 Units by mouth every 7 (seven) days. Wednesday 02/22/21  Yes [name], DO    Current Outpatient Medications  Medication Sig Dispense Refill   Calcium Citrate (CITRACAL PO) Take 1 capsule by mouth daily.     Probiotic Product (PROBIOTIC DAILY PO) Take 1 capsule by mouth daily. Flogen     Vitamin D, Ergocalciferol, (DRISDOL) 1.25 MG (50000 UNIT) CAPS capsule TAKE 1 CAPSULE BY MOUTH ONE TIME PER WEEK (Patient taking differently: Take 50,000 Units by mouth every 7 (seven) days. Wednesday) 12 capsule 3   Current Facility-Administered Medications  Medication Dose Route Frequency Provider Last Rate Last Admin   0.9 %  sodium chloride infusion  500 mL Intravenous Once Rashon Rezek, Lajuan Lines, MD        Allergies as of 04/18/2022 - Review Complete 04/18/2022  Allergen Reaction Noted   Codeine  08/11/2009   Influenza vaccines  09/02/2013   Sulfa antibiotics Nausea And Vomiting 07/12/2017    Family History  Problem Relation Age of Onset   Diabetes Mother    Osteoporosis Mother    Hypertension Mother    Colon cancer Father 39   Colon polyps Father    Stomach cancer Paternal Grandfather  Breast cancer Paternal Aunt        31's   Cancer Paternal Aunt        breast   Graves' disease Daughter    Esophageal cancer Neg Hx    Rectal cancer Neg Hx     Social History   Socioeconomic History   Marital status: Married    Spouse name: Elta Guadeloupe   Number of children: Not on file   Years of education: Not on file   Highest education level: Not on file  Occupational History   Not on file  Tobacco Use   Smoking status: Never   Smokeless tobacco: Never  Vaping Use   Vaping Use: Never used  Substance and Sexual Activity   Alcohol use: No   Drug use: No   Sexual activity: Yes    Partners: Male    Birth control/protection: Surgical    Comment:  Hysterectomy  Other Topics Concern   Not on file  Social History Narrative   Married to [place].  Her daughter is expecting a child in the next couple of days.  They will have a total of 2 grandchildren, both girls.   Social Determinants of Health   Financial Resource Strain: Not on file  Food Insecurity: Not on file  Transportation Needs: Not on file  Physical Activity: Not on file  Stress: Not on file  Social Connections: Not on file  Intimate Partner Violence: Not on file    Physical Exam: Vital signs in last 24 hours: '@BP'$  (!) 144/78   Pulse 78   Temp 98.4 F (36.9 C)   Ht '5\' 5"'$  (1.651 m)   Wt 166 lb (75.3 kg)   LMP 08/18/1989   SpO2 95%   BMI 27.62 kg/m  GEN: NAD EYE: Sclerae anicteric ENT: MMM CV: Non-tachycardic Pulm: CTA b/l GI: Soft, NT/ND NEURO:  Alert & Oriented x 3   Zenovia Jarred, MD [facility]  04/18/2022 8:05 AM

## 2022-04-18 NOTE — Progress Notes (Signed)
Pt's states no medical or surgical changes since previsit or office visit. 

## 2022-04-18 NOTE — Progress Notes (Signed)
Called to room to assist during endoscopic procedure.  Patient ID and intended procedure confirmed with present staff. Received instructions for my participation in the procedure from the performing physician.  

## 2022-04-18 NOTE — Patient Instructions (Signed)
YOU HAD AN ENDOSCOPIC PROCEDURE TODAY AT THE [facility]:   Refer to the procedure report that was given to you for any specific questions about what was found during the examination.  If the procedure report does not answer your questions, please call your gastroenterologist to clarify.  If you requested that your care partner not be given the details of your procedure findings, then the procedure report has been included in a sealed envelope for you to review at your convenience later.  YOU SHOULD EXPECT: Some feelings of bloating in the abdomen. Passage of more gas than usual.  Walking can help get rid of the air that was put into your GI tract during the procedure and reduce the bloating. If you had a lower endoscopy (such as a colonoscopy or flexible sigmoidoscopy) you may notice spotting of blood in your stool or on the toilet paper. If you underwent a bowel prep for your procedure, you may not have a normal bowel movement for a few days.  Please Note:  You might notice some irritation and congestion in your nose or some drainage.  This is from the oxygen used during your procedure.  There is no need for concern and it should clear up in a day or so.  SYMPTOMS TO REPORT IMMEDIATELY:  Following lower endoscopy (colonoscopy or flexible sigmoidoscopy):  Excessive amounts of blood in the stool  Significant tenderness or worsening of abdominal pains  Swelling of the abdomen that is new, acute  Fever of 100F or higher  Following upper endoscopy (EGD)  Vomiting of blood or coffee ground material  New chest pain or pain under the shoulder blades  Painful or persistently difficult swallowing  New shortness of breath  Fever of 100F or higher  Black, tarry-looking stools  For urgent or emergent issues, a gastroenterologist can be reached at any hour by calling [phone]. Do not use MyChart messaging for urgent concerns.    DIET:  We do recommend a small meal at first, but  then you may proceed to your regular diet.  Drink plenty of fluids but you should avoid alcoholic beverages for 24 hours.  ACTIVITY:  You should plan to take it easy for the rest of today and you should NOT DRIVE or use heavy machinery until tomorrow (because of the sedation medicines used during the test).    FOLLOW UP: Our staff will call the number listed on your records the next business day following your procedure.  We will call around 7:15- 8:00 am to check on you and address any questions or concerns that you may have regarding the information given to you following your procedure. If we do not reach you, we will leave a message.  If you develop any symptoms (ie: fever, flu-like symptoms, shortness of breath, cough etc.) before then, please call [phone].  If you test positive for Covid 19 in the 2 weeks post procedure, please call and report this information to us.    If any biopsies were taken you will be contacted by phone or by letter within the next 1-3 weeks.  Please call us at [phone] if you have not heard about the biopsies in 3 weeks.    SIGNATURES/CONFIDENTIALITY: You and/or your care partner have signed paperwork which will be entered into your electronic medical record.  These signatures attest to the fact that that the information above on your After Visit Summary has been reviewed and is understood.  Full responsibility of the confidentiality   of this discharge information lies with you and/or your care-partner.  

## 2022-04-18 NOTE — Op Note (Signed)
[facility] Patient Name: Krista Henderson Procedure Date: 04/18/2022 8:06 AM MRN: [id number] Endoscopist: Jerene Bears , MD Age: 71 Referring MD:  Date of Birth: [birth year] Gender: Female Account #: [id number] Procedure:                Colonoscopy Indications:              High risk colon cancer surveillance: Personal                            history of multiple adenomas, Family history of                            colon cancer in a first-degree relative (father)                            age 36 years, Last colonoscopy: May 2020 (6                            adenomas removed) Medicines:                Monitored Anesthesia Care Procedure:                Pre-Anesthesia Assessment:                           - Prior to the procedure, a History and Physical                            was performed, and patient medications and                            allergies were reviewed. The patient's tolerance of                            previous anesthesia was also reviewed. The risks                            and benefits of the procedure and the sedation                            options and risks were discussed with the patient.                            All questions were answered, and informed consent                            was obtained. Prior Anticoagulants: The patient has                            taken no previous anticoagulant or antiplatelet                            agents. ASA Grade Assessment: II - A patient with  mild systemic disease. After reviewing the risks                            and benefits, the patient was deemed in                            satisfactory condition to undergo the procedure.                           After obtaining informed consent, the colonoscope                            was passed under direct vision. Throughout the                            procedure, the patient's blood pressure, pulse, and                             oxygen saturations were monitored continuously. The                            PCF-HQ190L Colonoscope was introduced through the                            anus and advanced to the cecum, identified by                            palpation. The colonoscopy was performed without                            difficulty. The patient tolerated the procedure                            well. The quality of the bowel preparation was                            good. The ileocecal valve, appendiceal orifice, and                            rectum were photographed. Scope In: 8:11:35 AM Scope Out: 8:26:40 AM Scope Withdrawal Time: 0 hours 11 minutes 44 seconds  Total Procedure Duration: 0 hours 15 minutes 5 seconds  Findings:                 The digital rectal exam was normal.                           A 6 mm polyp was found in the transverse colon. The                            polyp was sessile. The polyp was removed with a                            cold snare. Resection and retrieval were complete.  Two sessile polyps were found in the descending                            colon. The polyps were 4 to 5 mm in size. These                            polyps were removed with a cold snare. Resection                            and retrieval were complete.                           The exam was otherwise without abnormality on                            direct and retroflexion views. Complications:            No immediate complications. Estimated Blood Loss:     Estimated blood loss was minimal. Impression:               - One 6 mm polyp in the transverse colon, removed                            with a cold snare. Resected and retrieved.                           - Two 4 to 5 mm polyps in the descending colon,                            removed with a cold snare. Resected and retrieved.                           - The examination was otherwise normal on direct                             and retroflexion views. Recommendation:           - Patient has a contact number available for                            emergencies. The signs and symptoms of potential                            delayed complications were discussed with the                            patient. Return to normal activities tomorrow.                            Written discharge instructions were provided to the                            patient.                           -  Resume previous diet.                           - Continue present medications.                           - Await pathology results.                           - Repeat colonoscopy is recommended for                            surveillance. The colonoscopy date will be                            determined after pathology results from today's                            exam become available for review. Jerene Bears, MD 04/18/2022 8:29:42 AM This report has been signed electronically.

## 2022-04-18 NOTE — Progress Notes (Signed)
To pacu, VSS. Report to [email or web address] 

## 2022-04-19 ENCOUNTER — Telehealth: Payer: Self-pay

## 2022-04-19 NOTE — Telephone Encounter (Signed)
Left message

## 2022-04-24 ENCOUNTER — Encounter: Payer: Self-pay | Admitting: Internal Medicine

## 2022-05-18 IMAGING — MG MM DIGITAL SCREENING BILAT W/ TOMO AND CAD
8 series · 8 of 24 positions shown · non-contrast
Comparison: Previous exam(s).

CLINICAL DATA: Screening.

EXAM:
DIGITAL SCREENING BILATERAL MAMMOGRAM WITH TOMOSYNTHESIS AND CAD
TECHNIQUE: Bilateral screening digital craniocaudal and mediolateral oblique
mammograms were obtained. Bilateral screening digital breast
tomosynthesis was performed. The images were evaluated with
computer-aided detection.

[R CC synth-2D]
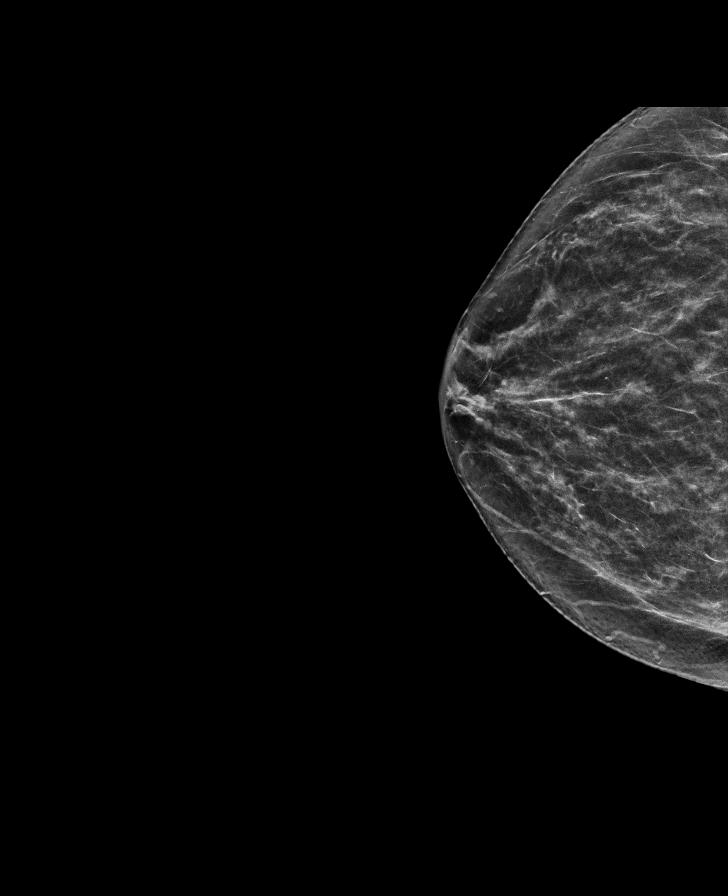

[L CC synth-2D]
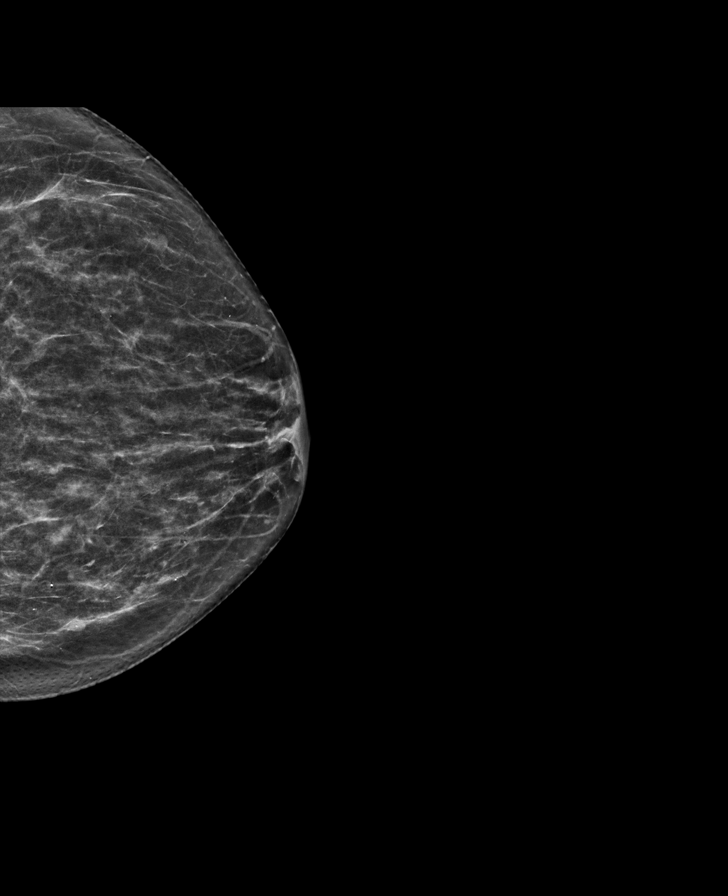

[R MLO synth-2D]
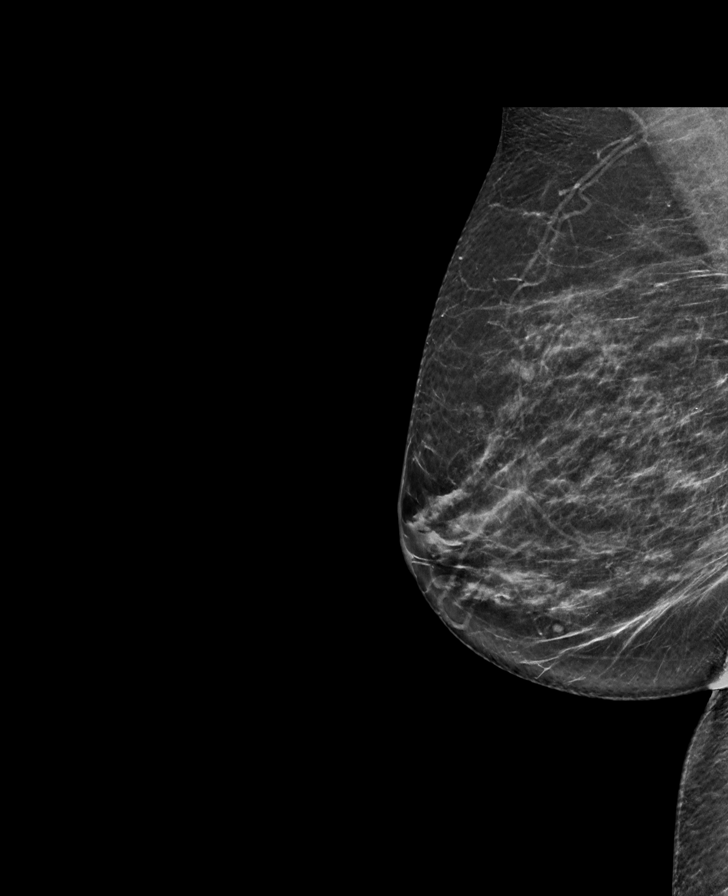

[L MLO synth-2D]
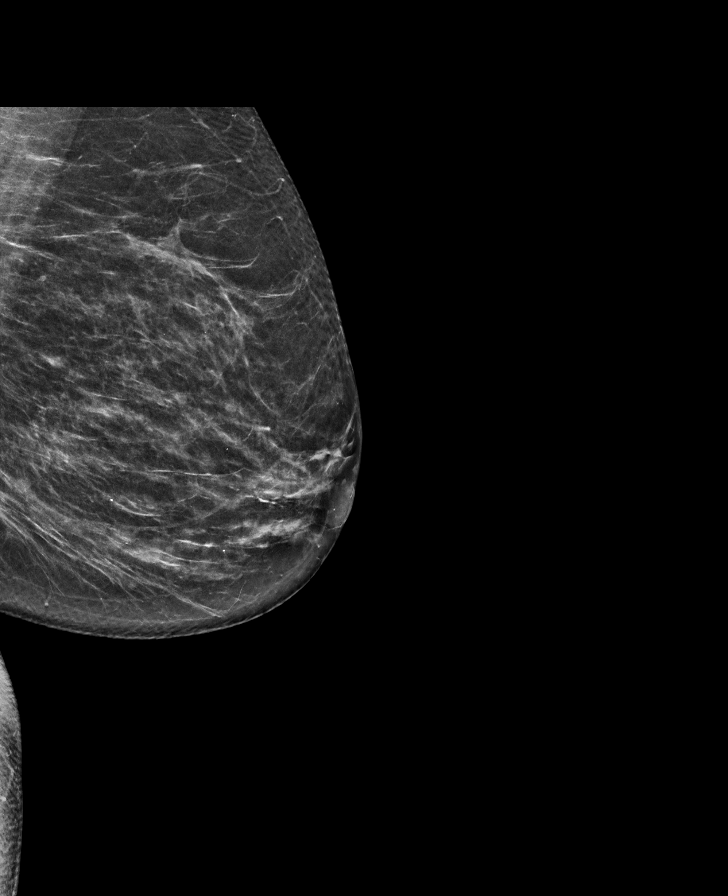

[L CC tomo · tomo slice 31/62.0]
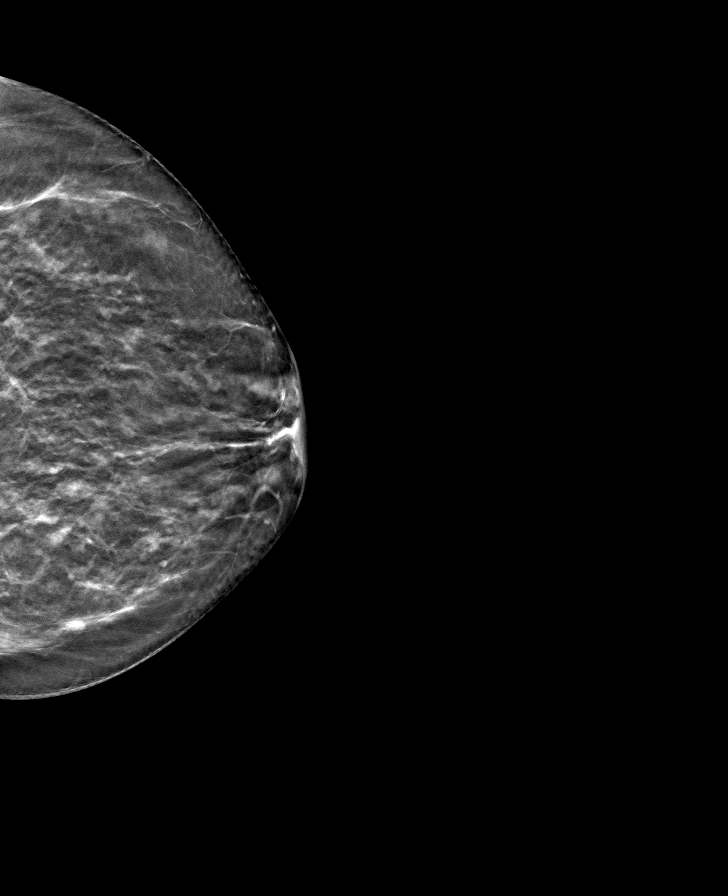

[L MLO tomo · tomo slice 34/67.0]
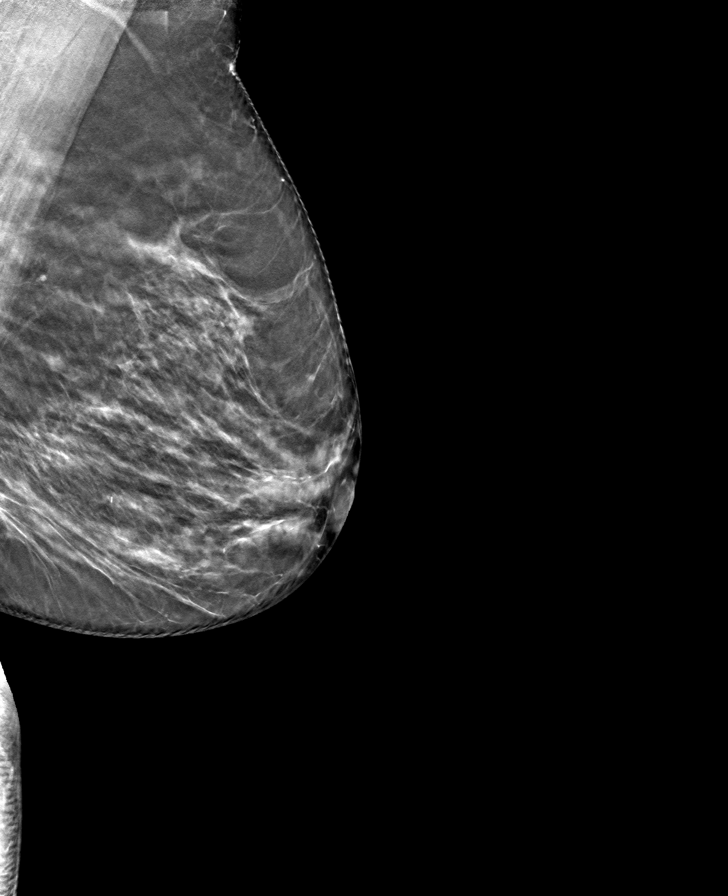

[R CC tomo · tomo slice 33/65.0]
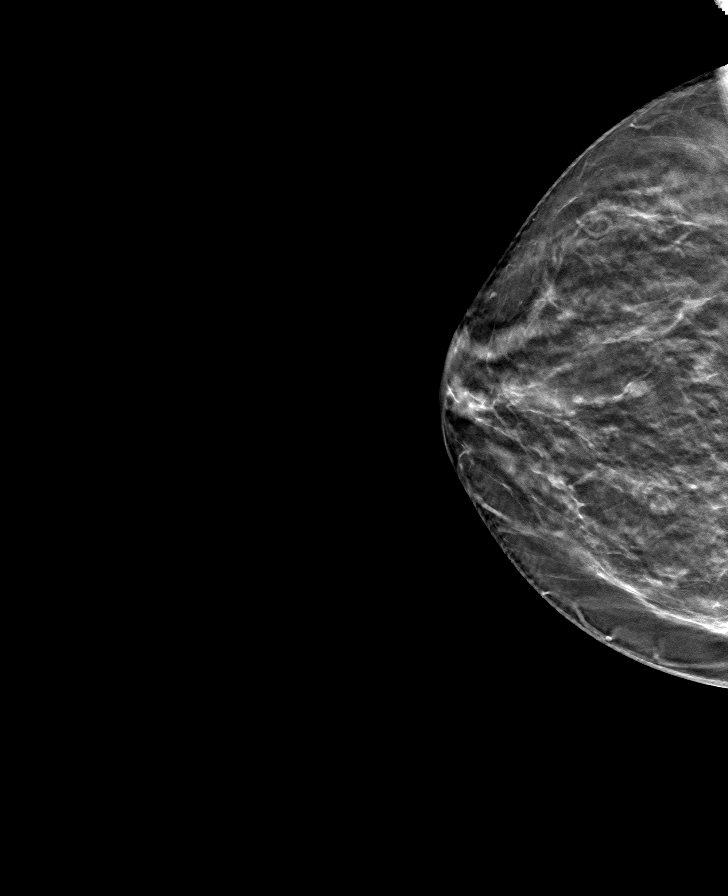

[R MLO tomo · tomo slice 33/65.0]
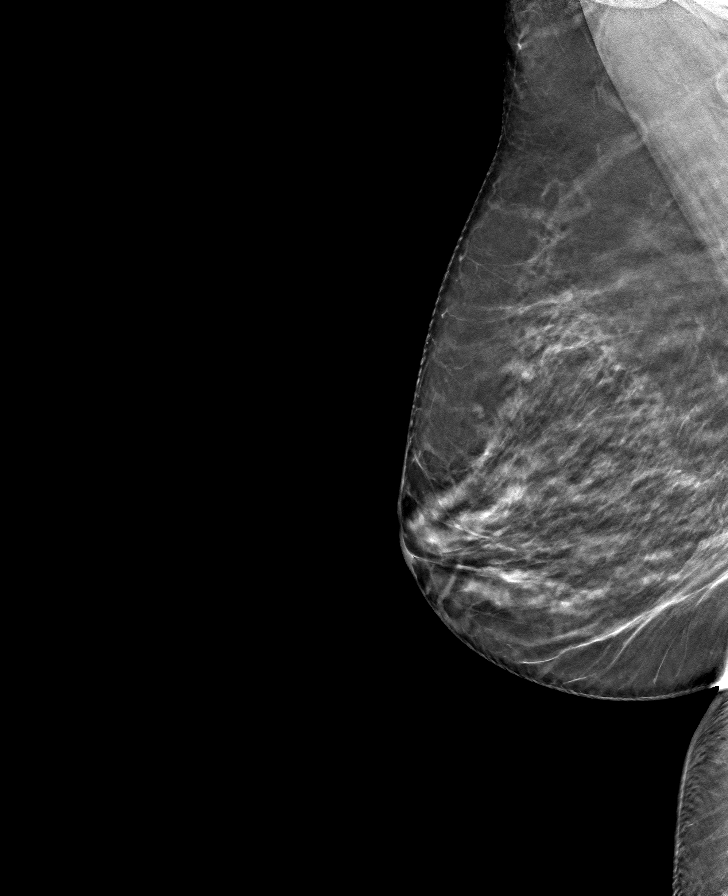

[8 of 24 positions shown; findings below may reference images not displayed]

ACR Breast Density Category c: The breast tissue is heterogeneously
dense, which may obscure small masses.
FINDINGS: There are no findings suspicious for malignancy.
IMPRESSION: No mammographic evidence of malignancy. A result letter of this
screening mammogram will be mailed directly to the patient.

RECOMMENDATION:
Screening mammogram in one year. (Code:Q3-W-BC3)

BI-RADS CATEGORY  1: Negative.

## 2022-05-26 ENCOUNTER — Ambulatory Visit: Payer: Medicare HMO | Attending: Cardiology | Admitting: Cardiology

## 2022-05-26 ENCOUNTER — Encounter: Payer: Self-pay | Admitting: Cardiology

## 2022-05-26 VITALS — BP 116/78 | HR 89 | Ht 64.5 in | Wt 163.0 lb

## 2022-05-26 DIAGNOSIS — I480 Paroxysmal atrial fibrillation: Secondary | ICD-10-CM

## 2022-05-26 NOTE — Progress Notes (Signed)
Electrophysiology Office Note   Date:  05/26/2022   ID:  LEIGH BLAS, DOB 02-23-1951, MRN [id number]  PCP:  [name], DO  Cardiologist:   Primary Electrophysiologist:  Chassidy Layson [name], MD    Chief Complaint: AF   History of Present Illness: Krista Henderson is a 71 y.o. female who is being seen today for the evaluation of AF at the request of [name], Ashly M, DO. Presenting today for electrophysiology evaluation.  She has a history of atrial fibrillation.  She presented to an emergency room in [place] with dizziness, vomiting, palpitations.  She was found to be in rapid atrial fibrillation at the time.  She was started on diltiazem and Eliquis and converted to sinus rhythm.  Today, denies symptoms of palpitations, chest pain, shortness of breath, orthopnea, PND, lower extremity edema, claudication, dizziness, presyncope, syncope, bleeding, or neurologic sequela. The patient is tolerating medications without difficulties.  She is currently feeling well.  She has had no further episodes of atrial fibrillation.  She is able to all of her daily activities.  She is overall quite happy with her control.   Past Medical History:  Diagnosis Date   Abnormal Pap smear of cervix    Arthritis    toe , finger, both hips   Atrial fibrillation ([place])    [place] episodes   Blood transfusion without reported diagnosis    Bone spur 09/2017   Left hip   Endometriosis    Hx of adenomatous colonic polyps    Hx of removal of cyst    right ear   Hyperlipidemia    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   WBC 3,000 08/30/11 52 P 43 L   Migraine without aura    pas thx   Vitamin D deficiency    Past Surgical History:  Procedure Laterality Date   APPENDECTOMY  1965   7th grade in the '60's   CESAREAN SECTION     x2 '84 & '85   COLONOSCOPY  10/13/2013   DIAGNOSTIC LAPAROSCOPY     POLYPECTOMY     TOTAL ABDOMINAL HYSTERECTOMY W/ BILATERAL  SALPINGOOPHORECTOMY  08/1989   endometriosis     Current Outpatient Medications  Medication Sig Dispense Refill   Calcium Citrate (CITRACAL PO) Take 1 capsule by mouth daily.     Probiotic Product (PROBIOTIC DAILY PO) Take 1 capsule by mouth daily. Flogen     Vitamin D, Ergocalciferol, (DRISDOL) 1.25 MG (50000 UNIT) CAPS capsule TAKE 1 CAPSULE BY MOUTH ONE TIME PER WEEK 12 capsule 3   No current facility-administered medications for this visit.    Allergies:   Codeine, Influenza vaccines, and Sulfa antibiotics   Social History:  The patient  reports that she has never smoked. She has never used smokeless tobacco. She reports that she does not drink alcohol and does not use drugs.   Family History:  The patient's family history includes Breast cancer in her paternal aunt; Cancer in her paternal aunt; Colon cancer (age of onset: 75) in her father; Colon polyps in her father; Diabetes in her mother; Berenice Primas' disease in her daughter; Hypertension in her mother; Osteoporosis in her mother; Stomach cancer in her paternal grandfather.   ROS:  Please see the history of present illness.   Otherwise, review of systems is positive for none.   All other systems are reviewed and negative.   PHYSICAL EXAM: VS:  BP 116/78   Pulse 89  Ht 5' 4.5" (1.638 m)   Wt 163 lb (73.9 kg)   LMP 08/18/1989   BMI 27.55 kg/m  , BMI Body mass index is 27.55 kg/m. GEN: Well nourished, well developed, in no acute distress  HEENT: normal  Neck: no JVD, carotid bruits, or masses Cardiac: RRR; no murmurs, rubs, or gallops,no edema  Respiratory:  clear to auscultation bilaterally, normal work of breathing GI: soft, nontender, nondistended, + BS MS: no deformity or atrophy  Skin: warm and dry Neuro:  Strength and sensation are intact Psych: euthymic mood, full affect  EKG:  EKG is ordered today. Personal review of the ekg ordered shows sinus rhythm, rate 89  Recent Labs: 11/08/2021: Hemoglobin 13.9; Platelets  200; TSH 3.142 11/21/2021: ALT 14; BUN 16; Creatinine, Ser 0.88; Potassium 4.2; Sodium 140    Lipid Panel     Component Value Date/Time   CHOL 224 (H) 11/21/2021 0827   TRIG 138 11/21/2021 0827   TRIG 190 (H) 08/20/2013 0835   HDL 60 11/21/2021 0827   HDL 57 08/20/2013 0835   CHOLHDL 3.7 11/21/2021 0827   LDLCALC 140 (H) 11/21/2021 0827   LDLCALC 115 (H) 08/20/2013 0835     Wt Readings from Last 3 Encounters:  05/26/22 163 lb (73.9 kg)  04/18/22 166 lb (75.3 kg)  03/28/22 160 lb (72.6 kg)      Other studies Reviewed: Additional studies/ records that were reviewed today include: TTE 02/02/2021 Review of the above records today demonstrates:  LV cavity size is normal Mild concentric LVH Ejection fraction 60 to 65% Right ventricle mildly dilated Normal pulmonary artery pressure   ASSESSMENT AND PLAN:  1.  Paroxysmal atrial fibrillation: CHA2DS2-VASc of 1.  Not anticoagulated.  Not on any rate controlling medications that she has had minimal episodes of atrial fibrillation.  She is overall quite happy with her control.  We Morton Simson make no changes at this time.  We Tajia Szeliga continue with current management.   Current medicines are reviewed at length with the patient today.   The patient does not have concerns regarding her medicines.  The following changes were made today: none  Labs/ tests ordered today include:  Orders Placed This Encounter  Procedures   EKG 12-Lead     Disposition:   FU 12 months  Signed, Lebaron Bautch [name], MD  05/26/2022 1:49 PM     [address] [place] [address] [phone] (office) [phone] (fax)

## 2022-05-26 NOTE — Patient Instructions (Signed)
Medication Instructions:  Your physician recommends that you continue on your current medications as directed. Please refer to the Current Medication list given to you today.  *If you need a refill on your cardiac medications before your next appointment, please call your pharmacy*   Lab Work: None ordered   Testing/Procedures: None ordered   Follow-Up: At [facility], you and your health needs are our priority.  As part of our continuing mission to provide you with exceptional heart care, we have created designated Provider Care Teams.  These Care Teams include your primary Cardiologist (physician) and Advanced Practice Providers (APPs -  Physician Assistants and Nurse Practitioners) who all work together to provide you with the care you need, when you need it.  Your next appointment:   1 year(s)  The format for your next appointment:   In Person  Provider:   Tommye Standard, PA-C    Thank you for choosing [facility]!!   Trinidad Curet, RN [phone]  Other Instructions   Important Information About Sugar

## 2022-05-31 ENCOUNTER — Other Ambulatory Visit: Payer: Self-pay

## 2022-05-31 ENCOUNTER — Other Ambulatory Visit: Payer: Self-pay | Admitting: Family Medicine

## 2022-05-31 DIAGNOSIS — E559 Vitamin D deficiency, unspecified: Secondary | ICD-10-CM

## 2022-06-05 ENCOUNTER — Telehealth (HOSPITAL_BASED_OUTPATIENT_CLINIC_OR_DEPARTMENT_OTHER): Payer: Self-pay

## 2022-06-05 NOTE — Telephone Encounter (Signed)
Patient has called several times today worried that she has found a lump in her right breast that she is very concerned about. I spoke with patient's daughter earlier this morning and did inform her that Krista Henderson is out of the office today and would be in surgery all day on Tuesday. I explained that I would have our scheduler to give her a call back to get her in to see someone. tbw

## 2022-06-06 ENCOUNTER — Other Ambulatory Visit (HOSPITAL_BASED_OUTPATIENT_CLINIC_OR_DEPARTMENT_OTHER): Payer: Self-pay | Admitting: Obstetrics & Gynecology

## 2022-06-06 DIAGNOSIS — N6314 Unspecified lump in the right breast, lower inner quadrant: Secondary | ICD-10-CM

## 2022-06-06 NOTE — Telephone Encounter (Signed)
Called patient to offer an appointment for tomorrow 06/07/2022 at 8:00am. Patient excepted and will be here then. tbw

## 2022-06-07 ENCOUNTER — Encounter (HOSPITAL_BASED_OUTPATIENT_CLINIC_OR_DEPARTMENT_OTHER): Payer: Self-pay | Admitting: Obstetrics & Gynecology

## 2022-06-07 ENCOUNTER — Ambulatory Visit (HOSPITAL_BASED_OUTPATIENT_CLINIC_OR_DEPARTMENT_OTHER): Payer: Medicare HMO | Admitting: Obstetrics & Gynecology

## 2022-06-07 VITALS — BP 142/72 | HR 80 | Ht 64.5 in | Wt 162.2 lb

## 2022-06-07 DIAGNOSIS — N6314 Unspecified lump in the right breast, lower inner quadrant: Secondary | ICD-10-CM

## 2022-06-07 NOTE — Progress Notes (Signed)
GYNECOLOGY  VISIT  CC:   right breast mass  HPI: 71 y.o. G22P2002 Married White or Caucasian female here for concerns about new right breast mass.  Pt just recently noticed this.  It is tender.  No recent trauma.  No nipple discharge or blood.  Last MMG was 07/2021 and was normal.  Has family hx of breast cancer in paternal aunt diagnosed in her 109's.  Past Medical History:  Diagnosis Date   Abnormal Pap smear of cervix    Arthritis    toe , finger, both hips   Atrial fibrillation ([place])    [place]rs [place]- no further episodes   Blood transfusion without reported diagnosis    Bone spur 09/2017   Left hip   Endometriosis    Hx of adenomatous colonic polyps    Hx of removal of cyst    right ear   Hyperlipidemia    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   WBC 3,000 08/30/11 52 P 43 L   Migraine without aura    pas thx   Vitamin D deficiency     MEDS:   Current Outpatient Medications on File Prior to Visit  Medication Sig Dispense Refill   Calcium Citrate (CITRACAL PO) Take 1 capsule by mouth daily.     Probiotic Product (PROBIOTIC DAILY PO) Take 1 capsule by mouth daily. Flogen     Vitamin D, Ergocalciferol, (DRISDOL) 1.25 MG (50000 UNIT) CAPS capsule TAKE 1 CAPSULE BY MOUTH ONE TIME PER WEEK 12 capsule 0   No current facility-administered medications on file prior to visit.    ALLERGIES: Codeine, Influenza vaccines, and Sulfa antibiotics  SH:  married, non smoker  Review of Systems  Constitutional: Negative.     PHYSICAL EXAMINATION:    BP (!) 142/72 (BP Location: Left Arm, Patient Position: Sitting, Cuff Size: Large)   Pulse 80   Ht 5' 4.5" (1.638 m) Comment: reported  Wt 162 lb 3.2 oz (73.6 kg)   LMP 08/18/1989   BMI 27.41 kg/m     Physical Exam Constitutional:      Appearance: Normal appearance.  Chest:  Breasts:    Right: Mass and tenderness present. No bleeding, inverted nipple, nipple discharge or skin change.     Left: No bleeding, inverted  nipple, mass, nipple discharge, skin change or tenderness.    Lymphadenopathy:     Upper Body:     Right upper body: No supraclavicular or axillary adenopathy.     Left upper body: No supraclavicular or axillary adenopathy.  Neurological:     Mental Status: She is alert.  Psychiatric:        Mood and Affect: Mood normal.      Assessment/Plan: 1. Mass of lower inner quadrant of right breast - diagnostic MMG of right breast ordered - US of right axilla also ordered for diagnostic imaging. - pt aware she will need imaging of left breast in November with normal/routine MMG is due.

## 2022-06-14 ENCOUNTER — Ambulatory Visit
Admission: RE | Admit: 2022-06-14 | Discharge: 2022-06-14 | Disposition: A | Discharge status: Home / Self Care | Payer: Medicare HMO | Source: Ambulatory Visit | Attending: Obstetrics & Gynecology | Admitting: Obstetrics & Gynecology

## 2022-06-14 ENCOUNTER — Other Ambulatory Visit: Payer: Self-pay | Admitting: Obstetrics & Gynecology

## 2022-06-14 DIAGNOSIS — R922 Inconclusive mammogram: Secondary | ICD-10-CM | POA: Diagnosis not present

## 2022-06-14 DIAGNOSIS — N6314 Unspecified lump in the right breast, lower inner quadrant: Secondary | ICD-10-CM

## 2022-06-14 DIAGNOSIS — N6489 Other specified disorders of breast: Secondary | ICD-10-CM | POA: Diagnosis not present

## 2022-06-14 DIAGNOSIS — Z1231 Encounter for screening mammogram for malignant neoplasm of breast: Secondary | ICD-10-CM

## 2022-07-10 ENCOUNTER — Telehealth: Payer: Self-pay | Admitting: Family Medicine

## 2022-07-10 NOTE — Telephone Encounter (Signed)
Pt aware labs in place

## 2022-07-19 ENCOUNTER — Other Ambulatory Visit: Payer: Medicare HMO

## 2022-07-19 DIAGNOSIS — E7800 Pure hypercholesterolemia, unspecified: Secondary | ICD-10-CM | POA: Diagnosis not present

## 2022-07-20 LAB — CMP14+EGFR
ALT: 9 IU/L (ref 0–32)
AST: 18 IU/L (ref 0–40)
Albumin/Globulin Ratio: 1.8 (ref 1.2–2.2)
Albumin: 4.2 g/dL (ref 3.8–4.8)
Alkaline Phosphatase: 77 IU/L (ref 44–121)
BUN/Creatinine Ratio: 14 (ref 12–28)
BUN: 12 mg/dL (ref 8–27)
Bilirubin Total: 0.7 mg/dL (ref 0.0–1.2)
CO2: 23 mmol/L (ref 20–29)
Calcium: 9.4 mg/dL (ref 8.7–10.3)
Chloride: 105 mmol/L (ref 96–106)
Creatinine, Ser: 0.83 mg/dL (ref 0.57–1.00)
Globulin, Total: 2.4 g/dL (ref 1.5–4.5)
Glucose: 118 mg/dL — ABNORMAL HIGH (ref 70–99)
Potassium: 4 mmol/L (ref 3.5–5.2)
Sodium: 140 mmol/L (ref 134–144)
Total Protein: 6.6 g/dL (ref 6.0–8.5)
eGFR: 75 mL/min/1.73

## 2022-07-20 LAB — LIPID PANEL
Chol/HDL Ratio: 4.1 ratio (ref 0.0–4.4)
Cholesterol, Total: 203 mg/dL — ABNORMAL HIGH (ref 100–199)
HDL: 49 mg/dL (ref >39)
LDL Chol Calc (NIH): 127 mg/dL — ABNORMAL HIGH (ref 0–99)
Triglycerides: 153 mg/dL — ABNORMAL HIGH (ref 0–149)
VLDL Cholesterol Cal: 27 mg/dL (ref 5–40)

## 2022-07-24 ENCOUNTER — Encounter: Payer: Self-pay | Admitting: Family Medicine

## 2022-07-24 ENCOUNTER — Ambulatory Visit (INDEPENDENT_AMBULATORY_CARE_PROVIDER_SITE_OTHER): Payer: Medicare HMO | Admitting: Family Medicine

## 2022-07-24 VITALS — BP 119/72 | HR 91 | Temp 98.3°F | Ht 64.5 in | Wt 160.2 lb

## 2022-07-24 DIAGNOSIS — Z23 Encounter for immunization: Secondary | ICD-10-CM | POA: Diagnosis not present

## 2022-07-24 DIAGNOSIS — I480 Paroxysmal atrial fibrillation: Secondary | ICD-10-CM | POA: Diagnosis not present

## 2022-07-24 DIAGNOSIS — E782 Mixed hyperlipidemia: Secondary | ICD-10-CM

## 2022-07-24 DIAGNOSIS — E559 Vitamin D deficiency, unspecified: Secondary | ICD-10-CM

## 2022-07-24 DIAGNOSIS — R739 Hyperglycemia, unspecified: Secondary | ICD-10-CM

## 2022-07-24 DIAGNOSIS — M7989 Other specified soft tissue disorders: Secondary | ICD-10-CM | POA: Diagnosis not present

## 2022-07-24 MED ORDER — VITAMIN D (ERGOCALCIFEROL) 1.25 MG (50000 UNIT) PO CAPS: 50000.0000 [IU] | ORAL_CAPSULE | ORAL | 4 refills | Status: DC

## 2022-07-24 NOTE — Progress Notes (Signed)
Subjective: CC:HLD PCP: [name], DO GEZ:[id number] Krista Henderson is a 71 y.o. female presenting to clinic today for:  1. HLD Patient now with elevated TGs and LDL.  She admits that she has been much more sedentary now that her twin grandchildren were born.  She has also not been following a restrictive diet.  She would like to work on lifestyle modification for the next 3 months before proceeding with Nexletol or Zetia.  No chest pain, shortness of breath reported.  She does have an intermittent cough but this seems to be related to dry, hot air.  2.  Hand fullness Patient with ongoing soft tissue swelling on the palmar aspect of the left hand.  This does not cause any pain nor is she having any sensory changes or restrictive movement of the hand but she thinks maybe she would like to get it evaluated.  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  ROS: Per HPI  Allergies  Allergen Reactions   Codeine     REACTION: rash/nausea/vomiting   Influenza Vaccines     "injections site turned black color and had a hen size egg lump at site". Per pt she said not sure if the reactions was from a spider bite or flu shot.     Sulfa Antibiotics Nausea And Vomiting   Past Medical History:  Diagnosis Date   Abnormal Pap smear of cervix    Arthritis    toe , finger, both hips   Atrial fibrillation ([place])    [place]   Blood transfusion without reported diagnosis    Bone spur 09/2017   Left hip   Endometriosis    Hx of adenomatous colonic polyps    Hx of removal of cyst    right ear   Hyperlipidemia    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   WBC 3,000 08/30/11 52 P 43 L   Migraine without aura    pas thx   Vitamin D deficiency     Current Outpatient Medications:    Calcium Citrate (CITRACAL PO), Take 1 capsule by mouth daily., Disp: , Rfl:    Probiotic Product (PROBIOTIC DAILY PO), Take 1 capsule by mouth  daily. Flogen, Disp: , Rfl:    Vitamin D, Ergocalciferol, (DRISDOL) 1.25 MG (50000 UNIT) CAPS capsule, TAKE 1 CAPSULE BY MOUTH ONE TIME PER WEEK, Disp: 12 capsule, Rfl: 0 Social History   Socioeconomic History   Marital status: Married    Spouse name: Elta Guadeloupe   Number of children: Not on file   Years of education: Not on file   Highest education level: Not on file  Occupational History   Not on file  Tobacco Use   Smoking status: Never   Smokeless tobacco: Never  Vaping Use   Vaping Use: Never used  Substance and Sexual Activity   Alcohol use: No   Drug use: No   Sexual activity: Yes    Partners: Male    Birth control/protection: Surgical    Comment: Hysterectomy  Other Topics Concern   Not on file  Social History Narrative   Married to [place].  Her daughter is expecting a child in the next couple of days.  They will have a total of 2 grandchildren, both girls.   Social Determinants of Health   Financial Resource Strain: Not on file  Food Insecurity: Not on file  Transportation Needs: Not on file  Physical Activity:  Not on file  Stress: Not on file  Social Connections: Not on file  Intimate Partner Violence: Not on file   Family History  Problem Relation Age of Onset   Diabetes Mother    Osteoporosis Mother    Hypertension Mother    Colon cancer Father 90   Colon polyps Father    Stomach cancer Paternal Alanda Slim' disease Daughter    Breast cancer Paternal Aunt        97's   Cancer Paternal Aunt        breast   Esophageal cancer Neg Hx    Rectal cancer Neg Hx     Objective: Office vital signs reviewed. BP 119/72   Pulse 91   Temp 98.3 F (36.8 C)   Ht 5' 4.5" (1.638 m)   Wt 160 lb 3.2 oz (72.7 kg)   LMP 08/18/1989   SpO2 95%   BMI 27.07 kg/m   Physical Examination:  General: Awake, alert, well nourished, No acute distress HEENT: sclera white, MMM Cardio: regular rate and rhythm, S1S2 heard, no murmurs appreciated Pulm: clear to  auscultation bilaterally, no wheezes, rhonchi or rales; normal work of breathing on room air MSK: Palmar aspect of the left hand between digits 4 and 5 with an area of soft tissue swelling that is oblong.  It is not indurated and minimally fluctuant.  Is not well-circumscribed  Assessment/ Plan: 71 y.o. female   Mixed hyperlipidemia - Plan: CMP14+EGFR, Lipid panel  Elevated serum glucose - Plan: Bayer DCA Hb A1c Waived  Encounter for immunization - Plan: Flu Vaccine MDCK QUAD PF  Vitamin D deficiency - Plan: Vitamin D, Ergocalciferol, (DRISDOL) 1.25 MG (50000 UNIT) CAPS capsule, VITAMIN D 25 Hydroxy (Vit-D Deficiency, Fractures)  Paroxysmal A-fib (HCC) - Plan: CBC  Swelling of left hand - Plan: Ambulatory referral to Hand Surgery  Repeat fasting lipid, liver function test in 3 months.  We will add A1c to today's labs and then repeat again in 3 months given elevation in sugar noted on metabolic panel  Influenza vaccination administered.  Future orders for vitamin D, lipid, CMP, A1c and CBC placed  Renewal of vitamin D prescription sent  Heart is regular rate and rhythm.  Referral to hand surgery for further evaluation of this unusual left-sided interdigital swelling.   No orders of the defined types were placed in this encounter.  No orders of the defined types were placed in this encounter.    [name], DO [place] [phone]

## 2022-07-25 ENCOUNTER — Encounter: Payer: Self-pay | Admitting: Orthopaedic Surgery

## 2022-07-31 ENCOUNTER — Other Ambulatory Visit: Payer: Medicare HMO

## 2022-07-31 ENCOUNTER — Telehealth: Payer: Self-pay | Admitting: Orthopedic Surgery

## 2022-07-31 DIAGNOSIS — R739 Hyperglycemia, unspecified: Secondary | ICD-10-CM

## 2022-07-31 LAB — BAYER DCA HB A1C WAIVED: HB A1C (BAYER DCA - WAIVED): 6 % — ABNORMAL HIGH (ref 4.8–5.6)

## 2022-07-31 NOTE — Telephone Encounter (Signed)
I spoke w/the pt, she was confused and didn't know why we got a referral.  I explained why and she stated that she'll decide what she's going to do and she'll follow up with [facility] first.  Declined to schedule at this time.

## 2022-08-21 ENCOUNTER — Ambulatory Visit: Payer: Medicare HMO

## 2022-08-23 DIAGNOSIS — M25551 Pain in right hip: Secondary | ICD-10-CM | POA: Diagnosis not present

## 2022-10-11 ENCOUNTER — Ambulatory Visit
Admission: RE | Admit: 2022-10-11 | Discharge: 2022-10-11 | Disposition: A | Discharge status: Home / Self Care | Payer: Medicare HMO | Source: Ambulatory Visit | Attending: Obstetrics & Gynecology | Admitting: Obstetrics & Gynecology

## 2022-10-11 DIAGNOSIS — Z1231 Encounter for screening mammogram for malignant neoplasm of breast: Secondary | ICD-10-CM

## 2022-11-16 ENCOUNTER — Encounter: Payer: Self-pay | Admitting: Radiology

## 2022-11-20 DIAGNOSIS — H5213 Myopia, bilateral: Secondary | ICD-10-CM | POA: Diagnosis not present

## 2022-11-20 DIAGNOSIS — H2513 Age-related nuclear cataract, bilateral: Secondary | ICD-10-CM | POA: Diagnosis not present

## 2022-11-30 DIAGNOSIS — Z0100 Encounter for examination of eyes and vision without abnormal findings: Secondary | ICD-10-CM | POA: Diagnosis not present

## 2022-12-11 ENCOUNTER — Other Ambulatory Visit: Payer: Medicare HMO

## 2022-12-11 DIAGNOSIS — E782 Mixed hyperlipidemia: Secondary | ICD-10-CM | POA: Diagnosis not present

## 2022-12-11 DIAGNOSIS — E559 Vitamin D deficiency, unspecified: Secondary | ICD-10-CM | POA: Diagnosis not present

## 2022-12-11 DIAGNOSIS — L570 Actinic keratosis: Secondary | ICD-10-CM | POA: Diagnosis not present

## 2022-12-11 DIAGNOSIS — X32XXXD Exposure to sunlight, subsequent encounter: Secondary | ICD-10-CM | POA: Diagnosis not present

## 2022-12-11 DIAGNOSIS — I480 Paroxysmal atrial fibrillation: Secondary | ICD-10-CM

## 2022-12-11 DIAGNOSIS — Z1283 Encounter for screening for malignant neoplasm of skin: Secondary | ICD-10-CM | POA: Diagnosis not present

## 2022-12-11 DIAGNOSIS — R739 Hyperglycemia, unspecified: Secondary | ICD-10-CM | POA: Diagnosis not present

## 2022-12-11 DIAGNOSIS — D225 Melanocytic nevi of trunk: Secondary | ICD-10-CM | POA: Diagnosis not present

## 2022-12-11 LAB — LIPID PANEL

## 2022-12-12 ENCOUNTER — Encounter: Payer: Self-pay | Admitting: Family Medicine

## 2022-12-12 ENCOUNTER — Ambulatory Visit (INDEPENDENT_AMBULATORY_CARE_PROVIDER_SITE_OTHER): Payer: Medicare HMO | Admitting: Family Medicine

## 2022-12-12 VITALS — BP 127/78 | HR 82 | Temp 98.8°F | Ht 64.0 in | Wt 159.0 lb

## 2022-12-12 DIAGNOSIS — R7303 Prediabetes: Secondary | ICD-10-CM | POA: Diagnosis not present

## 2022-12-12 DIAGNOSIS — R69 Illness, unspecified: Secondary | ICD-10-CM | POA: Diagnosis not present

## 2022-12-12 DIAGNOSIS — E782 Mixed hyperlipidemia: Secondary | ICD-10-CM

## 2022-12-12 DIAGNOSIS — M19072 Primary osteoarthritis, left ankle and foot: Secondary | ICD-10-CM | POA: Diagnosis not present

## 2022-12-12 DIAGNOSIS — F430 Acute stress reaction: Secondary | ICD-10-CM

## 2022-12-12 LAB — CMP14+EGFR
ALT: 12 IU/L (ref 0–32)
AST: 16 IU/L (ref 0–40)
Albumin/Globulin Ratio: 1.8 (ref 1.2–2.2)
Albumin: 4.4 g/dL (ref 3.8–4.8)
Alkaline Phosphatase: 82 IU/L (ref 44–121)
BUN/Creatinine Ratio: 18 (ref 12–28)
BUN: 15 mg/dL (ref 8–27)
Bilirubin Total: 1 mg/dL (ref 0.0–1.2)
CO2: 23 mmol/L (ref 20–29)
Calcium: 9.3 mg/dL (ref 8.7–10.3)
Chloride: 102 mmol/L (ref 96–106)
Creatinine, Ser: 0.84 mg/dL (ref 0.57–1.00)
Globulin, Total: 2.4 g/dL (ref 1.5–4.5)
Glucose: 120 mg/dL — ABNORMAL HIGH (ref 70–99)
Potassium: 4.2 mmol/L (ref 3.5–5.2)
Sodium: 140 mmol/L (ref 134–144)
Total Protein: 6.8 g/dL (ref 6.0–8.5)
eGFR: 74 mL/min/{1.73_m2} (ref >59)

## 2022-12-12 LAB — LIPID PANEL
Chol/HDL Ratio: 3.9 ratio (ref 0.0–4.4)
Cholesterol, Total: 229 mg/dL — ABNORMAL HIGH (ref 100–199)
HDL: 59 mg/dL (ref >39)
LDL Chol Calc (NIH): 139 mg/dL — ABNORMAL HIGH (ref 0–99)
Triglycerides: 172 mg/dL — ABNORMAL HIGH (ref 0–149)
VLDL Cholesterol Cal: 31 mg/dL (ref 5–40)

## 2022-12-12 LAB — CBC
Hematocrit: 38.6 % (ref 34.0–46.6)
Hemoglobin: 13 g/dL (ref 11.1–15.9)
MCH: 32.4 pg (ref 26.6–33.0)
MCHC: 33.7 g/dL (ref 31.5–35.7)
MCV: 96 fL (ref 79–97)
Platelets: 194 10*3/uL (ref 150–450)
RBC: 4.01 x10E6/uL (ref 3.77–5.28)
RDW: 11.6 % — ABNORMAL LOW (ref 11.7–15.4)
WBC: 3.9 10*3/uL (ref 3.4–10.8)

## 2022-12-12 LAB — VITAMIN D 25 HYDROXY (VIT D DEFICIENCY, FRACTURES): Vit D, 25-Hydroxy: 31.8 ng/mL (ref 30.0–100.0)

## 2022-12-12 NOTE — Progress Notes (Signed)
Subjective: CC:*** PCP: [name], DO [id number] Krista Henderson is a 72 y.o. female presenting to clinic today for:  1. ***   ROS: Per HPI  Allergies  Allergen Reactions   Codeine     REACTION: rash/nausea/vomiting   Influenza Vaccines     "injections site turned black color and had a hen size egg lump at site". Per pt she said not sure if the reactions was from a spider bite or flu shot.     Sulfa Antibiotics Nausea And Vomiting   Past Medical History:  Diagnosis Date   Abnormal Pap smear of cervix    Arthritis    toe , finger, both hips   Atrial fibrillation ([place])    [place] no further episodes   Blood transfusion without reported diagnosis    Bone spur 09/2017   Left hip   Endometriosis    Hx of adenomatous colonic polyps    Hx of removal of cyst    right ear   Hyperlipidemia    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   WBC 3,000 08/30/11 52 P 43 L   Migraine without aura    pas thx   Vitamin D deficiency     Current Outpatient Medications:    Calcium Citrate (CITRACAL PO), Take 1 capsule by mouth daily., Disp: , Rfl:    Probiotic Product (PROBIOTIC DAILY PO), Take 1 capsule by mouth daily. Flogen, Disp: , Rfl:    Vitamin D, Ergocalciferol, (DRISDOL) 1.25 MG (50000 UNIT) CAPS capsule, Take 1 capsule (50,000 Units total) by mouth every 7 (seven) days., Disp: 12 capsule, Rfl: 4 Social History   Socioeconomic History   Marital status: Married    Spouse name: Elta Guadeloupe   Number of children: Not on file   Years of education: Not on file   Highest education level: Not on file  Occupational History   Not on file  Tobacco Use   Smoking status: Never   Smokeless tobacco: Never  Vaping Use   Vaping Use: Never used  Substance and Sexual Activity   Alcohol use: No   Drug use: No   Sexual activity: Yes    Partners: Male    Birth control/protection: Surgical    Comment: Hysterectomy  Other Topics Concern   Not on file  Social History  Narrative   Married to [place].  Her daughter is expecting a child in the next couple of days.  They will have a total of 2 grandchildren, both girls.   Social Determinants of Health   Financial Resource Strain: Not on file  Food Insecurity: Not on file  Transportation Needs: Not on file  Physical Activity: Not on file  Stress: Not on file  Social Connections: Not on file  Intimate Partner Violence: Not on file   Family History  Problem Relation Age of Onset   Diabetes Mother    Osteoporosis Mother    Hypertension Mother    Colon cancer Father 53   Colon polyps Father    Stomach cancer Paternal Alanda Slim' disease Daughter    Breast cancer Paternal Aunt        24's   Cancer Paternal Aunt        breast   Esophageal cancer Neg Hx    Rectal cancer Neg Hx     Objective: Office vital signs reviewed. LMP 08/18/1989   Physical Examination:  General: Awake, alert, *** nourished, No acute distress HEENT: Normal  Neck: No masses palpated. No lymphadenopathy    Ears: Tympanic membranes intact, normal light reflex, no erythema, no bulging    Eyes: PERRLA, extraocular membranes intact, sclera ***    Nose: nasal turbinates moist, *** nasal discharge    Throat: moist mucus membranes, no erythema, *** tonsillar exudate.  Airway is patent Cardio: regular rate and rhythm, S1S2 heard, no murmurs appreciated Pulm: clear to auscultation bilaterally, no wheezes, rhonchi or rales; normal work of breathing on room air GI: soft, non-tender, non-distended, bowel sounds present x4, no hepatomegaly, no splenomegaly, no masses GU: external vaginal tissue ***, cervix ***, *** punctate lesions on cervix appreciated, *** discharge from cervical os, *** bleeding, *** cervical motion tenderness, *** abdominal/ adnexal masses Extremities: warm, well perfused, No edema, cyanosis or clubbing; +*** pulses bilaterally MSK: *** gait and *** station Skin: dry; intact; no rashes or lesions Neuro:  *** Strength and light touch sensation grossly intact, *** DTRs ***/4  Assessment/ Plan: 72 y.o. female   ***  No orders of the defined types were placed in this encounter.  No orders of the defined types were placed in this encounter.    [name], DO [place] [phone]

## 2022-12-12 NOTE — Patient Instructions (Addendum)
Voltaren gel applied 2-3 times daily to that toe joint for pain.  Coronary Calcium Scan A coronary calcium scan is an imaging test used to look for deposits of plaque in the inner lining of the blood vessels of the heart (coronary arteries). Plaque is made up of calcium, protein, and fatty substances. These deposits of plaque can partly clog and narrow the coronary arteries without producing any symptoms or warning signs. This puts a person at risk for a heart attack. A coronary calcium scan is performed using a computed tomography (CT) scanner machine without using a dye (contrast). This test is recommended for people who are at moderate risk for heart disease. The test can find plaque deposits before symptoms develop. Tell a health care provider about: Any allergies you have. All medicines you are taking, including vitamins, herbs, eye drops, creams, and over-the-counter medicines. Any problems you or family members have had with anesthetic medicines. Any bleeding problems you have. Any surgeries you have had. Any medical conditions you have. Whether you are pregnant or may be pregnant. What are the risks? Generally, this is a safe procedure. However, problems may occur, including: Harm to a pregnant woman and her unborn baby. This test involves the use of radiation. Radiation exposure can be dangerous to a pregnant woman and her unborn baby. If you are pregnant or think you may be pregnant, you should not have this procedure done. A slight increase in the risk of cancer. This is because of the radiation involved in the test. The amount of radiation from one test is similar to the amount of radiation you are naturally exposed to over one year. What happens before the procedure? Ask your health care provider for any specific instructions on how to prepare for this procedure. You may be asked to avoid products that contain caffeine, tobacco, or nicotine for 4 hours before the procedure. What  happens during the procedure?  You will undress and remove any jewelry from your neck or chest. You may need to remove hearing aides and dentures. Women may need to remove their bras. You will put on a hospital gown. Sticky electrodes will be placed on your chest. The electrodes will be connected to an electrocardiogram (ECG) machine to record a tracing of the electrical activity of your heart. You will lie down on your back on a curved bed that is attached to the [place]. You may be given medicine to slow down your heart rate so that clear pictures can be created. You will be moved into the CT scanner, and the CT scanner will take pictures of your heart. During this time, you will be asked to lie still and hold your breath for 10-20 seconds at a time while each picture of your heart is being taken. The procedure may vary among health care providers and hospitals. What can I expect after the procedure? You can return to your normal activities. It is up to you to get the results of your procedure. Ask your health care provider, or the department that is doing the procedure, when your results will be ready. Summary A coronary calcium scan is an imaging test used to look for deposits of plaque in the inner lining of the blood vessels of the heart. Plaque is made up of calcium, protein, and fatty substances. A coronary calcium scan is performed using a CT scanner machine without contrast. Generally, this is a safe procedure. Tell your health care provider if you are pregnant or may be  pregnant. Ask your health care provider for any specific instructions on how to prepare for this procedure. You can return to your normal activities after the scan is done. This information is not intended to replace advice given to you by your health care provider. Make sure you discuss any questions you have with your health care provider. Document Revised: 08/14/2021 Document Reviewed: 08/14/2021 Elsevier Patient  Education  [place].   Prediabetes Eating Plan Prediabetes is a condition that causes blood sugar (glucose) levels to be higher than normal. This increases the risk for developing type 2 diabetes (type 2 diabetes mellitus). Working with a health care provider or nutrition specialist (dietitian) to make diet and lifestyle changes can help prevent the onset of diabetes. These changes may help you: Control your blood glucose levels. Improve your cholesterol levels. Manage your blood pressure. What are tips for following this plan? Reading food labels Read food labels to check the amount of fat, salt (sodium), and sugar in prepackaged foods. Avoid foods that have: Saturated fats. Trans fats. Added sugars. Avoid foods that have more than 300 milligrams (mg) of sodium per serving. Limit your sodium intake to less than 2,300 mg each day. Shopping Avoid buying pre-made and processed foods. Avoid buying drinks with added sugar. Cooking Cook with olive oil. Do not use butter, lard, or ghee. Bake, broil, grill, steam, or boil foods. Avoid frying. Meal planning  Work with your dietitian to create an eating plan that is right for you. This may include tracking how many calories you take in each day. Use a food diary, notebook, or mobile application to track what you eat at each meal. Consider following a Mediterranean diet. This includes: Eating several servings of fresh fruits and vegetables each day. Eating fish at least twice a week. Eating one serving each day of whole grains, beans, nuts, and seeds. Using olive oil instead of other fats. Limiting alcohol. Limiting red meat. Using nonfat or low-fat dairy products. Consider following a plant-based diet. This includes dietary choices that focus on eating mostly vegetables and fruit, grains, beans, nuts, and seeds. If you have high blood pressure, you may need to limit your sodium intake or follow a diet such as the DASH (Dietary  Approaches to Stop Hypertension) eating plan. The DASH diet aims to lower high blood pressure. Lifestyle Set weight loss goals with help from your health care team. It is recommended that most people with prediabetes lose 7% of their body weight. Exercise for at least 30 minutes 5 or more days a week. Attend a support group or seek support from a mental health counselor. Take over-the-counter and prescription medicines only as told by your health care provider. What foods are recommended? Fruits Berries. Bananas. Apples. Oranges. Grapes. Papaya. Mango. Pomegranate. Kiwi. Grapefruit. Cherries. Vegetables Lettuce. Spinach. Peas. Beets. Cauliflower. Cabbage. Broccoli. Carrots. Tomatoes. Squash. Eggplant. Herbs. Peppers. Onions. Cucumbers. Brussels sprouts. Grains Whole grains, such as whole-wheat or whole-grain breads, crackers, cereals, and pasta. Unsweetened oatmeal. Bulgur. Barley. Quinoa. Brown rice. Corn or whole-wheat flour tortillas or taco shells. Meats and other proteins Seafood. Poultry without skin. Lean cuts of pork and beef. Tofu. Eggs. Nuts. Beans. Dairy Low-fat or fat-free dairy products, such as yogurt, cottage cheese, and cheese. Beverages Water. Tea. Coffee. Sugar-free or diet soda. Seltzer water. Low-fat or nonfat milk. Milk alternatives, such as soy or almond milk. Fats and oils Olive oil. Canola oil. Sunflower oil. Grapeseed oil. Avocado. Walnuts. Sweets and desserts Sugar-free or low-fat pudding. Sugar-free or  low-fat ice cream and other frozen treats. Seasonings and condiments Herbs. Sodium-free spices. Mustard. Relish. Low-salt, low-sugar ketchup. Low-salt, low-sugar barbecue sauce. Low-fat or fat-free mayonnaise. The items listed above may not be a complete list of recommended foods and beverages. Contact a dietitian for more information. What foods are not recommended? Fruits Fruits canned with syrup. Vegetables Canned vegetables. Frozen vegetables with butter or  cream sauce. Grains Refined white flour and flour products, such as bread, pasta, snack foods, and cereals. Meats and other proteins Fatty cuts of meat. Poultry with skin. Breaded or fried meat. Processed meats. Dairy Full-fat yogurt, cheese, or milk. Beverages Sweetened drinks, such as iced tea and soda. Fats and oils Butter. Lard. Ghee. Sweets and desserts Baked goods, such as cake, cupcakes, pastries, cookies, and cheesecake. Seasonings and condiments Spice mixes with added salt. Ketchup. Barbecue sauce. Mayonnaise. The items listed above may not be a complete list of foods and beverages that are not recommended. Contact a dietitian for more information. Where to find more information American Diabetes Association: www.diabetes.org Summary You may need to make diet and lifestyle changes to help prevent the onset of diabetes. These changes can help you control blood sugar, improve cholesterol levels, and manage blood pressure. Set weight loss goals with help from your health care team. It is recommended that most people with prediabetes lose 7% of their body weight. Consider following a Mediterranean diet. This includes eating plenty of fresh fruits and vegetables, whole grains, beans, nuts, seeds, fish, and low-fat dairy, and using olive oil instead of other fats. This information is not intended to replace advice given to you by your health care provider. Make sure you discuss any questions you have with your health care provider. Document Revised: 12/04/2019 Document Reviewed: 12/04/2019 Elsevier Patient Education  [place].

## 2022-12-13 LAB — SPECIMEN STATUS REPORT

## 2022-12-13 LAB — HGB A1C W/O EAG: Hgb A1c MFr Bld: 5.7 % — ABNORMAL HIGH (ref 4.8–5.6)

## 2023-01-17 ENCOUNTER — Other Ambulatory Visit (HOSPITAL_BASED_OUTPATIENT_CLINIC_OR_DEPARTMENT_OTHER): Payer: Medicare HMO

## 2023-01-22 ENCOUNTER — Other Ambulatory Visit (HOSPITAL_COMMUNITY): Payer: Self-pay

## 2023-02-14 ENCOUNTER — Ambulatory Visit (HOSPITAL_BASED_OUTPATIENT_CLINIC_OR_DEPARTMENT_OTHER)
Admission: RE | Admit: 2023-02-14 | Discharge: 2023-02-14 | Disposition: A | Discharge status: Home / Self Care | Payer: Medicare HMO | Source: Ambulatory Visit | Attending: Family Medicine | Admitting: Family Medicine

## 2023-02-14 DIAGNOSIS — E782 Mixed hyperlipidemia: Secondary | ICD-10-CM | POA: Insufficient documentation

## 2023-02-14 DIAGNOSIS — R7303 Prediabetes: Secondary | ICD-10-CM | POA: Insufficient documentation

## 2023-04-02 ENCOUNTER — Telehealth: Payer: Self-pay | Admitting: Family Medicine

## 2023-04-02 NOTE — Telephone Encounter (Signed)
This CT scan does visualize some of the lungs and they were clear.

## 2023-04-02 NOTE — Telephone Encounter (Signed)
Pt has been notified.

## 2023-04-18 ENCOUNTER — Telehealth: Payer: Self-pay | Admitting: Family Medicine

## 2023-04-18 NOTE — Telephone Encounter (Signed)
I don't see any labs that need to be ordered but an A1C which will be drawn day of appt. LMTCB

## 2023-05-28 DIAGNOSIS — X32XXXD Exposure to sunlight, subsequent encounter: Secondary | ICD-10-CM | POA: Diagnosis not present

## 2023-05-28 DIAGNOSIS — L905 Scar conditions and fibrosis of skin: Secondary | ICD-10-CM | POA: Diagnosis not present

## 2023-05-28 DIAGNOSIS — L570 Actinic keratosis: Secondary | ICD-10-CM | POA: Diagnosis not present

## 2023-06-05 ENCOUNTER — Other Ambulatory Visit: Payer: Self-pay | Admitting: Obstetrics & Gynecology

## 2023-06-05 DIAGNOSIS — Z0000 Encounter for general adult medical examination without abnormal findings: Secondary | ICD-10-CM

## 2023-06-14 ENCOUNTER — Other Ambulatory Visit: Payer: Medicare HMO

## 2023-06-14 DIAGNOSIS — E7800 Pure hypercholesterolemia, unspecified: Secondary | ICD-10-CM

## 2023-06-14 DIAGNOSIS — R7303 Prediabetes: Secondary | ICD-10-CM

## 2023-06-14 LAB — LIPID PANEL
Chol/HDL Ratio: 4.2 ratio (ref 0.0–4.4)
Cholesterol, Total: 226 mg/dL — ABNORMAL HIGH (ref 100–199)
HDL: 54 mg/dL (ref >39)
LDL Chol Calc (NIH): 147 mg/dL — ABNORMAL HIGH (ref 0–99)
Triglycerides: 137 mg/dL (ref 0–149)
VLDL Cholesterol Cal: 25 mg/dL (ref 5–40)

## 2023-06-14 LAB — BAYER DCA HB A1C WAIVED: HB A1C (BAYER DCA - WAIVED): 5.5 % (ref 4.8–5.6)

## 2023-06-18 NOTE — Progress Notes (Unsigned)
Subjective: CC:*** PCP: Raliegh Ip, DO WUJ:[id number] Krista Henderson is a 72 y.o. female presenting to clinic today for:  1. ***   ROS: Per HPI  Allergies  Allergen Reactions   Codeine     REACTION: rash/nausea/vomiting   Influenza Vaccines     "injections site turned black color and had a hen size egg lump at site". Per pt she said not sure if the reactions was from a spider bite or flu shot.     Sulfa Antibiotics Nausea And Vomiting   Past Medical History:  Diagnosis Date   Abnormal Pap smear of cervix    Arthritis    toe , finger, both hips   Atrial fibrillation ([facility])    [place]   Blood transfusion without reported diagnosis    Bone spur 09/2017   Left hip   Endometriosis    Hx of adenomatous colonic polyps    Hx of removal of cyst    right ear   Hyperlipidemia    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   WBC 3,000 08/30/11 52 P 43 L   Migraine without aura    pas thx   Vitamin D deficiency     Current Outpatient Medications:    Calcium Citrate (CITRACAL PO), Take 1 capsule by mouth daily., Disp: , Rfl:    Calcium Magnesium Zinc 333-133-5 MG TABS, Take by mouth., Disp: , Rfl:    multivitamin-lutein (OCUVITE-LUTEIN) CAPS capsule, Take 1 capsule by mouth daily., Disp: , Rfl:    Probiotic Product (PROBIOTIC DAILY PO), Take 1 capsule by mouth daily. Flogen, Disp: , Rfl:    Vitamin D, Ergocalciferol, (DRISDOL) 1.25 MG (50000 UNIT) CAPS capsule, Take 1 capsule (50,000 Units total) by mouth every 7 (seven) days., Disp: 12 capsule, Rfl: 4 Social History   Socioeconomic History   Marital status: Married    Spouse name: Loraine Leriche   Number of children: Not on file   Years of education: Not on file   Highest education level: Not on file  Occupational History   Not on file  Tobacco Use   Smoking status: Never   Smokeless tobacco: Never  Vaping Use   Vaping status: Never Used  Substance and Sexual Activity   Alcohol use: No   Drug  use: No   Sexual activity: Yes    Partners: Male    Birth control/protection: Surgical    Comment: Hysterectomy  Other Topics Concern   Not on file  Social History Narrative   Married to [place].  Her daughter is expecting a child in the next couple of days.  They will have a total of 2 grandchildren, both girls.   Social Determinants of Health   Financial Resource Strain: Not on file  Food Insecurity: Not on file  Transportation Needs: Not on file  Physical Activity: Not on file  Stress: Not on file  Social Connections: Not on file  Intimate Partner Violence: Not on file   Family History  Problem Relation Age of Onset   Diabetes Mother    Osteoporosis Mother    Hypertension Mother    Colon cancer Father 78   Colon polyps Father    Stomach cancer Paternal Caprice Renshaw' disease Daughter    Breast cancer Paternal Aunt        44's   Cancer Paternal Aunt        breast   Esophageal cancer Neg Hx    Rectal cancer  Neg Hx     Objective: Office vital signs reviewed. LMP 08/18/1989   Physical Examination:  General: Awake, alert, *** nourished, No acute distress HEENT: Normal    Neck: No masses palpated. No lymphadenopathy    Ears: Tympanic membranes intact, normal light reflex, no erythema, no bulging    Eyes: PERRLA, extraocular membranes intact, sclera ***    Nose: nasal turbinates moist, *** nasal discharge    Throat: moist mucus membranes, no erythema, *** tonsillar exudate.  Airway is patent Cardio: regular rate and rhythm, S1S2 heard, no murmurs appreciated Pulm: clear to auscultation bilaterally, no wheezes, rhonchi or rales; normal work of breathing on room air GI: soft, non-tender, non-distended, bowel sounds present x4, no hepatomegaly, no splenomegaly, no masses GU: external vaginal tissue ***, cervix ***, *** punctate lesions on cervix appreciated, *** discharge from cervical os, *** bleeding, *** cervical motion tenderness, *** abdominal/ adnexal  masses Extremities: warm, well perfused, No edema, cyanosis or clubbing; +*** pulses bilaterally MSK: *** gait and *** station Skin: dry; intact; no rashes or lesions Neuro: *** Strength and light touch sensation grossly intact, *** DTRs ***/4  The 10-year ASCVD risk score (Arnett DK, et al., 2019) is: 10.7%   Values used to calculate the score:     Age: 46 years     Sex: Female     Is Non-Hispanic African American: No     Diabetic: No     Tobacco smoker: No     Systolic Blood Pressure: 127 mmHg     Is BP treated: No     HDL Cholesterol: 54 mg/dL     Total Cholesterol: 226 mg/dL  Assessment/ Plan: 72 y.o. female   Prediabetes  Pure hypercholesterolemia  ***  HLD not at goal but CT coronary artery calcium scoring was 0 despite ASCVD risk of 10.7%   Krista Henderson M Synia Douglass, DO [facility] [phone]

## 2023-06-19 ENCOUNTER — Ambulatory Visit (INDEPENDENT_AMBULATORY_CARE_PROVIDER_SITE_OTHER): Payer: Medicare HMO | Admitting: Family Medicine

## 2023-06-19 ENCOUNTER — Encounter: Payer: Self-pay | Admitting: Family Medicine

## 2023-06-19 VITALS — BP 124/72 | HR 71 | Temp 98.4°F | Ht 64.0 in | Wt 158.0 lb

## 2023-06-19 DIAGNOSIS — I480 Paroxysmal atrial fibrillation: Secondary | ICD-10-CM | POA: Diagnosis not present

## 2023-06-19 DIAGNOSIS — E7800 Pure hypercholesterolemia, unspecified: Secondary | ICD-10-CM | POA: Diagnosis not present

## 2023-06-19 DIAGNOSIS — E559 Vitamin D deficiency, unspecified: Secondary | ICD-10-CM

## 2023-06-19 DIAGNOSIS — R7303 Prediabetes: Secondary | ICD-10-CM | POA: Diagnosis not present

## 2023-07-30 ENCOUNTER — Ambulatory Visit (INDEPENDENT_AMBULATORY_CARE_PROVIDER_SITE_OTHER): Payer: Medicare HMO

## 2023-07-30 DIAGNOSIS — Z23 Encounter for immunization: Secondary | ICD-10-CM

## 2023-08-06 ENCOUNTER — Ambulatory Visit: Payer: Medicare HMO

## 2023-09-06 NOTE — Progress Notes (Addendum)
72 y.o. G73P2002 Married White or Caucasian female here for breast and pelvic exam.  I am also following her for PMP status.  Denies vaginal bleeding.   Had a coronary calcium test done 02/2023.  This was good.   Her cholesterol is elevated but is not on cholesterol lower medication at this time.    Denies vaginal bleeding.  Does have some dryness with intercourse.  Using coconut oil.    Patient's last menstrual period was 08/18/1989.          Sexually active: Yes.    H/O STD:  yes  Health Maintenance: PCP:  Dr. Nadine Counts.  Last wellness appt was 06/2023.  Did blood work at that appt:  yes Vaccines are up to date:  everything up to date Colonoscopy:  04/18/2022 MMG:  10/11/2022 Negative BMD:  11/23/2021, -2.1 Last pap smear:  not indicated.  TAH/BSO 1990.      reports that she has never smoked. She has never used smokeless tobacco. She reports that she does not drink alcohol and does not use drugs.  Past Medical History:  Diagnosis Date   Abnormal Pap smear of cervix    Arthritis    toe , finger, both hips   Atrial fibrillation (HCC)    [place]   Blood transfusion without reported diagnosis    Bone spur 09/2017   Left hip   Endometriosis    Hx of adenomatous colonic polyps    Hx of removal of cyst    right ear   Hyperlipidemia    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   WBC 3,000 08/30/11 52 P 43 L   Migraine without aura    pas thx   Vitamin D deficiency     Past Surgical History:  Procedure Laterality Date   APPENDECTOMY  1965   7th grade in the '60's   CESAREAN SECTION     x2 '84 & '85   DIAGNOSTIC LAPAROSCOPY     TOTAL ABDOMINAL HYSTERECTOMY W/ BILATERAL SALPINGOOPHORECTOMY  08/1989   endometriosis    Current Outpatient Medications  Medication Sig Dispense Refill   Calcium Citrate (CITRACAL PO) Take 1 capsule by mouth daily.     Calcium Magnesium Zinc 333-133-5 MG TABS Take by mouth.     multivitamin-lutein  (OCUVITE-LUTEIN) CAPS capsule Take 1 capsule by mouth daily.     Probiotic Product (PROBIOTIC DAILY PO) Take 1 capsule by mouth daily. Flogen     Vitamin D, Ergocalciferol, (DRISDOL) 1.25 MG (50000 UNIT) CAPS capsule Take 1 capsule (50,000 Units total) by mouth every 7 (seven) days. 12 capsule 4   No current facility-administered medications for this visit.    Family History  Problem Relation Age of Onset   Diabetes Mother    Osteoporosis Mother    Hypertension Mother    Colon cancer Father 75   Colon polyps Father    Stomach cancer Paternal Caprice Renshaw' disease Daughter    Breast cancer Paternal Aunt        13's   Cancer Paternal Aunt        breast   Esophageal cancer Neg Hx    Rectal cancer Neg Hx     Review of Systems  Constitutional: Negative.   Genitourinary: Negative.     Exam:   BP (!) 148/88 (BP Location: Right Arm, Patient Position: Sitting, Cuff Size: Large)   Pulse 71   Ht 5\' 4"  (1.626 m)   Wt 160  lb 3.2 oz (72.7 kg)   LMP 08/18/1989   BMI 27.50 kg/m   Height: 5\' 4"  (162.6 cm)  General appearance: alert, cooperative and appears stated age Breasts: normal appearance, no masses or tenderness Abdomen: soft, non-tender; bowel sounds normal; no masses,  no organomegaly Lymph nodes: Cervical, supraclavicular, and axillary nodes normal.  No abnormal inguinal nodes palpated Neurologic: Grossly normal  Pelvic: External genitalia:  no lesions              Urethra:  normal appearing urethra with no masses, tenderness or lesions              Bartholins and Skenes: normal                 Vagina: atrophic tissue, well healed cuff              Cervix: absent              Pap taken: No. Bimanual Exam:  Uterus:  uterus absent              Adnexa: no mass, fullness, tenderness               Rectovaginal: Confirms               Anus:  normal sphincter tone, no lesions  Chaperone, Ina Homes, CMA, was present for exam.  Assessment/Plan: 1. Encntr for gyn  exam (general) (routine) w/o abn findings (Primary) - Pap smear not indicated - Mammogram 10/11/2022 - Colonoscopy 04/19/2023 - Bone mineral density 11/23/2021, -2.1 hips - lab work done with PCP, Dr. Nadine Counts - vaccines reviewed/updated  2. H/O: hysterectomy - done 35 with BSO  3. Osteopenia, unspecified location - will repeat 1-2 years.  Taking Vit D and calcium.  Dosing reviewed.  She is taking 800mg  daily.  She does not get much dairy so 1200mg  recommenced.    4. Vitamin D deficiency - also on prescription Vit D currently written by Dr. Nadine Counts

## 2023-09-10 ENCOUNTER — Ambulatory Visit (HOSPITAL_BASED_OUTPATIENT_CLINIC_OR_DEPARTMENT_OTHER): Payer: Medicare HMO | Admitting: Obstetrics & Gynecology

## 2023-09-10 ENCOUNTER — Encounter (HOSPITAL_BASED_OUTPATIENT_CLINIC_OR_DEPARTMENT_OTHER): Payer: Self-pay | Admitting: Obstetrics & Gynecology

## 2023-09-10 VITALS — BP 148/88 | HR 71 | Ht 64.0 in | Wt 160.2 lb

## 2023-09-10 DIAGNOSIS — Z01419 Encounter for gynecological examination (general) (routine) without abnormal findings: Secondary | ICD-10-CM

## 2023-09-10 DIAGNOSIS — Z90710 Acquired absence of both cervix and uterus: Secondary | ICD-10-CM | POA: Diagnosis not present

## 2023-09-10 DIAGNOSIS — E559 Vitamin D deficiency, unspecified: Secondary | ICD-10-CM

## 2023-09-10 DIAGNOSIS — M8580 Other specified disorders of bone density and structure, unspecified site: Secondary | ICD-10-CM

## 2023-10-11 ENCOUNTER — Other Ambulatory Visit: Payer: Self-pay | Admitting: Family Medicine

## 2023-10-11 DIAGNOSIS — E559 Vitamin D deficiency, unspecified: Secondary | ICD-10-CM

## 2023-10-15 ENCOUNTER — Ambulatory Visit
Admission: RE | Admit: 2023-10-15 | Discharge: 2023-10-15 | Disposition: A | Discharge status: Home / Self Care | Payer: Medicare HMO | Source: Ambulatory Visit | Attending: Obstetrics & Gynecology | Admitting: Obstetrics & Gynecology

## 2023-10-15 DIAGNOSIS — Z1231 Encounter for screening mammogram for malignant neoplasm of breast: Secondary | ICD-10-CM | POA: Diagnosis not present

## 2023-10-15 DIAGNOSIS — Z0000 Encounter for general adult medical examination without abnormal findings: Secondary | ICD-10-CM

## 2023-11-02 ENCOUNTER — Other Ambulatory Visit: Payer: Self-pay | Admitting: Family Medicine

## 2023-11-02 DIAGNOSIS — E559 Vitamin D deficiency, unspecified: Secondary | ICD-10-CM

## 2023-11-02 MED ORDER — VITAMIN D (ERGOCALCIFEROL) 1.25 MG (50000 UNIT) PO CAPS: 50000.0000 [IU] | ORAL_CAPSULE | ORAL | 4 refills | Status: DC

## 2023-11-02 NOTE — Telephone Encounter (Signed)
Copied from CRM [id number]. Topic: Clinical - Medication Refill >> Nov 02, 2023  1:01 PM Gildardo Pounds wrote: Most Recent Primary Care Visit:  Provider: [facility]  Department: [facility]  Visit Type: FLU SHOT  Date: 07/30/2023  Medication: Vitamin D, Ergocalciferol, (DRISDOL) 1.25 MG (50000 UNIT) CAPS capsule   Has the patient contacted their pharmacy? Yes (Agent: If no, request that the patient contact the pharmacy for the refill. If patient does not wish to contact the pharmacy document the reason why and proceed with request.) (Agent: If yes, when and what did the pharmacy advise?)  Is this the correct pharmacy for this prescription? Yes If no, delete pharmacy and type the correct one.  This is the patient's preferred pharmacy:  [id number] - [place], [place] - [address] [place] [address] Phone: [phone] Fax: [phone]   Has the prescription been filled recently? No  Is the patient out of the medication? Yes  Has the patient been seen for an appointment in the last year OR does the patient have an upcoming appointment? Yes  Can we respond through MyChart? Yes  Agent: Please be advised that Rx refills may take up to 3 business days. We ask that you follow-up with your pharmacy.

## 2023-11-29 DIAGNOSIS — H5213 Myopia, bilateral: Secondary | ICD-10-CM | POA: Diagnosis not present

## 2023-11-29 DIAGNOSIS — H2513 Age-related nuclear cataract, bilateral: Secondary | ICD-10-CM | POA: Diagnosis not present

## 2023-12-13 ENCOUNTER — Other Ambulatory Visit: Payer: Medicare HMO

## 2023-12-13 DIAGNOSIS — E559 Vitamin D deficiency, unspecified: Secondary | ICD-10-CM | POA: Diagnosis not present

## 2023-12-13 DIAGNOSIS — R7303 Prediabetes: Secondary | ICD-10-CM | POA: Diagnosis not present

## 2023-12-13 DIAGNOSIS — I480 Paroxysmal atrial fibrillation: Secondary | ICD-10-CM

## 2023-12-13 DIAGNOSIS — E7800 Pure hypercholesterolemia, unspecified: Secondary | ICD-10-CM

## 2023-12-13 LAB — BAYER DCA HB A1C WAIVED: HB A1C (BAYER DCA - WAIVED): 5.9 % — ABNORMAL HIGH (ref 4.8–5.6)

## 2023-12-13 LAB — LIPID PANEL

## 2023-12-14 ENCOUNTER — Encounter: Payer: Self-pay | Admitting: Family Medicine

## 2023-12-14 LAB — CBC WITH DIFFERENTIAL/PLATELET
Basophils Absolute: 0 10*3/uL (ref 0.0–0.2)
Basos: 1 %
EOS (ABSOLUTE): 0.1 10*3/uL (ref 0.0–0.4)
Eos: 1 %
Hematocrit: 37.8 % (ref 34.0–46.6)
Hemoglobin: 13 g/dL (ref 11.1–15.9)
Immature Grans (Abs): 0 10*3/uL (ref 0.0–0.1)
Immature Granulocytes: 0 %
Lymphocytes Absolute: 1.4 10*3/uL (ref 0.7–3.1)
Lymphs: 41 %
MCH: 32.5 pg (ref 26.6–33.0)
MCHC: 34.4 g/dL (ref 31.5–35.7)
MCV: 95 fL (ref 79–97)
Monocytes Absolute: 0.3 10*3/uL (ref 0.1–0.9)
Monocytes: 9 %
Neutrophils Absolute: 1.7 10*3/uL (ref 1.4–7.0)
Neutrophils: 48 %
Platelets: 183 10*3/uL (ref 150–450)
RBC: 4 x10E6/uL (ref 3.77–5.28)
RDW: 11.5 % — ABNORMAL LOW (ref 11.7–15.4)
WBC: 3.5 10*3/uL (ref 3.4–10.8)

## 2023-12-14 LAB — LIPID PANEL
Cholesterol, Total: 223 mg/dL — ABNORMAL HIGH (ref 100–199)
HDL: 61 mg/dL (ref >39)
LDL CALC COMMENT:: 3.7 ratio (ref 0.0–4.4)
LDL Chol Calc (NIH): 140 mg/dL — ABNORMAL HIGH (ref 0–99)
Triglycerides: 126 mg/dL (ref 0–149)
VLDL Cholesterol Cal: 22 mg/dL (ref 5–40)

## 2023-12-14 LAB — CMP14+EGFR
ALT: 12 IU/L (ref 0–32)
AST: 20 IU/L (ref 0–40)
Albumin: 4.5 g/dL (ref 3.8–4.8)
Alkaline Phosphatase: 87 IU/L (ref 44–121)
BUN/Creatinine Ratio: 16 (ref 12–28)
BUN: 14 mg/dL (ref 8–27)
Bilirubin Total: 1.2 mg/dL (ref 0.0–1.2)
CO2: 20 mmol/L (ref 20–29)
Calcium: 9.4 mg/dL (ref 8.7–10.3)
Chloride: 104 mmol/L (ref 96–106)
Creatinine, Ser: 0.86 mg/dL (ref 0.57–1.00)
Globulin, Total: 2.1 g/dL (ref 1.5–4.5)
Glucose: 116 mg/dL — ABNORMAL HIGH (ref 70–99)
Potassium: 3.9 mmol/L (ref 3.5–5.2)
Sodium: 140 mmol/L (ref 134–144)
Total Protein: 6.6 g/dL (ref 6.0–8.5)
eGFR: 72 mL/min/{1.73_m2} (ref >59)

## 2023-12-14 LAB — VITAMIN D 25 HYDROXY (VIT D DEFICIENCY, FRACTURES): Vit D, 25-Hydroxy: 33.3 ng/mL (ref 30.0–100.0)

## 2023-12-14 LAB — TSH: TSH: 3.36 u[IU]/mL (ref 0.450–4.500)

## 2023-12-14 LAB — MAGNESIUM: Magnesium: 2 mg/dL (ref 1.6–2.3)

## 2023-12-18 ENCOUNTER — Encounter: Payer: Self-pay | Admitting: Family Medicine

## 2023-12-18 ENCOUNTER — Ambulatory Visit: Payer: Medicare HMO | Admitting: Family Medicine

## 2023-12-18 ENCOUNTER — Ambulatory Visit (INDEPENDENT_AMBULATORY_CARE_PROVIDER_SITE_OTHER)

## 2023-12-18 ENCOUNTER — Other Ambulatory Visit: Payer: Self-pay | Admitting: Family Medicine

## 2023-12-18 VITALS — BP 126/76 | HR 83 | Temp 97.9°F | Ht 64.0 in | Wt 159.2 lb

## 2023-12-18 DIAGNOSIS — Z0001 Encounter for general adult medical examination with abnormal findings: Secondary | ICD-10-CM

## 2023-12-18 DIAGNOSIS — B070 Plantar wart: Secondary | ICD-10-CM | POA: Diagnosis not present

## 2023-12-18 DIAGNOSIS — Z0000 Encounter for general adult medical examination without abnormal findings: Secondary | ICD-10-CM

## 2023-12-18 DIAGNOSIS — R7303 Prediabetes: Secondary | ICD-10-CM

## 2023-12-18 DIAGNOSIS — M85851 Other specified disorders of bone density and structure, right thigh: Secondary | ICD-10-CM

## 2023-12-18 DIAGNOSIS — Z780 Asymptomatic menopausal state: Secondary | ICD-10-CM

## 2023-12-18 DIAGNOSIS — E782 Mixed hyperlipidemia: Secondary | ICD-10-CM | POA: Diagnosis not present

## 2023-12-18 DIAGNOSIS — L309 Dermatitis, unspecified: Secondary | ICD-10-CM

## 2023-12-18 DIAGNOSIS — E559 Vitamin D deficiency, unspecified: Secondary | ICD-10-CM | POA: Diagnosis not present

## 2023-12-18 MED ORDER — VITAMIN D (ERGOCALCIFEROL) 1.25 MG (50000 UNIT) PO CAPS: 50000.0000 [IU] | ORAL_CAPSULE | ORAL | 4 refills | Status: AC

## 2023-12-18 MED ORDER — TRIAMCINOLONE ACETONIDE 0.1 % EX CREA: 1.0000 | TOPICAL_CREAM | Freq: Two times a day (BID) | CUTANEOUS | 0 refills | Status: DC | PRN

## 2023-12-18 NOTE — Progress Notes (Addendum)
 Krista Henderson is a 73 y.o. female presents to office today for annual physical exam examination.    Concerns today include: 1.  She reports a little spot in the left ear that she would like me to look at.  It has been a little irritated and she is not sure if maybe her new hair product caused or if she has something else going on.  She has a history of cyst on the right ear that required resection previously  She admits that she really has not been abiding by strict diet because she is been taking care of grandbabies and the prematures eat what ever sugary things around.  She is hoping to get control back over her health and hopes to start exercising a little more soon.  She does not enjoy dairy so is not sure that she is getting adequate vitamin D and calcium intake.  She does take weekly vitamin D as prescribed  Occupation: Retired Substance use: None Health Maintenance Due  Topic Date Due   DEXA SCAN  11/25/2023   Refills needed today: None  Immunization History  Administered Date(s) Administered   Influenza Inj Mdck Quad Pf 06/24/2019, 08/03/2020, 07/19/2021, 07/24/2022   Influenza, Mdck, Trivalent,PF 6+ MOS(egg free) 07/30/2023   Influenza, Quadrivalent, Recombinant, Inj, Pf 07/31/2016, 09/25/2017, 08/08/2018   Influenza,inj,Quad PF,6+ Mos 07/15/2014   Moderna Covid-19 Vaccine Bivalent Booster 41yrs & up 08/29/2021   Moderna Sars-Covid-2 Vaccination 11/08/2019, 12/06/2019, 08/17/2020   Pneumococcal Conjugate-13 12/27/2016   Pneumococcal Polysaccharide-23 08/08/2018   Tdap 05/31/2011, 09/06/2021   Zoster Recombinant(Shingrix) 11/23/2021, 03/13/2022   Zoster, Live 10/17/2011   Past Medical History:  Diagnosis Date   Abnormal Pap smear of cervix    Arthritis    toe , finger, both hips   Atrial fibrillation (HCC)    [place]- no further episodes   Blood transfusion without reported diagnosis    Bone spur 09/2017   Left hip   Endometriosis     Hx of adenomatous colonic polyps    Hx of removal of cyst    right ear   Hyperlipidemia    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   WBC 3,000 08/30/11 52 P 43 L   Migraine without aura    pas thx   Vitamin D deficiency    Social History   Socioeconomic History   Marital status: Married    Spouse name: Merchant navy officer   Number of children: Not on file   Years of education: Not on file   Highest education level: Associate degree: occupational, Scientist, product/process development, or vocational program  Occupational History   Not on file  Tobacco Use   Smoking status: Never   Smokeless tobacco: Never  Vaping Use   Vaping status: Never Used  Substance and Sexual Activity   Alcohol use: No   Drug use: No   Sexual activity: Yes    Partners: Male    Birth control/protection: Surgical    Comment: Hysterectomy  Other Topics Concern   Not on file  Social History Narrative   Married to [place].  Her daughter is expecting a child in the next couple of days.  They will have a total of 2 grandchildren, both girls.   Social Drivers of Corporate investment banker Strain: Low Risk  (12/17/2023)   Overall Financial Resource Strain (CARDIA)    Difficulty of Paying Living Expenses: Not hard at all  Food Insecurity: No Food Insecurity (12/17/2023)   Hunger Vital Sign  Worried About Programme researcher, broadcasting/film/video in the Last Year: Never true    Ran Out of Food in the Last Year: Never true  Transportation Needs: No Transportation Needs (12/17/2023)   PRAPARE - Administrator, Civil Service (Medical): No    Lack of Transportation (Non-Medical): No  Physical Activity: Sufficiently Active (12/17/2023)   Exercise Vital Sign    Days of Exercise per Week: 4 days    Minutes of Exercise per Session: 50 min  Stress: No Stress Concern Present (12/17/2023)   [employer] of Occupational Health - Occupational Stress Questionnaire    Feeling of Stress : Not at all  Social Connections: Unknown (12/17/2023)   Social Connection and Isolation Panel  [NHANES]    Frequency of Communication with Friends and Family: More than three times a week    Frequency of Social Gatherings with Friends and Family: Not on file    Attends Religious Services: More than 4 times per year    Active Member of [employer] or Organizations: Not on file    Attends Banker Meetings: Not on file    Marital Status: Married  Intimate Partner Violence: Not on file   Past Surgical History:  Procedure Laterality Date   APPENDECTOMY  1965   7th grade in the '60's   CESAREAN SECTION     x2 '84 & '85   DIAGNOSTIC LAPAROSCOPY     TOTAL ABDOMINAL HYSTERECTOMY W/ BILATERAL SALPINGOOPHORECTOMY  08/1989   endometriosis   Family History  Problem Relation Age of Onset   Diabetes Mother    Osteoporosis Mother    Hypertension Mother    Colon cancer Father 32   Colon polyps Father    Stomach cancer Paternal Caprice Renshaw' disease Daughter    Breast cancer Paternal Aunt        57's   Cancer Paternal Aunt        breast   Esophageal cancer Neg Hx    Rectal cancer Neg Hx     Current Outpatient Medications:    Calcium Citrate (CITRACAL PO), Take 1 capsule by mouth daily., Disp: , Rfl:    Calcium Magnesium Zinc 333-133-5 MG TABS, Take by mouth., Disp: , Rfl:    multivitamin-lutein (OCUVITE-LUTEIN) CAPS capsule, Take 1 capsule by mouth daily., Disp: , Rfl:    Probiotic Product (PROBIOTIC DAILY PO), Take 1 capsule by mouth daily. Flogen, Disp: , Rfl:    Vitamin D, Ergocalciferol, (DRISDOL) 1.25 MG (50000 UNIT) CAPS capsule, Take 1 capsule (50,000 Units total) by mouth every 7 (seven) days., Disp: 12 capsule, Rfl: 4  Allergies  Allergen Reactions   Codeine     REACTION: rash/nausea/vomiting   Influenza Vaccines     "injections site turned black color and had a hen size egg lump at site". Per pt she said not sure if the reactions was from a spider bite or flu shot.     Sulfa Antibiotics Nausea And Vomiting     ROS: Review of Systems Pertinent items  noted in HPI and remainder of comprehensive ROS otherwise negative.    Physical exam BP 126/76   Pulse 83   Temp 97.9 F (36.6 C)   Ht 5\' 4"  (1.626 m)   Wt 159 lb 3.2 oz (72.2 kg)   LMP 08/18/1989   SpO2 92%   BMI 27.33 kg/m  General appearance: alert, cooperative, appears stated age, and no distress Head: Normocephalic, without obvious abnormality, atraumatic Eyes: negative findings: lids and  lashes normal, conjunctivae and sclerae normal, corneas clear, and pupils equal, round, reactive to light and accomodation Ears:  Dermatitis noted on the helix of the left ear without evidence of secondary bacterial infection or complication.  No cystic lesion identified.  TMs intact bilaterally with normal light reflex Nose: Nares normal. Septum midline. Mucosa normal. No drainage or sinus tenderness. Throat: lips, mucosa, and tongue normal; teeth and gums normal Neck: no adenopathy, supple, symmetrical, trachea midline, and thyroid not enlarged, symmetric, no tenderness/mass/nodules Back: symmetric, no curvature. ROM normal. No CVA tenderness. Lungs: clear to auscultation bilaterally Heart: regular rate and rhythm, S1, S2 normal, no murmur, click, rub or gallop Abdomen: soft, non-tender; bowel sounds normal; no masses,  no organomegaly Extremities: extremities normal, atraumatic, no cyanosis or edema Pulses: 2+ and symmetric Skin: Skin color, texture, turgor normal.  She has what appears to be a dermatitis of the left ear as above.  She also had a plantar lesion that appears to be consistent with a wart versus callus on the left lateral foot.  Central umbilication was appreciated. Lymph nodes: Cervical, supraclavicular, and axillary nodes normal. Neurologic: Grossly normal      12/18/2023   10:24 AM 09/10/2023   11:14 AM 12/12/2022    1:19 PM  Depression screen PHQ 2/9  Decreased Interest 0 0 0  Down, Depressed, Hopeless 0 0 0  PHQ - 2 Score 0 0 0  Altered sleeping 0  0  Tired, decreased  energy 0  0  Change in appetite 0  0  Feeling bad or failure about yourself  0  0  Trouble concentrating 0  0  Moving slowly or fidgety/restless 0  0  Suicidal thoughts 0  0  PHQ-9 Score 0  0  Difficult doing work/chores Not difficult at all  Not difficult at all      12/18/2023   10:24 AM 06/19/2023   11:09 AM 12/12/2022    1:19 PM 07/24/2022    1:38 PM  GAD 7 : Generalized Anxiety Score  Nervous, Anxious, on Edge 0 0 0 0  Control/stop worrying 0 0 0 0  Worry too much - different things 0  0 0  Trouble relaxing 0  0 0  Restless 0  0 0  Easily annoyed or irritable 0  0 0  Afraid - awful might happen 0  0 0  Total GAD 7 Score 0  0 0  Anxiety Difficulty Not difficult at all  Not difficult at all Not difficult at all   Recent Results (from the past 2160 hours)  Bayer DCA Hb A1c Waived     Status: Abnormal   Collection Time: 12/13/23  8:37 AM  Result Value Ref Range   HB A1C (BAYER DCA - WAIVED) 5.9 (H) 4.8 - 5.6 %    Comment:          Prediabetes: 5.7 - 6.4          Diabetes: >6.4          Glycemic control for adults with diabetes: <7.0   Magnesium     Status: None   Collection Time: 12/13/23  8:39 AM  Result Value Ref Range   Magnesium 2.0 1.6 - 2.3 mg/dL  VITAMIN D 25 Hydroxy (Vit-D Deficiency, Fractures)     Status: None   Collection Time: 12/13/23  8:39 AM  Result Value Ref Range   Vit D, 25-Hydroxy 33.3 30.0 - 100.0 ng/mL    Comment: Vitamin D deficiency has been defined  by the Institute of Medicine and an Endocrine Society practice guideline as a level of serum 25-OH vitamin D less than 20 ng/mL (1,2). The Endocrine Society went on to further define vitamin D insufficiency as a level between 21 and 29 ng/mL (2). 1. IOM (Institute of Medicine). 2010. Dietary reference    intakes for calcium and D. [place]: The    [employer]. 2. Holick MF, Binkley Algona, Bischoff-Ferrari HA, et al.    Evaluation, treatment, and prevention of vitamin D    deficiency:  an Endocrine Society clinical practice    guideline. JCEM. 2011 Jul; 96(7):1911-30.   TSH     Status: None   Collection Time: 12/13/23  8:39 AM  Result Value Ref Range   TSH 3.360 0.450 - 4.500 uIU/mL  CBC with Differential/Platelet     Status: Abnormal   Collection Time: 12/13/23  8:39 AM  Result Value Ref Range   WBC 3.5 3.4 - 10.8 x10E3/uL   RBC 4.00 3.77 - 5.28 x10E6/uL   Hemoglobin 13.0 11.1 - 15.9 g/dL   Hematocrit 30.8 65.7 - 46.6 %   MCV 95 79 - 97 fL   MCH 32.5 26.6 - 33.0 pg   MCHC 34.4 31.5 - 35.7 g/dL   RDW 84.6 (L) 96.2 - 95.2 %   Platelets 183 150 - 450 x10E3/uL   Neutrophils 48 Not Estab. %   Lymphs 41 Not Estab. %   Monocytes 9 Not Estab. %   Eos 1 Not Estab. %   Basos 1 Not Estab. %   Neutrophils Absolute 1.7 1.4 - 7.0 x10E3/uL   Lymphocytes Absolute 1.4 0.7 - 3.1 x10E3/uL   Monocytes Absolute 0.3 0.1 - 0.9 x10E3/uL   EOS (ABSOLUTE) 0.1 0.0 - 0.4 x10E3/uL   Basophils Absolute 0.0 0.0 - 0.2 x10E3/uL   Immature Granulocytes 0 Not Estab. %   Immature Grans (Abs) 0.0 0.0 - 0.1 x10E3/uL  CMP14+EGFR     Status: Abnormal   Collection Time: 12/13/23  8:39 AM  Result Value Ref Range   Glucose 116 (H) 70 - 99 mg/dL   BUN 14 8 - 27 mg/dL   Creatinine, Ser 8.41 0.57 - 1.00 mg/dL   eGFR 72 >32 GM/WNU/2.72   BUN/Creatinine Ratio 16 12 - 28   Sodium 140 134 - 144 mmol/L   Potassium 3.9 3.5 - 5.2 mmol/L   Chloride 104 96 - 106 mmol/L   CO2 20 20 - 29 mmol/L   Calcium 9.4 8.7 - 10.3 mg/dL   Total Protein 6.6 6.0 - 8.5 g/dL   Albumin 4.5 3.8 - 4.8 g/dL   Globulin, Total 2.1 1.5 - 4.5 g/dL   Bilirubin Total 1.2 0.0 - 1.2 mg/dL   Alkaline Phosphatase 87 44 - 121 IU/L   AST 20 0 - 40 IU/L   ALT 12 0 - 32 IU/L  Lipid panel     Status: Abnormal   Collection Time: 12/13/23  8:39 AM  Result Value Ref Range   Cholesterol, Total 223 (H) 100 - 199 mg/dL   Triglycerides 536 0 - 149 mg/dL   HDL 61 >64 mg/dL   VLDL Cholesterol Cal 22 5 - 40 mg/dL   LDL Chol Calc (NIH) 403  (H) 0 - 99 mg/dL   Chol/HDL Ratio 3.7 0.0 - 4.4 ratio    Comment:  T. Chol/HDL Ratio                                             Men  Women                               1/2 Avg.Risk  3.4    3.3                                   Avg.Risk  5.0    4.4                                2X Avg.Risk  9.6    7.1                                3X Avg.Risk 23.4   11.0    Assessment/ Plan: Krista Henderson here for annual physical exam.   Annual physical exam  Dermatitis of external ear - Plan: triamcinolone cream (KENALOG) 0.1 %  Plantar wart, left foot  Prediabetes - Plan: Bayer DCA Hb A1c Waived  Osteopenia of neck of right femur  Vitamin D deficiency - Plan: Vitamin D, Ergocalciferol, (DRISDOL) 1.25 MG (50000 UNIT) CAPS capsule  Mixed hyperlipidemia  Lesion of concern appears to be consistent with dermatitis.  Kenalog was prescribed for as needed use.  Lesion of concern on foot appears to be consistent with plantar wart.  I offered cryoablation but she would like to try duct tape method and I gave her handout on how to perform this  Future order for A1c placed given prediabetes is worsening.  We discussed how to reduce carbohydrates, increase physical exercise etc. today.  She will get DEXA scan done given history of osteopenia noted 2 years ago.  She will continue vitamin D.  We discussed nondairy options for vitamin D intake and handout was provided  Counseled on healthy lifestyle choices, including diet (rich in fruits, vegetables and lean meats and low in salt and simple carbohydrates) and exercise (at least 30 minutes of moderate physical activity daily).  Patient to follow up 6 months for A1c recheck, sooner if concerns arise  Nils Thor M. Nadine Counts, DO

## 2023-12-19 ENCOUNTER — Encounter: Payer: Self-pay | Admitting: Family Medicine

## 2023-12-21 ENCOUNTER — Telehealth: Payer: Self-pay

## 2023-12-21 NOTE — Telephone Encounter (Signed)
 Reviewed DEXA with pt and recommendations and she would like to hold off on the Fosamax and Prolia due to side effects and that her mother and sister had esophageal erosion due to Fosamax. Pt will consider and call back to speak with Dr Reece Agar once she has considered it.

## 2023-12-21 NOTE — Telephone Encounter (Signed)
 Copied from CRM [id number]. Topic: Clinical - Lab/Test Results >> Dec 21, 2023  2:25 PM Alessandra Bevels wrote: Reason for CRM: Patient is calling because she read results regarding [facility] DEXA (Accession [id number]) (Order [id number]). Patient would like a range of her Dexa from 2 years to now. Please advise

## 2024-02-06 ENCOUNTER — Ambulatory Visit (HOSPITAL_BASED_OUTPATIENT_CLINIC_OR_DEPARTMENT_OTHER): Admitting: Obstetrics & Gynecology

## 2024-02-06 ENCOUNTER — Encounter (HOSPITAL_BASED_OUTPATIENT_CLINIC_OR_DEPARTMENT_OTHER): Payer: Self-pay | Admitting: Obstetrics & Gynecology

## 2024-02-06 VITALS — BP 129/66 | HR 81 | Ht 64.0 in | Wt 161.0 lb

## 2024-02-06 DIAGNOSIS — N9089 Other specified noninflammatory disorders of vulva and perineum: Secondary | ICD-10-CM

## 2024-02-06 NOTE — Progress Notes (Signed)
 GYNECOLOGY  VISIT  CC:   vulvar lesion  HPI: 73 y.o. G70P2002 Married White or Caucasian female here for concerns about a vulvar lesion that has been present for years but is now palpable and she wants to see if needs to be removed.  Has been flat in the past but now is raised.     Past Medical History:  Diagnosis Date   Abnormal Pap smear of cervix    Arthritis    toe , finger, both hips   Atrial fibrillation (HCC)    [place]   Blood transfusion without reported diagnosis    Bone spur 09/2017   Left hip   Endometriosis    Hx of adenomatous colonic polyps    Hx of removal of cyst    right ear   Hyperlipidemia    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   WBC 3,000 08/30/11 52 P 43 L   Migraine without aura    pas thx   Vitamin D  deficiency     MEDS:   Current Outpatient Medications on File Prior to Visit  Medication Sig Dispense Refill   Calcium  Citrate (CITRACAL PO) Take 1 capsule by mouth daily.     Calcium  Magnesium Zinc 333-133-5 MG TABS Take by mouth.     multivitamin-lutein (OCUVITE-LUTEIN) CAPS capsule Take 1 capsule by mouth daily.     Probiotic Product (PROBIOTIC DAILY PO) Take 1 capsule by mouth daily. Flogen     triamcinolone  cream (KENALOG ) 0.1 % Apply 1 Application topically 2 (two) times daily as needed (ear dermatitis). X7-10 days 30 g 0   Vitamin D , Ergocalciferol , (DRISDOL ) 1.25 MG (50000 UNIT) CAPS capsule Take 1 capsule (50,000 Units total) by mouth every 7 (seven) days. 12 capsule 4   No current facility-administered medications on file prior to visit.    ALLERGIES: Codeine, Influenza vaccines, and Sulfa  antibiotics  SH:  married, non smoker  Review of Systems  Constitutional: Negative.   Genitourinary: Negative.     PHYSICAL EXAMINATION:    BP 129/66 (BP Location: Left Arm, Patient Position: Sitting)   Pulse 81   Ht 5\' 4"  (1.626 m)   Wt 161 lb (73 kg)   LMP 08/18/1989   BMI 27.64 kg/m     General  appearance: alert, cooperative and appears stated age  Lymph:  no inguinal LAD noted Pelvic: External genitalia:  pigmented lesion on left vulvar near groin, c/w keratosis   Assessment/Plan: 1. Vulvar lesion (Primary) - do not recommend removal as this is a keratosis

## 2024-02-13 DIAGNOSIS — L84 Corns and callosities: Secondary | ICD-10-CM | POA: Diagnosis not present

## 2024-02-13 DIAGNOSIS — X32XXXD Exposure to sunlight, subsequent encounter: Secondary | ICD-10-CM | POA: Diagnosis not present

## 2024-02-13 DIAGNOSIS — L570 Actinic keratosis: Secondary | ICD-10-CM | POA: Diagnosis not present

## 2024-02-13 DIAGNOSIS — D225 Melanocytic nevi of trunk: Secondary | ICD-10-CM | POA: Diagnosis not present

## 2024-02-13 DIAGNOSIS — Z1283 Encounter for screening for malignant neoplasm of skin: Secondary | ICD-10-CM | POA: Diagnosis not present

## 2024-05-09 ENCOUNTER — Ambulatory Visit: Payer: Self-pay | Admitting: *Deleted

## 2024-05-09 ENCOUNTER — Telehealth: Admitting: Family Medicine

## 2024-05-09 ENCOUNTER — Encounter: Payer: Self-pay | Admitting: Radiology

## 2024-05-09 NOTE — Telephone Encounter (Signed)
 FYI Only or Action Required?: FYI only for provider.  Patient was last seen in primary care on 12/18/2023 by [name], DO.  Called Nurse Triage reporting Otalgia.  Symptoms began yesterday.  Interventions attempted: Nothing.  Symptoms are: stable  Triage Disposition: See Physician Within 24 Hours  Patient/caregiver understands and will follow disposition?: yes Reason for Disposition  Earache  (Exceptions: Brief ear pain of lasting less than 60 minutes, or earache occurring during air travel.)  Answer Assessment - Initial Assessment Questions 1. LOCATION: Which ear is involved?     Right ear 2. ONSET: When did the ear pain start?      yesterday 3. SEVERITY: How bad is the pain?  (Scale 1-10; mild, moderate or severe)     Pain with laying down 4. URI SYMPTOMS: Do you have a runny nose or cough?     Burning in nose, pressure in ear with laying 5. FEVER: Do you have a fever? If Yes, ask: What is your temperature, how was it measured, and when did it start?     no 6. CAUSE: Have you been swimming recently?, How often do you use Q-TIPS?, Have you had any recent air travel or scuba diving?     No- hx rupture 7. OTHER SYMPTOMS: Do you have any other symptoms? (e.g., decreased hearing, dizziness, headache, stiff neck, vomiting)     Headache  Protocols used: Earache-A-AH   Copied from CRM #[id number]. Topic: Clinical - Red Word Triage >> May 09, 2024  8:04 AM Mia F wrote: Red Word that prompted transfer to Nurse Triage: Ear pain. Woke up yesterday evening with an earrache and burning in the nose. Was waken out of her sleep due to the earpain. Has a slight headache. Started last night. Seems to worsen when she lays down.

## 2024-06-12 ENCOUNTER — Other Ambulatory Visit: Payer: Self-pay

## 2024-06-12 ENCOUNTER — Other Ambulatory Visit

## 2024-06-12 DIAGNOSIS — E559 Vitamin D deficiency, unspecified: Secondary | ICD-10-CM | POA: Diagnosis not present

## 2024-06-12 DIAGNOSIS — E782 Mixed hyperlipidemia: Secondary | ICD-10-CM

## 2024-06-12 DIAGNOSIS — R7303 Prediabetes: Secondary | ICD-10-CM

## 2024-06-12 LAB — BAYER DCA HB A1C WAIVED: HB A1C (BAYER DCA - WAIVED): 5.8 % — ABNORMAL HIGH (ref 4.8–5.6)

## 2024-06-12 LAB — LIPID PANEL

## 2024-06-13 ENCOUNTER — Ambulatory Visit: Payer: Self-pay | Admitting: Family Medicine

## 2024-06-13 LAB — LIPID PANEL
Cholesterol, Total: 240 mg/dL — AB (ref 100–199)
HDL: 63 mg/dL (ref >39)
LDL CALC COMMENT:: 3.8 ratio (ref 0.0–4.4)
LDL Chol Calc (NIH): 152 mg/dL — AB (ref 0–99)
Triglycerides: 143 mg/dL (ref 0–149)
VLDL Cholesterol Cal: 25 mg/dL (ref 5–40)

## 2024-06-13 LAB — CBC WITH DIFFERENTIAL/PLATELET
Basophils Absolute: 0 x10E3/uL (ref 0.0–0.2)
Basos: 1 %
EOS (ABSOLUTE): 0.1 x10E3/uL (ref 0.0–0.4)
Eos: 2 %
Hematocrit: 38.8 % (ref 34.0–46.6)
Hemoglobin: 13.3 g/dL (ref 11.1–15.9)
Immature Grans (Abs): 0 x10E3/uL (ref 0.0–0.1)
Immature Granulocytes: 0 %
Lymphocytes Absolute: 1.4 x10E3/uL (ref 0.7–3.1)
Lymphs: 39 %
MCH: 33.2 pg — ABNORMAL HIGH (ref 26.6–33.0)
MCHC: 34.3 g/dL (ref 31.5–35.7)
MCV: 97 fL (ref 79–97)
Monocytes Absolute: 0.3 x10E3/uL (ref 0.1–0.9)
Monocytes: 9 %
Neutrophils Absolute: 1.9 x10E3/uL (ref 1.4–7.0)
Neutrophils: 49 %
Platelets: 187 x10E3/uL (ref 150–450)
RBC: 4.01 x10E6/uL (ref 3.77–5.28)
RDW: 11.9 % (ref 11.7–15.4)
WBC: 3.7 x10E3/uL (ref 3.4–10.8)

## 2024-06-13 LAB — CMP14+EGFR
ALT: 13 IU/L (ref 0–32)
AST: 19 IU/L (ref 0–40)
Albumin: 4.4 g/dL (ref 3.8–4.8)
Alkaline Phosphatase: 84 IU/L (ref 49–135)
BUN/Creatinine Ratio: 17 (ref 12–28)
BUN: 15 mg/dL (ref 8–27)
Bilirubin Total: 1.1 mg/dL (ref 0.0–1.2)
CO2: 20 mmol/L (ref 20–29)
Calcium: 9.4 mg/dL (ref 8.7–10.3)
Chloride: 101 mmol/L (ref 96–106)
Creatinine, Ser: 0.88 mg/dL (ref 0.57–1.00)
Globulin, Total: 2.5 g/dL (ref 1.5–4.5)
Glucose: 127 mg/dL — AB (ref 70–99)
Potassium: 4.2 mmol/L (ref 3.5–5.2)
Sodium: 138 mmol/L (ref 134–144)
Total Protein: 6.9 g/dL (ref 6.0–8.5)
eGFR: 70 mL/min/1.73 (ref >59)

## 2024-06-13 LAB — VITAMIN D 25 HYDROXY (VIT D DEFICIENCY, FRACTURES): Vit D, 25-Hydroxy: 31.4 ng/mL (ref 30.0–100.0)

## 2024-06-16 ENCOUNTER — Telehealth: Payer: Self-pay | Admitting: Family Medicine

## 2024-06-16 NOTE — Telephone Encounter (Unsigned)
 Copied from CRM #[id number]. Topic: Clinical - Lab/Test Results >> Jun 16, 2024  4:27 PM Graeme ORN wrote: Reason for CRM: Patient Returned missed call for lab results. Read note as written. Patient has not seen on MyChart but let her know she could view there as well. Would like a call back to discuss further. Thank You

## 2024-06-17 ENCOUNTER — Ambulatory Visit: Admitting: Family Medicine

## 2024-06-17 NOTE — Telephone Encounter (Signed)
 Called and spoke with patient and discussed results. Patient has no further questions.

## 2024-06-23 ENCOUNTER — Ambulatory Visit: Payer: Self-pay

## 2024-06-23 ENCOUNTER — Encounter: Payer: Self-pay | Admitting: Family

## 2024-06-23 ENCOUNTER — Ambulatory Visit (INDEPENDENT_AMBULATORY_CARE_PROVIDER_SITE_OTHER): Admitting: Family

## 2024-06-23 VITALS — BP 138/85 | HR 83 | Temp 98.5°F | Ht 64.0 in | Wt 159.0 lb

## 2024-06-23 DIAGNOSIS — R42 Dizziness and giddiness: Secondary | ICD-10-CM | POA: Diagnosis not present

## 2024-06-23 DIAGNOSIS — H6993 Unspecified Eustachian tube disorder, bilateral: Secondary | ICD-10-CM

## 2024-06-23 MED ORDER — CETIRIZINE HCL 10 MG PO TABS: 10.0000 mg | ORAL_TABLET | Freq: Every day | ORAL | 1 refills | Status: AC

## 2024-06-23 MED ORDER — MECLIZINE HCL 25 MG PO TABS: 25.0000 mg | ORAL_TABLET | Freq: Three times a day (TID) | ORAL | 0 refills | Status: DC | PRN

## 2024-06-23 MED ORDER — FLUTICASONE PROPIONATE 50 MCG/ACT NA SUSP: 2.0000 | Freq: Every day | NASAL | 6 refills | Status: DC

## 2024-06-23 NOTE — Telephone Encounter (Signed)
 Patient has appt in office today

## 2024-06-23 NOTE — Patient Instructions (Signed)
 Eustachian Tube Dysfunction  Eustachian tube dysfunction refers to a condition in which a blockage develops in the narrow passage that connects the middle ear to the back of the nose (eustachian tube). The eustachian tube regulates air pressure in the middle ear by letting air move between the ear and nose. It also helps to drain fluid from the middle ear space. Eustachian tube dysfunction can affect one or both ears. When the eustachian tube does not function properly, air pressure, fluid, or both can build up in the middle ear. What are the causes? This condition occurs when the eustachian tube becomes blocked or cannot open normally. Common causes of this condition include: Ear infections. Colds and other infections that affect the nose, mouth, and throat (upper respiratory tract). Allergies. Irritation from cigarette smoke. Irritation from stomach acid coming up into the esophagus (gastroesophageal reflux). The esophagus is the part of the body that moves food from the mouth to the stomach. Sudden changes in air pressure, such as from descending in an airplane or scuba diving. Abnormal growths in the nose or throat, such as: Growths that line the nose (nasal polyps). Abnormal growth of cells (tumors). Enlarged tissue at the back of the throat (adenoids). What increases the risk? You are more likely to develop this condition if: You smoke. You are overweight. You are a child who has: Certain birth defects of the mouth, such as cleft palate. Large tonsils or adenoids. What are the signs or symptoms? Common symptoms of this condition include: A feeling of fullness in the ear. Ear pain. Clicking or popping noises in the ear. Ringing in the ear (tinnitus). Hearing loss. Loss of balance. Dizziness. Symptoms may get worse when the air pressure around you changes, such as when you travel to an area of high elevation, fly on an airplane, or go scuba diving. How is this diagnosed? This  condition may be diagnosed based on: Your symptoms. A physical exam of your ears, nose, and throat. Tests, such as those that measure: The movement of your eardrum. Your hearing (audiometry). How is this treated? Treatment depends on the cause and severity of your condition. In mild cases, you may relieve your symptoms by moving air into your ears. This is called "popping the ears." In more severe cases, or if you have symptoms of fluid in your ears, treatment may include: Medicines to relieve congestion (decongestants). Medicines that treat allergies (antihistamines). Nasal sprays or ear drops that contain medicines that reduce swelling (steroids). A procedure to drain the fluid in your eardrum. In this procedure, a small tube may be placed in the eardrum to: Drain the fluid. Restore the air in the middle ear space. A procedure to insert a balloon device through the nose to inflate the opening of the eustachian tube (balloon dilation). Follow these instructions at home: Lifestyle Do not do any of the following until your health care provider approves: Travel to high altitudes. Fly in airplanes. Work in a Estate agent or room. Scuba dive. Do not use any products that contain nicotine or tobacco. These products include cigarettes, chewing tobacco, and vaping devices, such as e-cigarettes. If you need help quitting, ask your health care provider. Keep your ears dry. Wear fitted earplugs during showering and bathing. Dry your ears completely after. General instructions Take over-the-counter and prescription medicines only as told by your health care provider. Use techniques to help pop your ears as recommended by your health care provider. These may include: Chewing gum. Yawning. Frequent, forceful swallowing.  Closing your mouth, holding your nose closed, and gently blowing as if you are trying to blow air out of your nose. Keep all follow-up visits. This is important. Contact a  health care provider if: Your symptoms do not go away after treatment. Your symptoms come back after treatment. You are unable to pop your ears. You have: A fever. Pain in your ear. Pain in your head or neck. Fluid draining from your ear. Your hearing suddenly changes. You become very dizzy. You lose your balance. Get help right away if: You have a sudden, severe increase in any of your symptoms. Summary Eustachian tube dysfunction refers to a condition in which a blockage develops in the eustachian tube. It can be caused by ear infections, allergies, inhaled irritants, or abnormal growths in the nose or throat. Symptoms may include ear pain or fullness, hearing loss, or ringing in the ears. Mild cases are treated with techniques to unblock the ears, such as yawning or chewing gum. More severe cases are treated with medicines or procedures. This information is not intended to replace advice given to you by your health care provider. Make sure you discuss any questions you have with your health care provider. Document Revised: 11/15/2020 Document Reviewed: 11/15/2020 Elsevier Patient Education  2024 [employer].

## 2024-06-23 NOTE — Telephone Encounter (Signed)
 FYI Only or Action Required?: Action required by provider: request for appointment.  Patient was last seen in primary care on 12/18/2023 by [name], DO.  Called Nurse Triage reporting Otalgia.  Symptoms began several days ago.  Interventions attempted: Prescription medications: flonase .  Symptoms are: unchanged.  Triage Disposition: See HCP Within 4 Hours (Or PCP Triage)  Patient/caregiver understands and will follow disposition?: YesCopied from CRM [id number]. Topic: Clinical - Red Word Triage >> Jun 23, 2024  8:02 AM Zane F wrote: Red Word that prompted transfer to Nurse Triage:   Concern: pain in both ears  Symptoms:   Yellow mucous   Intense vertigo  Dizziness while walking  Throbbing ear pain   When did the symptoms start?: 5 pm Friday 06/20/2024   What have you done to aid in the concern ? Have you taken anything to assist with the matter?: Yes    If so, what did you take?: Flonase    Wanted to let you know I will be transferring you to further discuss your concern. Please be advised the nurse can assist with scheduling. Reason for Disposition  [1] SEVERE pain (e.g., excruciating) and [2] not improved 2 hours after pain medicine (e.g., acetaminophen or ibuprofen)  Answer Assessment - Initial Assessment Questions Throbbing pain in both ears/ vertigo caused nausea/vomiting. I've had this before and she recommended flonase  and dizziness medication. I took flonase  and nothing else.        1. LOCATION: Which ear is involved?     Both ears 2. ONSET: When did the ear pain start?      Last weekend  3. SEVERITY: How bad is the pain?  (Scale 1-10; mild, moderate or severe)     Lying down-8 4. URI SYMPTOMS: Do you have a runny nose or cough?     Runny nose 5. FEVER: Do you have a fever? If Yes, ask: What is your temperature, how was it measured, and when did it start?     denies 6. CAUSE: Have you been swimming recently?, How often do  you use Q-TIPS?, Have you had any recent air travel or scuba diving?     na 7. OTHER SYMPTOMS: Do you have any other symptoms? (e.g., decreased hearing, dizziness, headache, stiff neck, vomiting) dizziness  Protocols used: Earache-A-AH

## 2024-06-23 NOTE — Progress Notes (Signed)
 Subjective:    Patient ID: Krista Henderson, female    DOB: 10-Nov-1950, 73 y.o.   MRN: [id number]  Chief Complaint  Patient presents with   Ear Pain    Scince Friday can't lay on ears. Blowing ligh colored mucus patient using mucenix.   Dizziness   Pt presents to the office today with bilateral ear pain with dizziness that started Friday.  Dizziness This is a new problem. The problem has been gradually worsening. Pertinent negatives include no coughing, headaches or sore throat.  Otalgia  There is pain in both ears. The current episode started in the past 7 days. The problem occurs constantly. The problem has been gradually worsening. There has been no fever. The pain is at a severity of 8/10. The pain is moderate. Associated symptoms include rhinorrhea. Pertinent negatives include no coughing, ear discharge, headaches, hearing loss or sore throat. Treatments tried: flonase . The treatment provided mild relief.      Review of Systems  HENT:  Positive for ear pain and rhinorrhea. Negative for ear discharge, hearing loss and sore throat.   Respiratory:  Negative for cough.   Neurological:  Positive for dizziness. Negative for headaches.  All other systems reviewed and are negative.   Social History   Socioeconomic History   Marital status: Married    Spouse name: Oneil   Number of children: Not on file   Years of education: Not on file   Highest education level: Associate degree: occupational, Scientist, product/process development, or vocational program  Occupational History   Not on file  Tobacco Use   Smoking status: Never   Smokeless tobacco: Never  Vaping Use   Vaping status: Never Used  Substance and Sexual Activity   Alcohol use: No   Drug use: No   Sexual activity: Yes    Partners: Male    Birth control/protection: Surgical    Comment: Hysterectomy  Other Topics Concern   Not on file  Social History Narrative   Married to [place].  Her daughter is expecting a child in the next couple of days.   They will have a total of 2 grandchildren, both girls.   Social Drivers of Corporate investment banker Strain: Low Risk  (12/17/2023)   Overall Financial Resource Strain (CARDIA)    Difficulty of Paying Living Expenses: Not hard at all  Food Insecurity: No Food Insecurity (12/17/2023)   Hunger Vital Sign    Worried About Running Out of Food in the Last Year: Never true    Ran Out of Food in the Last Year: Never true  Transportation Needs: No Transportation Needs (12/17/2023)   PRAPARE - Administrator, Civil Service (Medical): No    Lack of Transportation (Non-Medical): No  Physical Activity: Sufficiently Active (12/17/2023)   Exercise Vital Sign    Days of Exercise per Week: 4 days    Minutes of Exercise per Session: 50 min  Stress: No Stress Concern Present (12/17/2023)   [employer] of Occupational Health - Occupational Stress Questionnaire    Feeling of Stress : Not at all  Social Connections: Unknown (12/17/2023)   Social Connection and Isolation Panel    Frequency of Communication with Friends and Family: More than three times a week    Frequency of Social Gatherings with Friends and Family: Not on file    Attends Religious Services: More than 4 times per year    Active Member of [employer] or Organizations: Not on file    Attends [employer]  or Organization Meetings: Not on file    Marital Status: Married   Family History  Problem Relation Age of Onset   Diabetes Mother    Osteoporosis Mother    Hypertension Mother    Colon cancer Father 57   Colon polyps Father    Stomach cancer Paternal Apolinar Gavel' disease Daughter    Breast cancer Paternal Aunt        46's   Cancer Paternal Aunt        breast   Esophageal cancer Neg Hx    Rectal cancer Neg Hx         Objective:   Physical Exam Vitals reviewed.  Constitutional:      General: She is not in acute distress.    Appearance: She is well-developed.  HENT:     Head: Normocephalic and atraumatic.      Right Ear: A middle ear effusion is present.     Left Ear: A middle ear effusion is present.  Eyes:     Pupils: Pupils are equal, round, and reactive to light.  Neck:     Thyroid : No thyromegaly.  Cardiovascular:     Rate and Rhythm: Normal rate and regular rhythm.     Heart sounds: Normal heart sounds. No murmur heard. Pulmonary:     Effort: Pulmonary effort is normal. No respiratory distress.     Breath sounds: Normal breath sounds. No wheezing.  Abdominal:     General: Bowel sounds are normal. There is no distension.     Palpations: Abdomen is soft.     Tenderness: There is no abdominal tenderness.  Musculoskeletal:        General: No tenderness. Normal range of motion.     Cervical back: Normal range of motion and neck supple.  Skin:    General: Skin is warm and dry.  Neurological:     Mental Status: She is alert and oriented to person, place, and time.     Cranial Nerves: No cranial nerve deficit.     Deep Tendon Reflexes: Reflexes are normal and symmetric.  Psychiatric:        Behavior: Behavior normal.        Thought Content: Thought content normal.        Judgment: Judgment normal.       BP 138/85   Pulse 83   Temp 98.5 F (36.9 C) (Temporal)   Ht 5' 4 (1.626 m)   Wt 159 lb (72.1 kg)   LMP 08/18/1989   BMI 27.29 kg/m      Assessment & Plan:  Krista Henderson comes in today with chief complaint of Ear Pain (Scince Friday can't lay on ears. Blowing ligh colored mucus patient using mucenix.) and Dizziness   Diagnosis and orders addressed:  1. Dizziness (Primary) Fall precautions  Antivert  TID prn  Epley exercises, handout given  - meclizine  (ANTIVERT ) 25 MG tablet; Take 1 tablet (25 mg total) by mouth 3 (three) times daily as needed for dizziness.  Dispense: 30 tablet; Refill: 0  2. Eustachian tube disorder, bilateral Start zyrtec , flonase , and nasal decongestant  Force fluids - cetirizine  (ZYRTEC  ALLERGY) 10 MG tablet; Take 1 tablet (10 mg total) by  mouth daily.  Dispense: 90 tablet; Refill: 1 - fluticasone  (FLONASE ) 50 MCG/ACT nasal spray; Place 2 sprays into both nostrils daily.  Dispense: 16 g; Refill: 6    Bari Learn, FNP

## 2024-06-24 NOTE — Telephone Encounter (Signed)
 Patient aware and verbalized understanding.

## 2024-07-18 ENCOUNTER — Ambulatory Visit: Payer: Self-pay

## 2024-07-18 NOTE — Telephone Encounter (Signed)
 FYI Only or Action Required?: Action required by provider: request for appointment.  Patient was last seen in primary care on 06/23/2024 by Lavell Bari LABOR, FNP.  Called Nurse Triage reporting Otalgia.  Symptoms began several weeks ago.  Interventions attempted: Prescription medications:  SABRA  Symptoms are: unchanged.Pt. Still has fluid in my ear and the left one is swollen. Declines OV. Asking for medication for the fluid in my ear. Taking Flonase  and Zyrtec . Please advise pt.  Triage Disposition: See Physician Within 24 Hours  Patient/caregiver understands and will follow disposition?: No, wishes to speak with PCP    Copied from CRM [id number]. Topic: Clinical - Red Word Triage >> Jul 18, 2024 10:08 AM Willma SAUNDERS wrote: [facility] that prompted transfer to Nurse Triage: Patient states she in a few weeks ago for ear pain, states she still has pain/fluid in her ears and left ear is swollen. Also states she is still feeling dizzy when she first gets up despite the medication that was provided. Reason for Disposition  Earache  (Exceptions: Brief ear pain of lasting less than 60 minutes, or earache occurring during air travel.)  Answer Assessment - Initial Assessment Questions 1. LOCATION: Which ear is involved?     both 2. ONSET: When did the ear pain start?      2 weeks 3. SEVERITY: How bad is the pain?  (Scale 1-10; mild, moderate or severe)     severe 4. URI SYMPTOMS: Do you have a runny nose or cough?     no 5. FEVER: Do you have a fever? If Yes, ask: What is your temperature, how was it measured, and when did it start?     no 6. CAUSE: Have you been swimming recently?, How often do you use Q-TIPS?, Have you had any recent air travel or scuba diving?     no 7. OTHER SYMPTOMS: Do you have any other symptoms? (e.g., decreased hearing, dizziness, headache, stiff neck, vomiting)     Swelling to left ear, dizzy in morning 8. PREGNANCY: Is there any chance you  are pregnant? When was your last menstrual period?     no  Protocols used: [place]

## 2024-07-18 NOTE — Telephone Encounter (Signed)
 CRM # [id number] Owner: None Status: Unresolved Primary Information  Source  Krista Henderson (Patient)   Subject  Krista Henderson (Patient)   Topic  Clinical - Red Word Triage    Communication  Red Word that prompted transfer to Nurse Triage: pain and fluid in patients ear.       Answer Assessment - Initial Assessment Questions 1. REASON FOR CALL: What is the main reason for your call? or How can I best help you?     Usually gets in the fall, cyst cut out of her ear in the past.Feels fluid more on the left side but bilateral.   Flonase , zyrtec  and Meclizine -with no real improvement.  She is looking to see if ear drops could be called in to help clear up the fluid in her ear. No worsening of symptoms just persistent and doesn't want another cyst or ruptured ear drum. LOV with Lavell, NP 10/3  Protocols used: Information Only Call - No Triage-A-AH

## 2024-07-18 NOTE — Telephone Encounter (Signed)
 Patient states that her ears are still bothering her. She still feels the fluid swooshing in her ears.  She said she was told to call back if the zyrtec , flonase  and meclizine  did not work.  She would like ear drops prescribed. She says she saw Bari Learn, FNP for this in early October.

## 2024-07-21 ENCOUNTER — Encounter: Payer: Self-pay | Admitting: Radiology

## 2024-07-21 ENCOUNTER — Telehealth: Payer: Self-pay | Admitting: Family Medicine

## 2024-07-21 DIAGNOSIS — R42 Dizziness and giddiness: Secondary | ICD-10-CM

## 2024-07-21 DIAGNOSIS — H6993 Unspecified Eustachian tube disorder, bilateral: Secondary | ICD-10-CM

## 2024-07-21 MED ORDER — PREDNISONE 20 MG PO TABS: 40.0000 mg | ORAL_TABLET | Freq: Every day | ORAL | 0 refills | Status: AC

## 2024-07-21 NOTE — Telephone Encounter (Signed)
 See previous note

## 2024-07-21 NOTE — Telephone Encounter (Signed)
 Appointment scheduled.

## 2024-07-21 NOTE — Telephone Encounter (Signed)
 Called and spoke with patient she is going to hold off on the medication until she sees Dr. KANDICE tomorrow.

## 2024-07-21 NOTE — Telephone Encounter (Signed)
 Pt called Friday afternoon wanting something for her ear pain called in. Message was sent to [facility].

## 2024-07-21 NOTE — Telephone Encounter (Signed)
 REFERRAL REQUEST Telephone Note  Have you been seen at our office for this problem? yes (Advise that they may need an appointment with their PCP before a referral can be done)  Reason for Referral: ear pain Referral discussed with patient: pt saw christy hawks for ear pain  Best contact number of patient for referral team: [phone]    Has patient been seen by a specialist for this issue before: no  Patient provider preference for referral: Dr Karis in [place] phone [phone] Patient location preference for referral: [place]   Patient notified that referrals can take up to a week or longer to process. If they haven't heard anything within a week they should call back and speak with the referral department.

## 2024-07-21 NOTE — Telephone Encounter (Signed)
 Prednisone and referral to ENT placed.

## 2024-07-22 ENCOUNTER — Ambulatory Visit (INDEPENDENT_AMBULATORY_CARE_PROVIDER_SITE_OTHER): Admitting: Family Medicine

## 2024-07-22 VITALS — BP 107/73 | HR 95 | Temp 97.1°F | Ht 64.5 in | Wt 160.0 lb

## 2024-07-22 DIAGNOSIS — H6691 Otitis media, unspecified, right ear: Secondary | ICD-10-CM

## 2024-07-22 DIAGNOSIS — H6993 Unspecified Eustachian tube disorder, bilateral: Secondary | ICD-10-CM

## 2024-07-22 MED ORDER — AMOXICILLIN-POT CLAVULANATE 875-125 MG PO TABS: 1.0000 | ORAL_TABLET | Freq: Two times a day (BID) | ORAL | 0 refills | Status: AC

## 2024-07-22 NOTE — Progress Notes (Signed)
 Subjective: RR:nujohpj PCP: Jolinda Krista HERO, DO YEP:Krista Henderson is a 73 y.o. female presenting to clinic today for:  Patient reports onset of otalgia over a month ago.  She actually notes it was precipitating quite a bit of dizziness and she was seen here in the office and placed on oral antihistamines and nasal sprays.  Dizziness did get slightly better with meclizine  but she notes no improvement in the ear pain and fullness.  She typically tries to lie on her left side and notes that was really uncomfortable at 1 point.  She does not report any fevers but is wondering if maybe she needs to see Dr. Karis with ENT.  She consulted one of her pediatrician friends and they recommended maybe he might be a good option.  She has not been on any antibiotics thus far   ROS: Per HPI  Allergies  Allergen Reactions   Codeine     REACTION: rash/nausea/vomiting   Influenza Vaccines     injections site turned black color and had a hen size egg lump at site. Per pt she said not sure if the reactions was from a spider bite or flu shot.     Sulfa  Antibiotics Nausea And Vomiting   Past Medical History:  Diagnosis Date   Abnormal Pap smear of cervix    Arthritis    toe , finger, both hips   Atrial fibrillation (HCC)    [place]   Blood transfusion without reported diagnosis    Bone spur 09/2017   Left hip   Endometriosis    Hx of adenomatous colonic polyps    Hx of removal of cyst    right ear   Hyperlipidemia    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   WBC 3,000 08/30/11 52 P 43 L   Migraine without aura    pas thx   Vitamin D  deficiency     Current Outpatient Medications:    Calcium  Citrate (CITRACAL PO), Take 1 capsule by mouth daily., Disp: , Rfl:    Calcium  Magnesium Zinc 333-133-5 MG TABS, Take by mouth., Disp: , Rfl:    cetirizine  (ZYRTEC  ALLERGY) 10 MG tablet, Take 1 tablet (10 mg total) by mouth daily., Disp: 90 tablet, Rfl: 1    fluticasone  (FLONASE ) 50 MCG/ACT nasal spray, Place 2 sprays into both nostrils daily., Disp: 16 g, Rfl: 6   meclizine  (ANTIVERT ) 25 MG tablet, Take 1 tablet (25 mg total) by mouth 3 (three) times daily as needed for dizziness., Disp: 30 tablet, Rfl: 0   multivitamin-lutein (OCUVITE-LUTEIN) CAPS capsule, Take 1 capsule by mouth daily., Disp: , Rfl:    predniSONE (DELTASONE) 20 MG tablet, Take 2 tablets (40 mg total) by mouth daily with breakfast for 5 days., Disp: 10 tablet, Rfl: 0   Probiotic Product (PROBIOTIC DAILY PO), Take 1 capsule by mouth daily. Flogen, Disp: , Rfl:    triamcinolone  cream (KENALOG ) 0.1 %, Apply 1 Application topically 2 (two) times daily as needed (ear dermatitis). X7-10 days, Disp: 30 g, Rfl: 0   Vitamin D , Ergocalciferol , (DRISDOL ) 1.25 MG (50000 UNIT) CAPS capsule, Take 1 capsule (50,000 Units total) by mouth every 7 (seven) days., Disp: 12 capsule, Rfl: 4 Social History   Socioeconomic History   Marital status: Married    Spouse name: Oneil   Number of children: Not on file   Years of education: Not on file   Highest education level: Associate degree: occupational, scientist, product/process development, or vocational  program  Occupational History   Not on file  Tobacco Use   Smoking status: Never   Smokeless tobacco: Never  Vaping Use   Vaping status: Never Used  Substance and Sexual Activity   Alcohol use: No   Drug use: No   Sexual activity: Yes    Partners: Male    Birth control/protection: Surgical    Comment: Hysterectomy  Other Topics Concern   Not on file  Social History Narrative   Married to [place].  Her daughter is expecting a child in the next couple of days.  They will have a total of 2 grandchildren, both girls.   Social Drivers of Corporate Investment Banker Strain: Low Risk  (12/17/2023)   Overall Financial Resource Strain (CARDIA)    Difficulty of Paying Living Expenses: Not hard at all  Food Insecurity: No Food Insecurity (12/17/2023)   Hunger Vital Sign     Worried About Running Out of Food in the Last Year: Never true    Ran Out of Food in the Last Year: Never true  Transportation Needs: No Transportation Needs (12/17/2023)   PRAPARE - Administrator, Civil Service (Medical): No    Lack of Transportation (Non-Medical): No  Physical Activity: Sufficiently Active (12/17/2023)   Exercise Vital Sign    Days of Exercise per Week: 4 days    Minutes of Exercise per Session: 50 min  Stress: No Stress Concern Present (12/17/2023)   [employer] of Occupational Health - Occupational Stress Questionnaire    Feeling of Stress : Not at all  Social Connections: Unknown (12/17/2023)   Social Connection and Isolation Panel    Frequency of Communication with Friends and Family: More than three times a week    Frequency of Social Gatherings with Friends and Family: Not on file    Attends Religious Services: More than 4 times per year    Active Member of [employer] or Organizations: Not on file    Attends Banker Meetings: Not on file    Marital Status: Married  Catering Manager Violence: Not on file   Family History  Problem Relation Age of Onset   Diabetes Mother    Osteoporosis Mother    Hypertension Mother    Colon cancer Father 62   Colon polyps Father    Stomach cancer Paternal Apolinar Gavel' disease Daughter    Breast cancer Paternal Aunt        36's   Cancer Paternal Aunt        breast   Esophageal cancer Neg Hx    Rectal cancer Neg Hx     Objective: Office vital signs reviewed. BP 107/73   Pulse 95   Temp (!) 97.1 F (36.2 C)   Ht 5' 4.5 (1.638 m)   Wt 160 lb (72.6 kg)   LMP 08/18/1989   SpO2 93%   BMI 27.04 kg/m   Physical Examination:  General: Awake, alert, well nourished, No acute distress HEENT: Left TM with clear effusion appreciated.  No erythema.  She has normal light reflex.  Right TM with some scarring appreciated and mucopurulent discharge behind the tympanic membrane.  No marked  erythema or bulging appreciated    Assessment/ Plan: 73 y.o. female   Acute otitis media, right - Plan: amoxicillin -clavulanate (AUGMENTIN ) 875-125 MG tablet  Eustachian tube disorder, bilateral   She has opaque fluid behind the right TM so going empirically treat her for acute otitis media with oral antibiotics.  She will reach out to me should she develop any yeast vaginitis.  We discussed and I demonstrated how to appropriately utilize the corticosteroid nasal spray.  She will hold off on oral corticosteroid for now but may certainly revisit this medication once she has cleared the infection.  I do think that there is some level of eustachian tube dysfunction and I do not think that referral to ENT is unreasonable.  Hopefully however she can improve symptoms with current recommendations.  Will plan for 2-week follow-up, sooner if she needs to be seen   Krista CHRISTELLA Fielding, DO [facility] [phone]

## 2024-07-22 NOTE — Patient Instructions (Signed)
 Eustachian Tube Dysfunction  Eustachian tube dysfunction refers to a condition in which a blockage develops in the narrow passage that connects the middle ear to the back of the nose (eustachian tube). The eustachian tube regulates air pressure in the middle ear by letting air move between the ear and nose. It also helps to drain fluid from the middle ear space. Eustachian tube dysfunction can affect one or both ears. When the eustachian tube does not function properly, air pressure, fluid, or both can build up in the middle ear. What are the causes? This condition occurs when the eustachian tube becomes blocked or cannot open normally. Common causes of this condition include: Ear infections. Colds and other infections that affect the nose, mouth, and throat (upper respiratory tract). Allergies. Irritation from cigarette smoke. Irritation from stomach acid coming up into the esophagus (gastroesophageal reflux). The esophagus is the part of the body that moves food from the mouth to the stomach. Sudden changes in air pressure, such as from descending in an airplane or scuba diving. Abnormal growths in the nose or throat, such as: Growths that line the nose (nasal polyps). Abnormal growth of cells (tumors). Enlarged tissue at the back of the throat (adenoids). What increases the risk? You are more likely to develop this condition if: You smoke. You are overweight. You are a child who has: Certain birth defects of the mouth, such as cleft palate. Large tonsils or adenoids. What are the signs or symptoms? Common symptoms of this condition include: A feeling of fullness in the ear. Ear pain. Clicking or popping noises in the ear. Ringing in the ear (tinnitus). Hearing loss. Loss of balance. Dizziness. Symptoms may get worse when the air pressure around you changes, such as when you travel to an area of high elevation, fly on an airplane, or go scuba diving. How is this diagnosed? This  condition may be diagnosed based on: Your symptoms. A physical exam of your ears, nose, and throat. Tests, such as those that measure: The movement of your eardrum. Your hearing (audiometry). How is this treated? Treatment depends on the cause and severity of your condition. In mild cases, you may relieve your symptoms by moving air into your ears. This is called "popping the ears." In more severe cases, or if you have symptoms of fluid in your ears, treatment may include: Medicines to relieve congestion (decongestants). Medicines that treat allergies (antihistamines). Nasal sprays or ear drops that contain medicines that reduce swelling (steroids). A procedure to drain the fluid in your eardrum. In this procedure, a small tube may be placed in the eardrum to: Drain the fluid. Restore the air in the middle ear space. A procedure to insert a balloon device through the nose to inflate the opening of the eustachian tube (balloon dilation). Follow these instructions at home: Lifestyle Do not do any of the following until your health care provider approves: Travel to high altitudes. Fly in airplanes. Work in a Estate agent or room. Scuba dive. Do not use any products that contain nicotine or tobacco. These products include cigarettes, chewing tobacco, and vaping devices, such as e-cigarettes. If you need help quitting, ask your health care provider. Keep your ears dry. Wear fitted earplugs during showering and bathing. Dry your ears completely after. General instructions Take over-the-counter and prescription medicines only as told by your health care provider. Use techniques to help pop your ears as recommended by your health care provider. These may include: Chewing gum. Yawning. Frequent, forceful swallowing.  Closing your mouth, holding your nose closed, and gently blowing as if you are trying to blow air out of your nose. Keep all follow-up visits. This is important. Contact a  health care provider if: Your symptoms do not go away after treatment. Your symptoms come back after treatment. You are unable to pop your ears. You have: A fever. Pain in your ear. Pain in your head or neck. Fluid draining from your ear. Your hearing suddenly changes. You become very dizzy. You lose your balance. Get help right away if: You have a sudden, severe increase in any of your symptoms. Summary Eustachian tube dysfunction refers to a condition in which a blockage develops in the eustachian tube. It can be caused by ear infections, allergies, inhaled irritants, or abnormal growths in the nose or throat. Symptoms may include ear pain or fullness, hearing loss, or ringing in the ears. Mild cases are treated with techniques to unblock the ears, such as yawning or chewing gum. More severe cases are treated with medicines or procedures. This information is not intended to replace advice given to you by your health care provider. Make sure you discuss any questions you have with your health care provider. Document Revised: 11/15/2020 Document Reviewed: 11/15/2020 Elsevier Patient Education  2024 [employer].

## 2024-07-29 ENCOUNTER — Telehealth: Payer: Self-pay | Admitting: Family Medicine

## 2024-07-29 ENCOUNTER — Telehealth: Payer: Self-pay

## 2024-07-29 NOTE — Telephone Encounter (Signed)
 Left message to get more details. LS

## 2024-07-29 NOTE — Telephone Encounter (Signed)
 Pt states that she is taking the Augmentin  for 7 days now. 3 days left. Infection in right ear seems to be clearing up. Still having a lot of fluid in the left ear now and it hurts and throbs if she lays on her left side.

## 2024-07-29 NOTE — Telephone Encounter (Signed)
 She was given augmentin .  Not sure what else she is asking for.

## 2024-07-29 NOTE — Telephone Encounter (Signed)
 Copied from CRM [id number]. Topic: Clinical - Medication Question >> Jul 29, 2024  9:48 AM Delon DASEN wrote: Reason for CRM: left ear is still hurting, would like medication called in- patient [phone]

## 2024-07-29 NOTE — Telephone Encounter (Signed)
 Addressed in a separate encounter. LS

## 2024-07-29 NOTE — Telephone Encounter (Signed)
 Copied from CRM [id number]. Topic: General - Other >> Jul 29, 2024  2:47 PM Nathanel BROCKS wrote: Reason for CRM: pt called back to speak with nurse, she left a message to return her call. Please return her call.

## 2024-07-30 ENCOUNTER — Telehealth: Payer: Self-pay | Admitting: Family Medicine

## 2024-07-30 NOTE — Telephone Encounter (Signed)
 Did she pick up the prednisone Christy sent in?  If not, have her go ahead and start that for the discomfort and swelling. Continue the antibiotic until gone.

## 2024-07-30 NOTE — Telephone Encounter (Signed)
 Krista Henderson [facility]   07/30/24  2:45 PM Unsigned Note Copied from CRM #[id number]. Topic: General - Other >> Jul 30, 2024  2:41 PM Victoria B wrote: Reason for CRM: patient returned call back to 'ashley about prednisone and the antibiotic. I related the message back left about if hadnt picked up the prednisone to continue with antibiotic and use prednisone for the swelling. Patient didn't agree with this. Please cb to discuss

## 2024-07-30 NOTE — Telephone Encounter (Signed)
 Refer to other encounter.

## 2024-07-30 NOTE — Telephone Encounter (Signed)
 lmcb

## 2024-07-30 NOTE — Telephone Encounter (Unsigned)
 Copied from CRM [id number]. Topic: General - Other >> Jul 30, 2024  2:41 PM Victoria B wrote: Reason for CRM: patient returned call back to 'ashley about prednisone and the antibiotic. I related the message back left about if hadnt picked up the prednisone to continue with antibiotic and use prednisone for the swelling. Patient didn't agree with this. Please cb to discuss

## 2024-08-04 NOTE — Telephone Encounter (Signed)
 Patient spoke to Dr. Jolinda.

## 2024-08-05 ENCOUNTER — Ambulatory Visit (INDEPENDENT_AMBULATORY_CARE_PROVIDER_SITE_OTHER): Admitting: Family Medicine

## 2024-08-05 ENCOUNTER — Encounter: Payer: Self-pay | Admitting: Family Medicine

## 2024-08-05 VITALS — BP 115/71 | HR 80 | Temp 97.7°F | Ht 64.5 in | Wt 162.0 lb

## 2024-08-05 DIAGNOSIS — H6993 Unspecified Eustachian tube disorder, bilateral: Secondary | ICD-10-CM

## 2024-08-05 NOTE — Progress Notes (Signed)
 Subjective: CC: Follow-up acute otitis media PCP: [name], DO YEP:Krista Henderson is a 73 y.o. female presenting to clinic today for:  Patient diagnosed with acute otitis media on the right on July 22, 2024.  She was placed on oral antibiotics.  She notes that she did notice improvement in the right ear discomfort and symptoms with use of antibiotics but started developing some pressure on the left side last week.  That is since resolved.  She does get occasional orthostasis when she gets up from bed in the morning but this is rare and she has not required meclizine .  No vertiginous symptoms.  No drainage from the ears.  She still has a little bit of full feeling and feels like she probably has some fluid still on the ears but is using lysing her Zyrtec  and her Flonase  as directed.  She reports no fevers or other concerns today.   ROS: Per HPI  Allergies  Allergen Reactions   Codeine     REACTION: rash/nausea/vomiting   Influenza Vaccines     injections site turned black color and had a hen size egg lump at site. Per pt she said not sure if the reactions was from a spider bite or flu shot.     Sulfa  Antibiotics Nausea And Vomiting   Past Medical History:  Diagnosis Date   Abnormal Pap smear of cervix    Arthritis    toe , finger, both hips   Atrial fibrillation (HCC)    [place]- [place] episodes   Blood transfusion without reported diagnosis    Bone spur 09/2017   Left hip   Endometriosis    Hx of adenomatous colonic polyps    Hx of removal of cyst    right ear   Hyperlipidemia    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   WBC 3,000 08/30/11 52 P 43 L   Migraine without aura    pas thx   Vitamin D  deficiency     Current Outpatient Medications:    Calcium  Citrate (CITRACAL PO), Take 1 capsule by mouth daily., Disp: , Rfl:    Calcium  Magnesium Zinc 333-133-5 MG TABS, Take by mouth., Disp: , Rfl:    cetirizine  (ZYRTEC  ALLERGY) 10 MG tablet,  Take 1 tablet (10 mg total) by mouth daily., Disp: 90 tablet, Rfl: 1   fluticasone  (FLONASE ) 50 MCG/ACT nasal spray, Place 2 sprays into both nostrils daily., Disp: 16 g, Rfl: 6   multivitamin-lutein (OCUVITE-LUTEIN) CAPS capsule, Take 1 capsule by mouth daily., Disp: , Rfl:    Probiotic Product (PROBIOTIC DAILY PO), Take 1 capsule by mouth daily. Flogen, Disp: , Rfl:    Vitamin D , Ergocalciferol , (DRISDOL ) 1.25 MG (50000 UNIT) CAPS capsule, Take 1 capsule (50,000 Units total) by mouth every 7 (seven) days., Disp: 12 capsule, Rfl: 4   meclizine  (ANTIVERT ) 25 MG tablet, Take 1 tablet (25 mg total) by mouth 3 (three) times daily as needed for dizziness. (Patient not taking: Reported on 08/05/2024), Disp: 30 tablet, Rfl: 0   triamcinolone  cream (KENALOG ) 0.1 %, Apply 1 Application topically 2 (two) times daily as needed (ear dermatitis). X7-10 days (Patient not taking: Reported on 08/05/2024), Disp: 30 g, Rfl: 0 Social History   Socioeconomic History   Marital status: Married    Spouse name: Oneil   Number of children: Not on file   Years of education: Not on file   Highest education level: Associate degree: occupational, scientist, product/process development, or vocational program  Occupational History   Not on file  Tobacco Use   Smoking status: Never   Smokeless tobacco: Never  Vaping Use   Vaping status: Never Used  Substance and Sexual Activity   Alcohol use: No   Drug use: No   Sexual activity: Yes    Partners: Male    Birth control/protection: Surgical    Comment: Hysterectomy  Other Topics Concern   Not on file  Social History Narrative   Married to [place].  Her daughter is expecting a child in the next couple of days.  They will have a total of 2 grandchildren, both girls.   Social Drivers of Corporate Investment Banker Strain: Low Risk  (07/22/2024)   Overall Financial Resource Strain (CARDIA)    Difficulty of Paying Living Expenses: Not hard at all  Food Insecurity: No Food Insecurity (07/22/2024)    Hunger Vital Sign    Worried About Running Out of Food in the Last Year: Never true    Ran Out of Food in the Last Year: Never true  Transportation Needs: No Transportation Needs (07/22/2024)   PRAPARE - Administrator, Civil Service (Medical): No    Lack of Transportation (Non-Medical): No  Physical Activity: Sufficiently Active (07/22/2024)   Exercise Vital Sign    Days of Exercise per Week: 4 days    Minutes of Exercise per Session: 60 min  Stress: No Stress Concern Present (07/22/2024)   [employer] of Occupational Health - Occupational Stress Questionnaire    Feeling of Stress: Not at all  Social Connections: Socially Integrated (07/22/2024)   Social Connection and Isolation Panel    Frequency of Communication with Friends and Family: More than three times a week    Frequency of Social Gatherings with Friends and Family: More than three times a week    Attends Religious Services: More than 4 times per year    Active Member of [employer] or Organizations: Yes    Attends Banker Meetings: Patient declined    Marital Status: Married  Catering Manager Violence: Not on file   Family History  Problem Relation Age of Onset   Diabetes Mother    Osteoporosis Mother    Hypertension Mother    Colon cancer Father 12   Colon polyps Father    Stomach cancer Paternal Apolinar Gavel' disease Daughter    Breast cancer Paternal Aunt        71's   Cancer Paternal Aunt        breast   Esophageal cancer Neg Hx    Rectal cancer Neg Hx     Objective: Office vital signs reviewed. BP 115/71   Pulse 80   Temp 97.7 F (36.5 C)   Ht 5' 4.5 (1.638 m)   Wt 162 lb (73.5 kg)   LMP 08/18/1989   SpO2 94%   BMI 27.38 kg/m   Physical Examination:  General: Awake, alert, well nourished, No acute distress HEENT: Normal    Neck: No masses palpated. No lymphadenopathy    Ears: Tympanic membranes intact, normal light reflex, no erythema, no bulging.  Slight clear  effusion noted bilaterally.  Has a slight hemotympanums on the right but no external auditory inflammation or erythema or exudates    Assessment/ Plan: 73 y.o. female   Eustachian tube disorder, bilateral   Suspect ongoing eustachian tube dysfunction after infection.  We discussed that this simply takes time to resolve.  Continue oral antihistamine, nasal spray and  Amsidine maneuver for eustachian tube drainage.  She will follow-up as needed   Norene CHRISTELLA Fielding, DO [facility] [phone]

## 2024-08-15 ENCOUNTER — Emergency Department (HOSPITAL_BASED_OUTPATIENT_CLINIC_OR_DEPARTMENT_OTHER)

## 2024-08-15 ENCOUNTER — Observation Stay (HOSPITAL_BASED_OUTPATIENT_CLINIC_OR_DEPARTMENT_OTHER)
Admission: EM | Admit: 2024-08-15 | Discharge: 2024-08-17 | Disposition: A | Attending: Emergency Medicine | Admitting: Emergency Medicine

## 2024-08-15 ENCOUNTER — Encounter (HOSPITAL_BASED_OUTPATIENT_CLINIC_OR_DEPARTMENT_OTHER): Payer: Self-pay

## 2024-08-15 ENCOUNTER — Other Ambulatory Visit: Payer: Self-pay

## 2024-08-15 DIAGNOSIS — K449 Diaphragmatic hernia without obstruction or gangrene: Secondary | ICD-10-CM | POA: Diagnosis not present

## 2024-08-15 DIAGNOSIS — N390 Urinary tract infection, site not specified: Secondary | ICD-10-CM | POA: Diagnosis not present

## 2024-08-15 DIAGNOSIS — N200 Calculus of kidney: Principal | ICD-10-CM

## 2024-08-15 DIAGNOSIS — R10A0 Flank pain, unspecified side: Secondary | ICD-10-CM | POA: Diagnosis present

## 2024-08-15 DIAGNOSIS — E559 Vitamin D deficiency, unspecified: Secondary | ICD-10-CM | POA: Diagnosis not present

## 2024-08-15 DIAGNOSIS — R7303 Prediabetes: Secondary | ICD-10-CM | POA: Diagnosis not present

## 2024-08-15 DIAGNOSIS — D696 Thrombocytopenia, unspecified: Secondary | ICD-10-CM | POA: Diagnosis not present

## 2024-08-15 DIAGNOSIS — N281 Cyst of kidney, acquired: Secondary | ICD-10-CM | POA: Diagnosis not present

## 2024-08-15 DIAGNOSIS — I480 Paroxysmal atrial fibrillation: Secondary | ICD-10-CM | POA: Diagnosis present

## 2024-08-15 DIAGNOSIS — E785 Hyperlipidemia, unspecified: Secondary | ICD-10-CM | POA: Diagnosis not present

## 2024-08-15 DIAGNOSIS — N202 Calculus of kidney with calculus of ureter: Secondary | ICD-10-CM | POA: Insufficient documentation

## 2024-08-15 DIAGNOSIS — D649 Anemia, unspecified: Secondary | ICD-10-CM | POA: Insufficient documentation

## 2024-08-15 DIAGNOSIS — N132 Hydronephrosis with renal and ureteral calculous obstruction: Secondary | ICD-10-CM | POA: Diagnosis not present

## 2024-08-15 LAB — URINALYSIS, ROUTINE W REFLEX MICROSCOPIC
Bacteria, UA: NONE SEEN
Bilirubin Urine: NEGATIVE
Glucose, UA: NEGATIVE mg/dL
Nitrite: NEGATIVE
Protein, ur: 30 mg/dL — AB
RBC / HPF: >50 RBC/hpf (ref 0–5)
Specific Gravity, Urine: 1.028 (ref 1.005–1.030)
WBC, UA: >50 WBC/hpf (ref 0–5)
pH: 5.5 (ref 5.0–8.0)

## 2024-08-15 LAB — BASIC METABOLIC PANEL WITH GFR
Anion gap: 13 (ref 5–15)
BUN: 17 mg/dL (ref 8–23)
CO2: 27 mmol/L (ref 22–32)
Calcium: 10.2 mg/dL (ref 8.9–10.3)
Chloride: 103 mmol/L (ref 98–111)
Creatinine, Ser: 1.14 mg/dL — ABNORMAL HIGH (ref 0.44–1.00)
GFR, Estimated: 51 mL/min — ABNORMAL LOW (ref >60)
Glucose, Bld: 153 mg/dL — ABNORMAL HIGH (ref 70–99)
Potassium: 3.9 mmol/L (ref 3.5–5.1)
Sodium: 142 mmol/L (ref 135–145)

## 2024-08-15 LAB — CBC
HCT: 37.4 % (ref 36.0–46.0)
Hemoglobin: 12.7 g/dL (ref 12.0–15.0)
MCH: 32.4 pg (ref 26.0–34.0)
MCHC: 34 g/dL (ref 30.0–36.0)
MCV: 95.4 fL (ref 80.0–100.0)
Platelets: 198 K/uL (ref 150–400)
RBC: 3.92 MIL/uL (ref 3.87–5.11)
RDW: 12.1 % (ref 11.5–15.5)
WBC: 6.3 K/uL (ref 4.0–10.5)
nRBC: 0 % (ref 0.0–0.2)

## 2024-08-15 MED ORDER — ONDANSETRON HCL 4 MG/2ML IJ SOLN
4.0000 mg | Freq: Once | INTRAMUSCULAR | Status: AC
  Administered 2024-08-15: 4 mg via INTRAVENOUS
  Filled 2024-08-15: qty 2

## 2024-08-15 MED ORDER — FENTANYL CITRATE (PF) 50 MCG/ML IJ SOSY
50.0000 ug | PREFILLED_SYRINGE | INTRAMUSCULAR | Status: DC | PRN
  Administered 2024-08-15: 50 ug via INTRAVENOUS
  Filled 2024-08-15: qty 1

## 2024-08-15 NOTE — ED Provider Notes (Signed)
 [place] EMERGENCY DEPARTMENT AT [facility] Provider Note   CSN: [id number] Arrival date & time: 08/15/24  2103     Patient presents with: Flank Pain   TIAN DAVISON is a 73 y.o. female.  {Add pertinent medical, surgical, social history, OB history to HPI:32947}  Flank Pain       Prior to Admission medications   Medication Sig Start Date End Date Taking? Authorizing Provider  Calcium  Citrate (CITRACAL PO) Take 1 capsule by mouth daily.    [provider]  Calcium  Magnesium Zinc 333-133-5 MG TABS Take by mouth.    [provider]  cetirizine  (ZYRTEC  ALLERGY) 10 MG tablet Take 1 tablet (10 mg total) by mouth daily. 06/23/24   Lavell Lye A, FNP  fluticasone  (FLONASE ) 50 MCG/ACT nasal spray Place 2 sprays into both nostrils daily. 06/23/24   Lavell Lye LABOR, FNP  meclizine  (ANTIVERT ) 25 MG tablet Take 1 tablet (25 mg total) by mouth 3 (three) times daily as needed for dizziness. Patient not taking: Reported on 08/05/2024 06/23/24   Lavell Lye A, FNP  multivitamin-lutein (OCUVITE-LUTEIN) CAPS capsule Take 1 capsule by mouth daily.    [provider]  Probiotic Product (PROBIOTIC DAILY PO) Take 1 capsule by mouth daily. Flogen    [provider]  triamcinolone  cream (KENALOG ) 0.1 % Apply 1 Application topically 2 (two) times daily as needed (ear dermatitis). X7-10 days Patient not taking: Reported on 08/05/2024 12/18/23   [name], DO  Vitamin D , Ergocalciferol , (DRISDOL ) 1.25 MG (50000 UNIT) CAPS capsule Take 1 capsule (50,000 Units total) by mouth every 7 (seven) days. 12/18/23   [name], DO    Allergies: Codeine, Influenza vaccines, and Sulfa  antibiotics    Review of Systems  Genitourinary:  Positive for flank pain.    Updated Vital Signs BP (!) 178/87 (BP Location: Right Arm)   Pulse 76   Temp 98 F (36.7 C) (Oral)   Resp 17   Ht 5' 5 (1.651 m)   Wt 71.2 kg   LMP 08/18/1989   SpO2 99%   BMI  26.13 kg/m   Physical Exam  (all labs ordered are listed, but only abnormal results are displayed) Labs Reviewed  URINALYSIS, ROUTINE W REFLEX MICROSCOPIC - Abnormal; Notable for the following components:      Result Value   Color, Urine ORANGE (*)    APPearance CLOUDY (*)    Hgb urine dipstick LARGE (*)    Ketones, ur TRACE (*)    Protein, ur 30 (*)    Leukocytes,Ua TRACE (*)    All other components within normal limits  BASIC METABOLIC PANEL WITH GFR - Abnormal; Notable for the following components:   Glucose, Bld 153 (*)    Creatinine, Ser 1.14 (*)    GFR, Estimated 51 (*)    All other components within normal limits  CBC    EKG: None  Radiology: No results found.  {Document cardiac monitor, telemetry assessment procedure when appropriate:32947} Procedures   Medications Ordered in the ED - No data to display    {Click here for ABCD2, HEART and other calculators REFRESH Note before signing:1}                              Medical Decision Making  ***  {Document critical care time when appropriate  Document review of labs and clinical decision tools ie CHADS2VASC2, etc  Document your independent review of  radiology images and any outside records  Document your discussion with family members, caretakers and with consultants  Document social determinants of health affecting pt's care  Document your decision making why or why not admission, treatments were needed:32947:::1}   Final diagnoses:  None    ED Discharge Orders     None

## 2024-08-15 NOTE — ED Triage Notes (Signed)
 Pt to triage c/o N/V with left side flank pain 10/10 x 12 hours sharp in nature with no elevating factor. Pt state urine has been darker over last 24 hours.VSS NAD PT on room air.

## 2024-08-16 DIAGNOSIS — N390 Urinary tract infection, site not specified: Secondary | ICD-10-CM

## 2024-08-16 DIAGNOSIS — E559 Vitamin D deficiency, unspecified: Secondary | ICD-10-CM | POA: Diagnosis not present

## 2024-08-16 DIAGNOSIS — E785 Hyperlipidemia, unspecified: Secondary | ICD-10-CM | POA: Diagnosis not present

## 2024-08-16 DIAGNOSIS — N132 Hydronephrosis with renal and ureteral calculous obstruction: Secondary | ICD-10-CM | POA: Diagnosis not present

## 2024-08-16 DIAGNOSIS — N200 Calculus of kidney: Secondary | ICD-10-CM | POA: Diagnosis not present

## 2024-08-16 DIAGNOSIS — I959 Hypotension, unspecified: Secondary | ICD-10-CM | POA: Diagnosis not present

## 2024-08-16 DIAGNOSIS — Z743 Need for continuous supervision: Secondary | ICD-10-CM | POA: Diagnosis not present

## 2024-08-16 DIAGNOSIS — R109 Unspecified abdominal pain: Secondary | ICD-10-CM | POA: Diagnosis not present

## 2024-08-16 DIAGNOSIS — D649 Anemia, unspecified: Secondary | ICD-10-CM | POA: Diagnosis not present

## 2024-08-16 DIAGNOSIS — I480 Paroxysmal atrial fibrillation: Secondary | ICD-10-CM | POA: Diagnosis not present

## 2024-08-16 DIAGNOSIS — N202 Calculus of kidney with calculus of ureter: Secondary | ICD-10-CM | POA: Diagnosis not present

## 2024-08-16 DIAGNOSIS — D696 Thrombocytopenia, unspecified: Secondary | ICD-10-CM | POA: Diagnosis not present

## 2024-08-16 DIAGNOSIS — R7303 Prediabetes: Secondary | ICD-10-CM | POA: Diagnosis not present

## 2024-08-16 LAB — MAGNESIUM: Magnesium: 2.3 mg/dL (ref 1.7–2.4)

## 2024-08-16 MED ORDER — LACTATED RINGERS IV SOLN: INTRAVENOUS | Status: DC

## 2024-08-16 MED ORDER — OXYCODONE-ACETAMINOPHEN 5-325 MG PO TABS
1.0000 | ORAL_TABLET | Freq: Once | ORAL | Status: DC
Start: 2024-08-16 — End: 2024-08-16
  Filled 2024-08-16: qty 1

## 2024-08-16 MED ORDER — HYDROMORPHONE HCL 1 MG/ML IJ SOLN: 0.5000 mg | INTRAMUSCULAR | Status: DC | PRN

## 2024-08-16 MED ORDER — SODIUM CHLORIDE 0.9 % IV SOLN
1.0000 g | Freq: Once | INTRAVENOUS | Status: AC
  Administered 2024-08-16: 1 g via INTRAVENOUS
  Filled 2024-08-16: qty 10

## 2024-08-16 MED ORDER — ONDANSETRON HCL 4 MG/2ML IJ SOLN: 4.0000 mg | Freq: Four times a day (QID) | INTRAMUSCULAR | Status: DC | PRN

## 2024-08-16 MED ORDER — FENTANYL CITRATE (PF) 50 MCG/ML IJ SOSY
50.0000 ug | PREFILLED_SYRINGE | INTRAMUSCULAR | Status: DC | PRN
  Administered 2024-08-16: 50 ug via INTRAVENOUS
  Filled 2024-08-16: qty 1

## 2024-08-16 MED ORDER — TAMSULOSIN HCL 0.4 MG PO CAPS: 0.4000 mg | ORAL_CAPSULE | Freq: Every day | ORAL | 0 refills | Status: DC

## 2024-08-16 MED ORDER — KETOROLAC TROMETHAMINE 15 MG/ML IJ SOLN
15.0000 mg | Freq: Once | INTRAMUSCULAR | Status: AC
  Administered 2024-08-16: 15 mg via INTRAVENOUS
  Filled 2024-08-16: qty 1

## 2024-08-16 MED ORDER — ACETAMINOPHEN 650 MG RE SUPP: 650.0000 mg | Freq: Four times a day (QID) | RECTAL | Status: DC | PRN

## 2024-08-16 MED ORDER — KETOROLAC TROMETHAMINE 10 MG PO TABS: 10.0000 mg | ORAL_TABLET | Freq: Four times a day (QID) | ORAL | 0 refills | Status: DC | PRN

## 2024-08-16 MED ORDER — AMOXICILLIN-POT CLAVULANATE 875-125 MG PO TABS: 1.0000 | ORAL_TABLET | Freq: Two times a day (BID) | ORAL | 0 refills | Status: DC

## 2024-08-16 MED ORDER — TAMSULOSIN HCL 0.4 MG PO CAPS
0.4000 mg | ORAL_CAPSULE | Freq: Once | ORAL | Status: AC
  Administered 2024-08-17: 0.4 mg via ORAL
  Filled 2024-08-16: qty 1

## 2024-08-16 MED ORDER — ACETAMINOPHEN 325 MG PO TABS: 650.0000 mg | ORAL_TABLET | Freq: Four times a day (QID) | ORAL | Status: DC | PRN

## 2024-08-16 MED ORDER — SENNOSIDES-DOCUSATE SODIUM 8.6-50 MG PO TABS: 1.0000 | ORAL_TABLET | Freq: Every evening | ORAL | Status: DC | PRN

## 2024-08-16 MED ORDER — SODIUM CHLORIDE 0.9% FLUSH: 3.0000 mL | Freq: Two times a day (BID) | INTRAVENOUS | Status: DC

## 2024-08-16 MED ORDER — SODIUM CHLORIDE 0.9 % IV SOLN
1.0000 g | INTRAVENOUS | Status: DC
  Administered 2024-08-17: 1 g via INTRAVENOUS
  Filled 2024-08-16: qty 10

## 2024-08-16 MED ORDER — TAMSULOSIN HCL 0.4 MG PO CAPS
0.4000 mg | ORAL_CAPSULE | Freq: Once | ORAL | Status: AC
  Administered 2024-08-16: 0.4 mg via ORAL
  Filled 2024-08-16: qty 1

## 2024-08-16 MED ORDER — KETOROLAC TROMETHAMINE 15 MG/ML IJ SOLN
15.0000 mg | Freq: Four times a day (QID) | INTRAMUSCULAR | Status: DC | PRN
  Administered 2024-08-16 (×2): 15 mg via INTRAVENOUS
  Filled 2024-08-16 (×2): qty 1

## 2024-08-16 MED ORDER — ONDANSETRON HCL 4 MG PO TABS: 4.0000 mg | ORAL_TABLET | Freq: Four times a day (QID) | ORAL | Status: DC | PRN

## 2024-08-16 MED ORDER — ONDANSETRON 4 MG PO TBDP: 4.0000 mg | ORAL_TABLET | Freq: Three times a day (TID) | ORAL | 0 refills | Status: DC | PRN

## 2024-08-16 MED ORDER — TRAMADOL HCL 50 MG PO TABS: 50.0000 mg | ORAL_TABLET | Freq: Four times a day (QID) | ORAL | 0 refills | Status: DC | PRN

## 2024-08-16 NOTE — ED Notes (Signed)
-  Called [facility] for transportation to [id number].

## 2024-08-16 NOTE — TOC Initial Note (Signed)
 Transition of Care [facility]) - Initial/Assessment Note    Patient Details  Name: Krista Henderson MRN: [id number] Date of Birth: November 03, 1950  Transition of Care [facility]) CM/SW Contact:    Sonda Manuella Quill, RN Phone Number: 08/16/2024, 1:21 PM  Clinical Narrative:                 Krista Henderson w/ pt and spouse Krista Henderson [phone]) in room; pt said she lives at home; she plans to return w/ his support at d/c; he will provide transportation; pt verified insurance/PCP; she denied SDOH risks; pt does not have DME, HH services, or home oxygen; IP CM will follow.  Expected Discharge Plan: Home/Self Care Barriers to Discharge: Continued Medical Work up   Patient Goals and CMS Choice Patient states their goals for this hospitalization and ongoing recovery are:: home     [facility] ownership interest in [facility]provided to:: Patient    Expected Discharge Plan and Services   Discharge Planning Services: CM Consult   Living arrangements for the past 2 months: Single Family Home                 DME Arranged: N/A DME Agency: NA       HH Arranged: NA HH Agency: NA        Prior Living Arrangements/Services Living arrangements for the past 2 months: Single Family Home Lives with:: Spouse Patient language and need for interpreter reviewed:: Yes Do you feel safe going back to the place where you live?: Yes      Need for Family Participation in Patient Care: Yes (Comment) Care giver support system in place?: Yes (comment) Current home services:  (n/a) Criminal Activity/Legal Involvement Pertinent to Current Situation/Hospitalization: No - Comment as needed  Activities of Daily Living   ADL Screening (condition at time of admission) Independently performs ADLs?: Yes (appropriate for developmental age) Is the patient deaf or have difficulty hearing?: No Does the patient have difficulty seeing, even when wearing glasses/contacts?: No Does the patient have difficulty  concentrating, remembering, or making decisions?: No  Permission Sought/Granted Permission sought to share information with : Case Manager Permission granted to share information with : Yes, Verbal Permission Granted  Share Information with NAME: Case Manager     Permission granted to share info w Relationship: [name] (spouse) [phone]     Emotional Assessment Appearance:: Appears stated age Attitude/Demeanor/Rapport: Gracious Affect (typically observed): Accepting Orientation: : Oriented to Self, Oriented to Place, Oriented to  Time, Oriented to Situation Alcohol / Substance Use: Not Applicable Psych Involvement: No (comment)  Admission diagnosis:  Kidney stone [N20.0] Complicated UTI (urinary tract infection) [N39.0] Complicated urinary tract infection [N39.0] Patient Active Problem List   Diagnosis Date Noted   Complicated UTI (urinary tract infection) 08/16/2024   Complicated urinary tract infection 08/16/2024   Plantar wart, left foot 12/18/2023   Prediabetes 12/12/2022   H/O: hysterectomy 09/06/2021   Osteopenia with high risk of fracture 09/06/2021   Paroxysmal A-fib (HCC) 04/01/2021   Shingles 05/11/2014   Vitamin D  deficiency 09/02/2013   Hyperlipidemia 09/02/2013   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   PCP:  [name], DO Pharmacy:   [facility] [phone] - [place] [place] - [address] [place] [address] Phone: [phone] Fax: [phone]  [id number] [place], [place] - [address] AT [facility] [address] [place] [address] Phone: [phone] Fax: [phone]     Social Drivers of Health (  SDOH) Social History: SDOH Screenings   Food Insecurity: No Food Insecurity (08/16/2024)  Housing: Low Risk  (08/16/2024)  Transportation Needs: No Transportation Needs (08/16/2024)  Utilities: Not At Risk (08/16/2024)  Depression (PHQ2-9): Low Risk  (08/05/2024)   Financial Resource Strain: Low Risk  (07/22/2024)  Physical Activity: Sufficiently Active (07/22/2024)  Social Connections: Socially Integrated (08/16/2024)  Stress: No Stress Concern Present (07/22/2024)  Tobacco Use: Low Risk  (08/15/2024)   SDOH Interventions: Food Insecurity Interventions: Intervention Not Indicated, Inpatient TOC Housing Interventions: Intervention Not Indicated, Inpatient TOC Transportation Interventions: Intervention Not Indicated, Inpatient TOC Utilities Interventions: Intervention Not Indicated, Inpatient TOC   Readmission Risk Interventions     No data to display

## 2024-08-16 NOTE — Progress Notes (Signed)
 Pt care assumed by previous RN at 1300 this shift. Previous RN documentation has been reviewed. Pt stable and comfortable at this time. Continue plan of care

## 2024-08-16 NOTE — Consult Note (Signed)
 H&P Physician requesting consult: Krista Henderson  Chief Complaint: Left ureteral calculus  History of Present Illness: 73 year old female presented to the emergency department with nausea, vomiting, severe left-sided flank pain.  She had a apparently previously endorsed dysuria but denies this on my evaluation.  She did have some gross hematuria.  She denies any fever, chill at home.  CT scan was performed that showed a 4 mm proximal left ureteral calculus with upstream hydronephrosis.  She had a tiny nonobstructing calculus in the left kidney as well.  Upon my evaluation, she denied any significant pain or discomfort.  She denied any nausea or vomiting.  This is her first stone.  In the emergency department, had mild renal insufficiency with creatinine 1.14 from baseline of 0.88.  No leukocytosis.  Urinalysis with trace leukocyte, negative nitrite, no bacteria, greater than 50 WBC.  Past Medical History:  Diagnosis Date   Abnormal Pap smear of cervix    Arthritis    toe , finger, both hips   Atrial fibrillation (HCC)    [place]   Blood transfusion without reported diagnosis    Bone spur 09/2017   Left hip   Endometriosis    Hx of adenomatous colonic polyps    Hx of removal of cyst    right ear   Hyperlipidemia    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   WBC 3,000 08/30/11 52 P 43 L   Migraine without aura    pas thx   Vitamin D  deficiency    Past Surgical History:  Procedure Laterality Date   APPENDECTOMY  1965   7th grade in the '60's   CESAREAN SECTION     x2 '84 & '85   DIAGNOSTIC LAPAROSCOPY     TOTAL ABDOMINAL HYSTERECTOMY W/ BILATERAL SALPINGOOPHORECTOMY  08/1989   endometriosis    Home Medications:  Medications Prior to Admission  Medication Sig Dispense Refill Last Dose/Taking   Calcium  Citrate (CITRACAL PO) Take 1 capsule by mouth daily.   Past Week   Calcium  Magnesium Zinc 333-133-5 MG TABS Take 1 tablet by mouth daily.    08/15/2024   cetirizine  (ZYRTEC  ALLERGY) 10 MG tablet Take 1 tablet (10 mg total) by mouth daily. 90 tablet 1 08/15/2024   fluticasone  (FLONASE ) 50 MCG/ACT nasal spray Place 2 sprays into both nostrils daily. 16 g 6 Past Week   Probiotic Product (PROBIOTIC DAILY PO) Take 1 capsule by mouth daily. Flogen   08/15/2024   Vitamin D , Ergocalciferol , (DRISDOL ) 1.25 MG (50000 UNIT) CAPS capsule Take 1 capsule (50,000 Units total) by mouth every 7 (seven) days. 12 capsule 4 08/15/2024   meclizine  (ANTIVERT ) 25 MG tablet Take 1 tablet (25 mg total) by mouth 3 (three) times daily as needed for dizziness. (Patient not taking: Reported on 08/05/2024) 30 tablet 0 Not Taking   multivitamin-lutein (OCUVITE-LUTEIN) CAPS capsule Take 1 capsule by mouth daily. (Patient not taking: Reported on 08/16/2024)   Not Taking   triamcinolone  cream (KENALOG ) 0.1 % Apply 1 Application topically 2 (two) times daily as needed (ear dermatitis). X7-10 days (Patient not taking: Reported on 08/05/2024) 30 g 0 Not Taking   Allergies:  Allergies  Allergen Reactions   Codeine     REACTION: rash/nausea/vomiting   Influenza Vaccines     injections site turned black color and had a hen size egg lump at site. Per pt she said not sure if the reactions was from a spider bite or flu shot.  Sulfa  Antibiotics Nausea And Vomiting    Family History  Problem Relation Age of Onset   Diabetes Mother    Osteoporosis Mother    Hypertension Mother    Colon cancer Father 8   Colon polyps Father    Stomach cancer Paternal Apolinar Gavel' disease Daughter    Breast cancer Paternal Aunt        14's   Cancer Paternal Aunt        breast   Esophageal cancer Neg Hx    Rectal cancer Neg Hx    Social History:  reports that she has never smoked. She has never used smokeless tobacco. She reports that she does not drink alcohol and does not use drugs.  ROS: A complete review of systems was performed.  All systems are negative except  for pertinent findings as noted. ROS   Physical Exam:  Vital signs in last 24 hours: Temp:  [98 F (36.7 C)-99 F (37.2 C)] 98.1 F (36.7 C) (11/29 1230) Pulse Rate:  [66-81] 73 (11/29 1230) Resp:  [16-20] 18 (11/29 1230) BP: (109-178)/(60-87) 112/60 (11/29 1230) SpO2:  [93 %-99 %] 96 % (11/29 1230) Weight:  [71.2 kg] 71.2 kg (11/28 2113) General:  Alert and oriented, No acute distress HEENT: Normocephalic, atraumatic Neck: No JVD or lymphadenopathy Cardiovascular: Regular rate and rhythm Lungs: Regular rate and effort Abdomen: Soft, nontender, nondistended, no abdominal masses Back: No CVA tenderness Extremities: No edema Neurologic: Grossly intact  Laboratory Data:  Results for orders placed or performed during the hospital encounter of 08/15/24 (from the past 24 hours)  Urinalysis, Routine w reflex microscopic -Urine, Clean Catch     Status: Abnormal   Collection Time: 08/15/24  9:25 PM  Result Value Ref Range   Color, Urine ORANGE (A) YELLOW   APPearance CLOUDY (A) CLEAR   Specific Gravity, Urine 1.028 1.005 - 1.030   pH 5.5 5.0 - 8.0   Glucose, UA NEGATIVE NEGATIVE mg/dL   Hgb urine dipstick LARGE (A) NEGATIVE   Bilirubin Urine NEGATIVE NEGATIVE   Ketones, ur TRACE (A) NEGATIVE mg/dL   Protein, ur 30 (A) NEGATIVE mg/dL   Nitrite NEGATIVE NEGATIVE   Leukocytes,Ua TRACE (A) NEGATIVE   RBC / HPF >50 0 - 5 RBC/hpf   WBC, UA >50 0 - 5 WBC/hpf   Bacteria, UA NONE SEEN NONE SEEN   Squamous Epithelial / HPF 0-5 0 - 5 /HPF   Mucus PRESENT   Basic metabolic panel     Status: Abnormal   Collection Time: 08/15/24  9:25 PM  Result Value Ref Range   Sodium 142 135 - 145 mmol/L   Potassium 3.9 3.5 - 5.1 mmol/L   Chloride 103 98 - 111 mmol/L   CO2 27 22 - 32 mmol/L   Glucose, Bld 153 (H) 70 - 99 mg/dL   BUN 17 8 - 23 mg/dL   Creatinine, Ser 8.85 (H) 0.44 - 1.00 mg/dL   Calcium  10.2 8.9 - 10.3 mg/dL   GFR, Estimated 51 (L) >60 mL/min   Anion gap 13 5 - 15  CBC      Status: None   Collection Time: 08/15/24  9:25 PM  Result Value Ref Range   WBC 6.3 4.0 - 10.5 K/uL   RBC 3.92 3.87 - 5.11 MIL/uL   Hemoglobin 12.7 12.0 - 15.0 g/dL   HCT 62.5 63.9 - 53.9 %   MCV 95.4 80.0 - 100.0 fL   MCH 32.4 26.0 - 34.0 pg  MCHC 34.0 30.0 - 36.0 g/dL   RDW 87.8 88.4 - 84.4 %   Platelets 198 150 - 400 K/uL   nRBC 0.0 0.0 - 0.2 %  Magnesium     Status: None   Collection Time: 08/16/24  7:30 AM  Result Value Ref Range   Magnesium 2.3 1.7 - 2.4 mg/dL   No results found for this or any previous visit (from the past 240 hours). Creatinine: Recent Labs    08/15/24 2125  CREATININE 1.14*   CT scan personally reviewed and is detailed in the history of present illness  Impression/Assessment:  Left ureteral calculus Left ureteral obstruction secondary to calculus  Plan:  Urinalysis with trace leukocyte, negative nitrite, no bacteria seen.  She reports no fever or dysuria to me.  Doubt that she has a urinary tract infection.  Symptoms more consistent with obstructing stone without UTI.  However, we can continue antibiotics for now and follow-up on the culture.  I told her I am okay with discharge with Augmentin , Flomax, antiemetic, and pain medication.  I sent all of that to the pharmacy.  I did present the option of ureteroscopy today.  Also discussed the option of trial passage with plans for potentially ESWL outpatient if unable to pass.  She would like to trial passage and then see us  in clinic on Monday or Tuesday with a KUB and planning for continued passage versus ESWL.  Sherwood JONETTA Edison, III 08/16/2024, 2:32 PM

## 2024-08-16 NOTE — H&P (Signed)
 History and Physical    Krista Henderson FMW:[id number] DOB: 03/22/1951 DOA: 08/15/2024  DOS: the patient was seen and examined on 08/15/2024  PCP: [name], DO   Patient coming from: Home  I have personally briefly reviewed patient's old medical records in [facility] and CareEverywhere  HPI:   Krista Henderson is a 73 y.o. year old female with medical history of hyperlipidemia, vitamin D  deficiency, paroxysmal A-fib presented to the ED with nausea and vomiting along with left flank pain along with concern for darkening urine.   Pt states she developed left-sided flank pain that radiated down her groin and developed dysuria along with nausea.  This development of severe pain prompting her to come to the ED for evaluation.  She denies any sick contacts and states she had chills but no fevers.  She denies any coughing, sore throat or myalgias.   On arrival to the ED patient was noted to be HDS stable.  Lab work and imaging obtained.  CBC without leukocytosis or anemia.  BMP with significant hyperglycemia, and slight elevation in her creatinine.  UA with trace leukocytes with significant RBC and WBC.   [facility] contacted for admission.  Review of Systems: As mentioned in the history of present illness. All other systems reviewed and are negative.   Past Medical History:  Diagnosis Date   Abnormal Pap smear of cervix    Arthritis    toe , finger, both hips   Atrial fibrillation (HCC)    [place] episodes   Blood transfusion without reported diagnosis    Bone spur 09/2017   Left hip   Endometriosis    Hx of adenomatous colonic polyps    Hx of removal of cyst    right ear   Hyperlipidemia    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   WBC 3,000 08/30/11 52 P 43 L   Migraine without aura    pas thx   Vitamin D  deficiency     Past Surgical History:  Procedure Laterality Date   APPENDECTOMY  1965   7th grade in the '60's   CESAREAN  SECTION     x2 '84 & '85   DIAGNOSTIC LAPAROSCOPY     TOTAL ABDOMINAL HYSTERECTOMY W/ BILATERAL SALPINGOOPHORECTOMY  08/1989   endometriosis     Allergies  Allergen Reactions   Codeine     REACTION: rash/nausea/vomiting   Influenza Vaccines     injections site turned black color and had a hen size egg lump at site. Per pt she said not sure if the reactions was from a spider bite or flu shot.     Sulfa  Antibiotics Nausea And Vomiting    Family History  Problem Relation Age of Onset   Diabetes Mother    Osteoporosis Mother    Hypertension Mother    Colon cancer Father 74   Colon polyps Father    Stomach cancer Paternal Apolinar Gavel' disease Daughter    Breast cancer Paternal Aunt        70's   Cancer Paternal Aunt        breast   Esophageal cancer Neg Hx    Rectal cancer Neg Hx    Only taking vitamins and no medications Prior to Admission medications   Medication Sig Start Date End Date Taking? Authorizing Provider  Calcium  Citrate (CITRACAL PO) Take 1 capsule by mouth daily.    [provider]  Calcium  Magnesium Zinc  333-133-5 MG TABS Take by mouth.    [provider]  cetirizine  (ZYRTEC  ALLERGY) 10 MG tablet Take 1 tablet (10 mg total) by mouth daily. 06/23/24   Lavell Bari LABOR, FNP  fluticasone  (FLONASE ) 50 MCG/ACT nasal spray Place 2 sprays into both nostrils daily. 06/23/24   Lavell Bari LABOR, FNP  meclizine  (ANTIVERT ) 25 MG tablet Take 1 tablet (25 mg total) by mouth 3 (three) times daily as needed for dizziness. Patient not taking: Reported on 08/05/2024 06/23/24   Lavell Bari A, FNP  multivitamin-lutein (OCUVITE-LUTEIN) CAPS capsule Take 1 capsule by mouth daily.    [provider]  Probiotic Product (PROBIOTIC DAILY PO) Take 1 capsule by mouth daily. Flogen    [provider]  triamcinolone  cream (KENALOG ) 0.1 % Apply 1 Application topically 2 (two) times daily as needed (ear dermatitis). X7-10 days Patient not  taking: Reported on 08/05/2024 12/18/23   [name], DO  Vitamin D , Ergocalciferol , (DRISDOL ) 1.25 MG (50000 UNIT) CAPS capsule Take 1 capsule (50,000 Units total) by mouth every 7 (seven) days. 12/18/23   [name], DO    Social History:  reports that she has never smoked. She has never used smokeless tobacco. She reports that she does not drink alcohol and does not use drugs. Lives with husband Tobacco- Denies use. EtOH- Denies use.  Illicit drug use- denies use.  IADLs/ADLs- can perform independently at baseline    Physical Exam: Vitals:   08/16/24 0130 08/16/24 0200 08/16/24 0245 08/16/24 0354  BP: 135/82 127/73 126/73 (!) 143/72  Pulse: 81 72 72   Resp:  18 16 18   Temp:   98.4 F (36.9 C) 99 F (37.2 C)  TempSrc:   Oral Oral  SpO2: 95% 98% 94% 97%  Weight:      Height:         Gen: NAD HENT: NCAT CV: Regular rate and rhythm Resp: CTAB Abd: TTP of left flank but no TTP MSK: No symmetry Skin: No lesions noted on skin Neuro: Alert and oriented x 4 Psych: Pleasant mood   Labs on Admission: I have personally reviewed following labs and imaging studies  CBC: Recent Labs  Lab 08/15/24 2125  WBC 6.3  HGB 12.7  HCT 37.4  MCV 95.4  PLT 198   Basic Metabolic Panel: Recent Labs  Lab 08/15/24 2125  NA 142  K 3.9  CL 103  CO2 27  GLUCOSE 153*  BUN 17  CREATININE 1.14*  CALCIUM  10.2   GFR: Estimated Creatinine Clearance: 43.5 mL/min (A) (by C-G formula based on SCr of 1.14 mg/dL (H)). Liver Function Tests: No results for input(s): AST, ALT, ALKPHOS, BILITOT, PROT, ALBUMIN in the last 168 hours. No results for input(s): LIPASE, AMYLASE in the last 168 hours. No results for input(s): AMMONIA in the last 168 hours. Coagulation Profile: No results for input(s): INR, PROTIME in the last 168 hours. Cardiac Enzymes: No results for input(s): CKTOTAL, CKMB, CKMBINDEX, TROPONINI, TROPONINIHS in the last 168  hours. BNP (last 3 results) No results for input(s): BNP in the last 8760 hours. HbA1C: No results for input(s): HGBA1C in the last 72 hours. CBG: No results for input(s): GLUCAP in the last 168 hours. Lipid Profile: No results for input(s): CHOL, HDL, LDLCALC, TRIG, CHOLHDL, LDLDIRECT in the last 72 hours. Thyroid  Function Tests: No results for input(s): TSH, T4TOTAL, FREET4, T3FREE, THYROIDAB in the last 72 hours. Anemia Panel: No results for input(s): VITAMINB12, FOLATE, FERRITIN, TIBC, IRON, RETICCTPCT in the  last 72 hours. Urine analysis:    Component Value Date/Time   COLORURINE ORANGE (A) 08/15/2024 2125   APPEARANCEUR CLOUDY (A) 08/15/2024 2125   APPEARANCEUR Clear 01/16/2019 1027   LABSPEC 1.028 08/15/2024 2125   PHURINE 5.5 08/15/2024 2125   GLUCOSEU NEGATIVE 08/15/2024 2125   HGBUR LARGE (A) 08/15/2024 2125   BILIRUBINUR NEGATIVE 08/15/2024 2125   BILIRUBINUR n 07/16/2020 1356   BILIRUBINUR Negative 01/16/2019 1027   KETONESUR TRACE (A) 08/15/2024 2125   PROTEINUR 30 (A) 08/15/2024 2125   UROBILINOGEN negative (A) 07/16/2020 1356   NITRITE NEGATIVE 08/15/2024 2125   LEUKOCYTESUR TRACE (A) 08/15/2024 2125    Radiological Exams on Admission: I have personally reviewed images CT Renal Stone Study Result Date: 08/15/2024 CLINICAL DATA:  Left flank pain EXAM: CT ABDOMEN AND PELVIS WITHOUT CONTRAST TECHNIQUE: Multidetector CT imaging of the abdomen and pelvis was performed following the standard protocol without IV contrast. RADIATION DOSE REDUCTION: This exam was performed according to the departmental dose-optimization program which includes automated exposure control, adjustment of the mA and/or kV according to patient size and/or use of iterative reconstruction technique. COMPARISON:  CT chest abdomen and pelvis 11/08/2021. FINDINGS: Lower chest: No acute abnormality. Hepatobiliary: No focal liver abnormality is seen. No gallstones,  gallbladder wall thickening, or biliary dilatation. Pancreas: Unremarkable. No pancreatic ductal dilatation or surrounding inflammatory changes. Spleen: Normal in size without focal abnormality. Adrenals/Urinary Tract: There is a 4 mm calculus in the proximal left ureter. There is mild left-sided hydronephrosis. There is a punctate calculus in the inferior pole of the left kidney. There is a cyst in the superior pole of the right kidney measuring 10 mm. Adrenal glands are within normal limits. The bladder is completely decompressed. Stomach/Bowel: There is a small hiatal hernia. Stomach is otherwise within normal limits. Appendix is not seen. No evidence of bowel wall thickening, distention, or inflammatory changes. Vascular/Lymphatic: Aortic atherosclerosis. No enlarged abdominal or pelvic lymph nodes. Reproductive: Status post hysterectomy. No adnexal masses. Other: No abdominal wall hernia or abnormality. No abdominopelvic ascites. Musculoskeletal: Degenerative changes affect the spine and hips. IMPRESSION: 1. 4 mm calculus in the proximal left ureter with mild obstructive uropathy. 2. Nonobstructing left renal calculus. 3. Small hiatal hernia. 4. Aortic atherosclerosis. Aortic Atherosclerosis (ICD10-I70.0). Electronically Signed   By: Greig Pique M.D.   On: 08/15/2024 23:48    EKG: My personal interpretation of EKG shows: Pending  Assessment/Plan Principal Problem:   Complicated UTI (urinary tract infection) Active Problems:   Vitamin D  deficiency   Paroxysmal A-fib (HCC)   Prediabetes   Patient with nephrolithiasis and urinary tract infection admitted for complicated UTI.  Urine culture pending.  Patient is status post ceftriaxone.  Urology consulted by EDP and will see the patient.  She has a 4 mm left ureter stone which means her pain.  She was given Flomax which we will continue along with IVF.  Will continue antiemetic and pain med as needed.  Patient does have combined calcium  and vitamin D   supplementation which can increase risk of stone formation.  Her serum calcium  is at the upper limit of normal.  Prediabetes: Annual A1c screening by PCP.  Currently lifestyle controlled.  Hyperlipidemia: Patient not on any medication but have not prescribed statin.  Discussed need for statin and prevention of cardiovascular diseases.  Paroxysmal A-fib: On chart review it appears this was a single episode in the setting of severe dehydration.  Advised patient to follow-up with PCP regarding this along with other chronic medical  problems.  VTE prophylaxis: SCD Diet: N.p.o. Code Status:  Full Code Telemetry:  Admission status: Observation, Telemetry bed Patient is from: Home Anticipated d/c is to: Home Anticipated d/c is in: 1-2 days   Family Communication: Updated at bedside  Consults called: Urology   Severity of Illness: The appropriate patient status for this patient is OBSERVATION. Observation status is judged to be reasonable and necessary in order to provide the required intensity of service to ensure the patient's safety. The patient's presenting symptoms, physical exam findings, and initial radiographic and laboratory data in the context of their medical condition is felt to place them at decreased risk for further clinical deterioration. Furthermore, it is anticipated that the patient will be medically stable for discharge from the hospital within 2 midnights of admission.    Morene Bathe, MD [facility] DEL. [facility]

## 2024-08-16 NOTE — H&P (View-Only) (Signed)
 Pt care assumed by previous RN at 1300 this shift. Previous RN documentation has been reviewed. Pt stable and comfortable at this time. Continue plan of care

## 2024-08-16 NOTE — ED Notes (Signed)
 Pt sat up to take PO meds. Pain increases to 7:10. Emesis x1. EDP made aware.

## 2024-08-16 NOTE — Progress Notes (Signed)
 Patient admitted earlier this morning for complicated UTI with left ureteral stone.  Patient seen and examined at bedside and plan of care discussed with her.  Awaiting urology evaluation.  Continue IV fluids and antibiotics.  Repeat a.m. labs and follow cultures.

## 2024-08-16 NOTE — Plan of Care (Signed)
 Plan of Care Note for accepted transfer   Patient name: Krista Henderson FMW:[id number] DOB: Sep 29, 1950  Facility requesting transfer: [facility] ED Requesting Provider: Dr. Jerrol Facility course: 73 year old female with history of A-fib not on anticoagulation, hyperlipidemia, history of hysterectomy and appendectomy presented to the ED with sudden onset left lower quadrant and left flank pain.  CT showing a 4 mm calculus in the proximal left ureter with mild obstructive uropathy.  No fever or leukocytosis, creatinine 1.14 (previously 0.88 on 06/12/2024).  UA with negative nitrite, trace leukocytes, >50 RBCs, >50 WBCs, no bacteria.  Patient was given Toradol , Zofran , and fentanyl.  She will be given a dose of ceftriaxone.  EDP discussed with urologist Dr. Watt who will see the patient in consultation.  Plan of care: The patient is accepted for admission to Telemetry unit at [facility].  [facility] will assume care on arrival to accepting facility. Until arrival, care as per EDP. However, [facility] available 24/7 for questions and assistance.  Check [email or web address] for on-call coverage.  Nursing staff, please call [facility] Admits & Consults System-Wide number under Amion on patient's arrival so appropriate admitting provider can evaluate the pt.

## 2024-08-16 NOTE — Care Management Obs Status (Signed)
 MEDICARE OBSERVATION STATUS NOTIFICATION   Patient Details  Name: Krista Henderson MRN: [id number] Date of Birth: 1950/11/05   Medicare Observation Status Notification Given:  Yes    Sonda Manuella Quill, RN 08/16/2024, 12:23 PM

## 2024-08-16 NOTE — Plan of Care (Signed)
   Problem: Education: Goal: Knowledge of General Education information will improve Description Including pain rating scale, medication(s)/side effects and non-pharmacologic comfort measures Outcome: Progressing   Problem: Health Behavior/Discharge Planning: Goal: Ability to manage health-related needs will improve Outcome: Progressing

## 2024-08-17 DIAGNOSIS — N390 Urinary tract infection, site not specified: Secondary | ICD-10-CM | POA: Diagnosis not present

## 2024-08-17 LAB — BASIC METABOLIC PANEL WITH GFR
Anion gap: 8 (ref 5–15)
BUN: 12 mg/dL (ref 8–23)
CO2: 26 mmol/L (ref 22–32)
Calcium: 8.7 mg/dL — ABNORMAL LOW (ref 8.9–10.3)
Chloride: 107 mmol/L (ref 98–111)
Creatinine, Ser: 0.82 mg/dL (ref 0.44–1.00)
GFR, Estimated: >60 mL/min (ref >60)
Glucose, Bld: 123 mg/dL — ABNORMAL HIGH (ref 70–99)
Potassium: 4.1 mmol/L (ref 3.5–5.1)
Sodium: 141 mmol/L (ref 135–145)

## 2024-08-17 LAB — CBC
HCT: 30.5 % — ABNORMAL LOW (ref 36.0–46.0)
Hemoglobin: 10.2 g/dL — ABNORMAL LOW (ref 12.0–15.0)
MCH: 32.7 pg (ref 26.0–34.0)
MCHC: 33.4 g/dL (ref 30.0–36.0)
MCV: 97.8 fL (ref 80.0–100.0)
Platelets: 123 K/uL — ABNORMAL LOW (ref 150–400)
RBC: 3.12 MIL/uL — ABNORMAL LOW (ref 3.87–5.11)
RDW: 12 % (ref 11.5–15.5)
WBC: 4 K/uL (ref 4.0–10.5)
nRBC: 0 % (ref 0.0–0.2)

## 2024-08-17 LAB — URINE CULTURE: Culture: NO GROWTH

## 2024-08-17 LAB — MAGNESIUM: Magnesium: 2.1 mg/dL (ref 1.7–2.4)

## 2024-08-17 LAB — GLUCOSE, CAPILLARY: Glucose-Capillary: 113 mg/dL — ABNORMAL HIGH (ref 70–99)

## 2024-08-17 MED ORDER — AMOXICILLIN-POT CLAVULANATE 875-125 MG PO TABS: 1.0000 | ORAL_TABLET | Freq: Two times a day (BID) | ORAL | 0 refills | Status: DC

## 2024-08-17 MED ORDER — TRAMADOL HCL 50 MG PO TABS: 50.0000 mg | ORAL_TABLET | Freq: Four times a day (QID) | ORAL | 0 refills | Status: DC | PRN

## 2024-08-17 MED ORDER — ONDANSETRON 4 MG PO TBDP: 4.0000 mg | ORAL_TABLET | Freq: Three times a day (TID) | ORAL | 0 refills | Status: DC | PRN

## 2024-08-17 MED ORDER — KETOROLAC TROMETHAMINE 10 MG PO TABS: 10.0000 mg | ORAL_TABLET | Freq: Four times a day (QID) | ORAL | 0 refills | Status: DC | PRN

## 2024-08-17 MED ORDER — TAMSULOSIN HCL 0.4 MG PO CAPS: 0.4000 mg | ORAL_CAPSULE | Freq: Every day | ORAL | 0 refills | Status: DC

## 2024-08-17 NOTE — Discharge Summary (Signed)
 Physician Discharge Summary  Krista Henderson FMW:[id number] DOB: [birth year] DOA: 08/15/2024  PCP: [name], DO  Admit date: 08/15/2024 Discharge date: 08/17/2024  Admitted From: Home Disposition: Home  Recommendations for Outpatient Follow-up:  Follow up with PCP in 1 week with repeat CBC/BMP Outpatient follow-up with urology Follow up in ED if symptoms worsen or new appear   Home Health: No Equipment/Devices: None  Discharge Condition: Stable CODE STATUS: Full Diet recommendation: Heart healthy  Brief/Interim Summary: 73 year old female with history of hyperlipidemia, vitamin D  deficiency, paroxysmal A-fib presents with nausea, vomiting and left flank pain along with dark urine.  She was found to have UTI and left ureteral stone.  She was started on IV fluids and antibiotics.  Urology was consulted.  Urology recommended conservative management with antibiotics and follow-up outpatient next week.  Urology has cleared the patient for discharge.  Patient will be discharged home today on oral Augmentin  as per urology recommendations.  Flomax has been started which we will continue.  Discharge Diagnoses:   Complicated UTI: Present on admission - Currently on IV Rocephin.  Urine cultures negative so far.  Hemodynamically stable.  Tolerating diet.  Discharge home today on oral Augmentin  as per urology recommendations: Final urine cultures can be followed up as an outpatient by urology  Left ureteral stone - Urology recommended conservative management with antibiotics and follow-up outpatient next week.  Urology has cleared the patient for discharge.  Continue Flomax  Paroxysmal A-fib - Outpatient follow-up with PCP; not on anticoagulation  Hyperlipidemia - Outpatient follow-up with PCP for need for medications  Thrombocytopenia - Questionable cause.  No signs of bleeding.  Monitor intermittently as an outpatient  Normocytic anemia - Questionable cause.  Hemoglobin  stable.  No signs of bleeding.  Outpatient follow-up  Discharge Instructions  Discharge Instructions     Ambulatory referral to Urology   Complete by: As directed    Diet general   Complete by: As directed    Increase activity slowly   Complete by: As directed       Allergies as of 08/17/2024       Reactions   Codeine    REACTION: rash/nausea/vomiting   Influenza Vaccines    injections site turned black color and had a hen size egg lump at site. Per pt she said not sure if the reactions was from a spider bite or flu shot.     Sulfa  Antibiotics Nausea And Vomiting        Medication List     TAKE these medications    amoxicillin -clavulanate 875-125 MG tablet Commonly known as: AUGMENTIN  Take 1 tablet by mouth 2 (two) times daily.   Calcium  Magnesium Zinc 333-133-5 MG Tabs Take 1 tablet by mouth daily.   cetirizine  10 MG tablet Commonly known as: ZyrTEC  Allergy Take 1 tablet (10 mg total) by mouth daily.   CITRACAL PO Take 1 capsule by mouth daily.   fluticasone  50 MCG/ACT nasal spray Commonly known as: FLONASE  Place 2 sprays into both nostrils daily.   ketorolac  10 MG tablet Commonly known as: TORADOL  Take 1 tablet (10 mg total) by mouth every 6 (six) hours as needed.   meclizine  25 MG tablet Commonly known as: ANTIVERT  Take 1 tablet (25 mg total) by mouth 3 (three) times daily as needed for dizziness.   multivitamin-lutein Caps capsule Take 1 capsule by mouth daily.   ondansetron  4 MG disintegrating tablet Commonly known as: ZOFRAN -ODT Take 1 tablet (4 mg total) by mouth every 8 (  eight) hours as needed for nausea or vomiting.   PROBIOTIC DAILY PO Take 1 capsule by mouth daily. Flogen   tamsulosin 0.4 MG Caps capsule Commonly known as: FLOMAX Take 1 capsule (0.4 mg total) by mouth daily.   traMADol 50 MG tablet Commonly known as: Ultram Take 1 tablet (50 mg total) by mouth every 6 (six) hours as needed.   triamcinolone  cream 0.1 % Commonly  known as: KENALOG  Apply 1 Application topically 2 (two) times daily as needed (ear dermatitis). X7-10 days   Vitamin D  (Ergocalciferol ) 1.25 MG (50000 UNIT) Caps capsule Commonly known as: DRISDOL  Take 1 capsule (50,000 Units total) by mouth every 7 (seven) days.        Follow-up Information     [facility]. Schedule an appointment as soon as possible for a visit .   Contact information: [address] [place]  [address] [phone]        [name], DO. Schedule an appointment as soon as possible for a visit in 1 week(s).   Specialty: Family Medicine Contact information: [address] [place] [address] [phone]                Allergies  Allergen Reactions   Codeine     REACTION: rash/nausea/vomiting   Influenza Vaccines     injections site turned black color and had a hen size egg lump at site. Per pt she said not sure if the reactions was from a spider bite or flu shot.     Sulfa  Antibiotics Nausea And Vomiting    Consultations: urology   Procedures/Studies: CT Renal Stone Study Result Date: 08/15/2024 CLINICAL DATA:  Left flank pain EXAM: CT ABDOMEN AND PELVIS WITHOUT CONTRAST TECHNIQUE: Multidetector CT imaging of the abdomen and pelvis was performed following the standard protocol without IV contrast. RADIATION DOSE REDUCTION: This exam was performed according to the departmental dose-optimization program which includes automated exposure control, adjustment of the mA and/or kV according to patient size and/or use of iterative reconstruction technique. COMPARISON:  CT chest abdomen and pelvis 11/08/2021. FINDINGS: Lower chest: No acute abnormality. Hepatobiliary: No focal liver abnormality is seen. No gallstones, gallbladder wall thickening, or biliary dilatation. Pancreas: Unremarkable. No pancreatic ductal dilatation or surrounding inflammatory changes. Spleen: Normal in size without focal abnormality.  Adrenals/Urinary Tract: There is a 4 mm calculus in the proximal left ureter. There is mild left-sided hydronephrosis. There is a punctate calculus in the inferior pole of the left kidney. There is a cyst in the superior pole of the right kidney measuring 10 mm. Adrenal glands are within normal limits. The bladder is completely decompressed. Stomach/Bowel: There is a small hiatal hernia. Stomach is otherwise within normal limits. Appendix is not seen. No evidence of bowel wall thickening, distention, or inflammatory changes. Vascular/Lymphatic: Aortic atherosclerosis. No enlarged abdominal or pelvic lymph nodes. Reproductive: Status post hysterectomy. No adnexal masses. Other: No abdominal wall hernia or abnormality. No abdominopelvic ascites. Musculoskeletal: Degenerative changes affect the spine and hips. IMPRESSION: 1. 4 mm calculus in the proximal left ureter with mild obstructive uropathy. 2. Nonobstructing left renal calculus. 3. Small hiatal hernia. 4. Aortic atherosclerosis. Aortic Atherosclerosis (ICD10-I70.0). Electronically Signed   By: Greig Pique M.D.   On: 08/15/2024 23:48      Subjective: Patient seen and examined at bedside.  Feels better and feels ready to go home today.  Denies any current nausea, vomiting, fever.  Discharge Exam: Vitals:   08/16/24 1930 08/17/24  0528  BP: 130/64 (!) 140/70  Pulse: 82 64  Resp: 20 20  Temp: 98.7 F (37.1 C) 97.8 F (36.6 C)  SpO2: 95% 95%    General: Pt is alert, awake, not in acute distress Cardiovascular: rate controlled, S1/S2 + Respiratory: bilateral decreased breath sounds at bases Abdominal: Soft, NT, ND, bowel sounds + Extremities: no edema, no cyanosis    The results of significant diagnostics from this hospitalization (including imaging, microbiology, ancillary and laboratory) are listed below for reference.     Microbiology: No results found for this or any previous visit (from the past 240 hours).   Labs: BNP (last 3  results) No results for input(s): BNP in the last 8760 hours. Basic Metabolic Panel: Recent Labs  Lab 08/15/24 2125 08/16/24 0730 08/17/24 0501  NA 142  --  141  K 3.9  --  4.1  CL 103  --  107  CO2 27  --  26  GLUCOSE 153*  --  123*  BUN 17  --  12  CREATININE 1.14*  --  0.82  CALCIUM  10.2  --  8.7*  MG  --  2.3 2.1   Liver Function Tests: No results for input(s): AST, ALT, ALKPHOS, BILITOT, PROT, ALBUMIN in the last 168 hours. No results for input(s): LIPASE, AMYLASE in the last 168 hours. No results for input(s): AMMONIA in the last 168 hours. CBC: Recent Labs  Lab 08/15/24 2125 08/17/24 0501  WBC 6.3 4.0  HGB 12.7 10.2*  HCT 37.4 30.5*  MCV 95.4 97.8  PLT 198 123*   Cardiac Enzymes: No results for input(s): CKTOTAL, CKMB, CKMBINDEX, TROPONINI in the last 168 hours. BNP: Invalid input(s): POCBNP CBG: Recent Labs  Lab 08/17/24 0738  GLUCAP 113*   D-Dimer No results for input(s): DDIMER in the last 72 hours. Hgb A1c No results for input(s): HGBA1C in the last 72 hours. Lipid Profile No results for input(s): CHOL, HDL, LDLCALC, TRIG, CHOLHDL, LDLDIRECT in the last 72 hours. Thyroid  function studies No results for input(s): TSH, T4TOTAL, T3FREE, THYROIDAB in the last 72 hours.  Invalid input(s): FREET3 Anemia work up No results for input(s): VITAMINB12, FOLATE, FERRITIN, TIBC, IRON, RETICCTPCT in the last 72 hours. Urinalysis    Component Value Date/Time   COLORURINE ORANGE (A) 08/15/2024 2125   APPEARANCEUR CLOUDY (A) 08/15/2024 2125   APPEARANCEUR Clear 01/16/2019 1027   LABSPEC 1.028 08/15/2024 2125   PHURINE 5.5 08/15/2024 2125   GLUCOSEU NEGATIVE 08/15/2024 2125   HGBUR LARGE (A) 08/15/2024 2125   BILIRUBINUR NEGATIVE 08/15/2024 2125   BILIRUBINUR n 07/16/2020 1356   BILIRUBINUR Negative 01/16/2019 1027   KETONESUR TRACE (A) 08/15/2024 2125   PROTEINUR 30 (A) 08/15/2024 2125    UROBILINOGEN negative (A) 07/16/2020 1356   NITRITE NEGATIVE 08/15/2024 2125   LEUKOCYTESUR TRACE (A) 08/15/2024 2125   Sepsis Labs Recent Labs  Lab 08/15/24 2125 08/17/24 0501  WBC 6.3 4.0   Microbiology No results found for this or any previous visit (from the past 240 hours).   Time coordinating discharge: 35 minutes  SIGNED:   Sophie Mao, MD  [facility] 08/17/2024, 7:55 AM

## 2024-08-17 NOTE — TOC Transition Note (Signed)
 Transition of Care [facility]) - Discharge Note   Patient Details  Name: Krista Henderson MRN: [id number] Date of Birth: 05-03-1951  Transition of Care [facility]) CM/SW Contact:  Sonda Manuella Quill, RN Phone Number: 08/17/2024, 9:00 AM   Clinical Narrative:    D/C orders received; no IP CM needs.   Final next level of care: Home/Self Care Barriers to Discharge: No Barriers Identified   Patient Goals and CMS Choice Patient states their goals for this hospitalization and ongoing recovery are:: home     [facility] ownership interest in [facility].provided to:: Patient    Discharge Placement                       Discharge Plan and Services Additional resources added to the After Visit Summary for     Discharge Planning Services: CM Consult            DME Arranged: N/A DME Agency: NA       HH Arranged: NA HH Agency: NA        Social Drivers of Health (SDOH) Interventions SDOH Screenings   Food Insecurity: No Food Insecurity (08/16/2024)  Housing: Low Risk  (08/16/2024)  Transportation Needs: No Transportation Needs (08/16/2024)  Utilities: Not At Risk (08/16/2024)  Depression (PHQ2-9): Low Risk  (08/05/2024)  Financial Resource Strain: Low Risk  (07/22/2024)  Physical Activity: Sufficiently Active (07/22/2024)  Social Connections: Socially Integrated (08/16/2024)  Stress: No Stress Concern Present (07/22/2024)  Tobacco Use: Low Risk  (08/15/2024)     Readmission Risk Interventions     No data to display

## 2024-08-17 NOTE — Plan of Care (Signed)
   Problem: Coping: Goal: Level of anxiety will decrease Outcome: Progressing   Problem: Safety: Goal: Ability to remain free from injury will improve Outcome: Progressing   Problem: Skin Integrity: Goal: Risk for impaired skin integrity will decrease Outcome: Progressing

## 2024-08-19 ENCOUNTER — Other Ambulatory Visit: Payer: Self-pay | Admitting: Urology

## 2024-08-19 DIAGNOSIS — N201 Calculus of ureter: Secondary | ICD-10-CM | POA: Diagnosis not present

## 2024-08-19 LAB — GLUCOSE, CAPILLARY: Glucose-Capillary: 107 mg/dL — ABNORMAL HIGH (ref 70–99)

## 2024-08-20 ENCOUNTER — Encounter (HOSPITAL_COMMUNITY): Payer: Self-pay | Admitting: Urology

## 2024-08-20 NOTE — Progress Notes (Signed)
 Attempted to obtain medical history for pre op call via telephone, unable to reach at this time. HIPAA compliant voicemail message left requesting return call to pre surgical testing department.

## 2024-08-21 ENCOUNTER — Encounter (INDEPENDENT_AMBULATORY_CARE_PROVIDER_SITE_OTHER): Payer: Self-pay

## 2024-08-21 NOTE — Progress Notes (Signed)
 LITHO PREOP PHONE CALL   ALLERGIES REVIEWED: YES  MEDICATION REVIEW DONE: YES MEDICATIONS THAT PT SHOULD HOLD (LIST): Tordol hold 24hr, NSAIDS hold 48hr  CAN PT WALK UP STAIRS WITHOUT SHORTNESS OF BREATH: YES HOME O2: NO CPAP: NO  IF YES, INFORMED PT TO BRING CPAP WITH TUBING AND MASK:YES/NO   INFORMED DRIVER NEEDED FOR PROCEDURE: YES   PT WAS GIVEN BLUE FOLDER AT UROLOGY APPT: YES PT INFORMED TO BRING BLUE FOLDER WITH ALL CONTENTS: YES  REVIEWED ARRIVAL TIME AND LOCATION: YES  OTHER PERTINENT INFORMATION:

## 2024-08-22 ENCOUNTER — Encounter (HOSPITAL_COMMUNITY): Payer: Self-pay | Admitting: Urology

## 2024-08-22 ENCOUNTER — Ambulatory Visit (HOSPITAL_COMMUNITY)

## 2024-08-22 ENCOUNTER — Other Ambulatory Visit: Payer: Self-pay

## 2024-08-22 ENCOUNTER — Encounter (HOSPITAL_COMMUNITY): Admission: RE | Disposition: A | Payer: Self-pay | Source: Home / Self Care | Attending: Urology

## 2024-08-22 ENCOUNTER — Ambulatory Visit (HOSPITAL_COMMUNITY): Admission: RE | Admit: 2024-08-22 | Discharge: 2024-08-22 | Disposition: A | Attending: Urology | Admitting: Urology

## 2024-08-22 DIAGNOSIS — N200 Calculus of kidney: Secondary | ICD-10-CM | POA: Diagnosis not present

## 2024-08-22 DIAGNOSIS — N201 Calculus of ureter: Secondary | ICD-10-CM | POA: Diagnosis not present

## 2024-08-22 HISTORY — PX: EXTRACORPOREAL SHOCK WAVE LITHOTRIPSY: SHX1557

## 2024-08-22 SURGERY — LITHOTRIPSY, ESWL
Anesthesia: LOCAL | Laterality: Left

## 2024-08-22 MED ORDER — CIPROFLOXACIN HCL 500 MG PO TABS
500.0000 mg | ORAL_TABLET | ORAL | Status: AC
  Administered 2024-08-22: 500 mg via ORAL
  Filled 2024-08-22: qty 1

## 2024-08-22 MED ORDER — DIPHENHYDRAMINE HCL 25 MG PO CAPS
25.0000 mg | ORAL_CAPSULE | ORAL | Status: AC
  Administered 2024-08-22: 25 mg via ORAL
  Filled 2024-08-22: qty 1

## 2024-08-22 MED ORDER — SODIUM CHLORIDE 0.9 % IV SOLN: INTRAVENOUS | Status: DC

## 2024-08-22 MED ORDER — DIAZEPAM 5 MG PO TABS
10.0000 mg | ORAL_TABLET | ORAL | Status: AC
  Administered 2024-08-22: 5 mg via ORAL
  Filled 2024-08-22: qty 2

## 2024-08-22 NOTE — Discharge Instructions (Signed)
 See [facility] discharge instructions in chart.

## 2024-08-22 NOTE — Interval H&P Note (Signed)
 History and Physical Interval Note:  08/22/2024 8:45 AM  Krista Henderson  has presented today for surgery, with the diagnosis of LEFT URETERAL STONE.  The various methods of treatment have been discussed with the patient and family. After consideration of risks, benefits and other options for treatment, the patient has consented to  Procedure(s): LITHOTRIPSY, ESWL (Left) as a surgical intervention.  The patient's history has been reviewed, patient examined, no change in status, stable for surgery.  I have reviewed the patient's chart and labs.  Questions were answered to the patient's satisfaction.     Philip Eckersley D Azyriah Nevins

## 2024-08-22 NOTE — Op Note (Signed)
 See [employer] [facility] OP note scanned into chart. Also because of the size, density, location and other factors that cannot be anticipated I feel this will likely be a staged procedure. This fact supersedes any indication in the scanned [place] stone operative note to the contrary.

## 2024-08-24 ENCOUNTER — Encounter (HOSPITAL_COMMUNITY): Payer: Self-pay | Admitting: Urology

## 2024-09-04 DIAGNOSIS — N201 Calculus of ureter: Secondary | ICD-10-CM | POA: Diagnosis not present

## 2024-09-15 ENCOUNTER — Other Ambulatory Visit: Payer: Self-pay | Admitting: Obstetrics & Gynecology

## 2024-09-15 DIAGNOSIS — Z1231 Encounter for screening mammogram for malignant neoplasm of breast: Secondary | ICD-10-CM

## 2024-09-22 ENCOUNTER — Ambulatory Visit

## 2024-09-22 ENCOUNTER — Ambulatory Visit (INDEPENDENT_AMBULATORY_CARE_PROVIDER_SITE_OTHER)

## 2024-09-22 DIAGNOSIS — Z23 Encounter for immunization: Secondary | ICD-10-CM | POA: Diagnosis not present

## 2024-09-23 ENCOUNTER — Ambulatory Visit: Payer: Self-pay

## 2024-09-23 ENCOUNTER — Telehealth: Admitting: Family Medicine

## 2024-09-23 ENCOUNTER — Encounter: Payer: Self-pay | Admitting: Family Medicine

## 2024-09-23 ENCOUNTER — Telehealth: Payer: Self-pay | Admitting: Family Medicine

## 2024-09-23 DIAGNOSIS — R350 Frequency of micturition: Secondary | ICD-10-CM | POA: Diagnosis not present

## 2024-09-23 LAB — URINALYSIS, ROUTINE W REFLEX MICROSCOPIC
Bilirubin, UA: NEGATIVE
Glucose, UA: NEGATIVE
Ketones, UA: NEGATIVE
Leukocytes,UA: NEGATIVE
Nitrite, UA: NEGATIVE
Protein,UA: NEGATIVE
RBC, UA: NEGATIVE
Specific Gravity, UA: 1.015 (ref 1.005–1.030)
Urobilinogen, Ur: 0.2 mg/dL (ref 0.2–1.0)
pH, UA: 6 (ref 5.0–7.5)

## 2024-09-23 NOTE — Telephone Encounter (Signed)
 Returned patient call to relay Dr. Melba note. Patient stated that she did not understand why Brinda Medora HERO, RN said that she could work her in with Dr. Jolinda but I was telling her that we could not do that. I assured patient that I could work her in for Monday but not before and that Paulette did not have the power to do that. I advised patient to go to urgent care for UTI or I could get her in for a virtual visit with the doctor of the day, patient stated that she did not want to go to urgent care and only wanted to see Dr. Jolinda. I again, told patient that we did not have availability, patient then said BYE and disconnected the call.

## 2024-09-23 NOTE — Telephone Encounter (Signed)
 FYI Only or Action Required?: Action required by provider: request for appointment. Also requesting referral to therapy. Pt will only see Dr. Jolinda.  Call was from daughter, Tinnie. Lauren is not on DPR, but would like to be the person to be called back to schedule appt for today.  Patient was last seen in primary care on 08/05/2024 by [name], DO.  Called Nurse Triage reporting Anxiety. Pt also report urinary frequency. Symptoms began about a month ago.  Interventions attempted: Nothing.  Symptoms are: rapidly worsening.  Triage Disposition: See Physician Within 24 Hours  Patient/caregiver understands and will follow disposition?: No - Pt will only see Dr. Jolinda                   Copied from CRM [id number]. Topic: Clinical - Red Word Triage >> Sep 23, 2024  8:58 AM Rosaria A wrote: Red Word that prompted transfer to Nurse Triage: Tinnie patients daughter states that she is having severe anxiety  uti  kidney stones Reason for Disposition  Patient sounds very upset or troubled to the triager  Answer Assessment - Initial Assessment Questions 1. CONCERN: Did anything happen that prompted you to call today?      Pt did not sleep well.  2. ANXIETY SYMPTOMS: Can you describe how you (your loved one; patient) have been feeling? (e.g., tense, restless, panicky, anxious, keyed up, overwhelmed, sense of impending doom).      Pt is having marital issues, and Urinary frequency 3. ONSET: How long have you been feeling this way? (e.g., hours, days, weeks)     1 month - worsening 4. SEVERITY: How would you rate the level of anxiety? (e.g., 0 - 10; or mild, moderate, severe).     moderate 5. FUNCTIONAL IMPAIRMENT: How have these feelings affected your ability to do daily activities? Have you had more difficulty than usual doing your normal daily activities? (e.g., getting better, same, worse; self-care, school, work, interactions)     unsure 6.  HISTORY: Have you felt this way before? Have you ever been diagnosed with an anxiety problem in the past? (e.g., generalized anxiety disorder, panic attacks, PTSD). If Yes, ask: How was this problem treated? (e.g., medicines, counseling, etc.)     Did not ask 7. RISK OF HARM - SUICIDAL IDEATION: Do you ever have thoughts of hurting or killing yourself? If Yes, ask:  Do you have these feelings now? Do you have a plan on how you would do this?     no 8. TREATMENT:  What has been done so far to treat this anxiety? (e.g., medicines, relaxation strategies). What has helped?     nothing 9. THERAPIST: Do you have a counselor or therapist? If Yes, ask: What is their name?     No - would like one  11. PATIENT SUPPORT: Who is with you now? Who do you live with? Do you have family or friends who you can talk to?        Daughter called for help 12. OTHER SYMPTOMS: Do you have any other symptoms? (e.g., feeling depressed, trouble concentrating, trouble sleeping, trouble breathing, palpitations or fast heartbeat, chest pain, sweating, nausea, or diarrhea)       Urinary frequency  Protocols used: Anxiety and Panic Attack-A-AH

## 2024-09-23 NOTE — Progress Notes (Signed)
 "   Virtual Visit via Video   I connected with patient on 09/23/2024 at 1250 by a video enabled telemedicine application and verified that I am speaking with the correct person using two identifiers.  Location patient: Home Location provider: [facility] Persons participating in the virtual visit: Patient and Provider  I discussed the limitations of evaluation and management by telemedicine and the availability of in person appointments. The patient expressed understanding and agreed to proceed.  Subjective:   HPI:  Pt presents today for  Chief Complaint  Patient presents with   Urinary Frequency    Krista Henderson is a 74 year old female who presents for a recheck of her urine.  She experiences frequent urination, noting that she went to the bathroom approximately every hour last night. She also reports chills but denies having a fever. She received a flu shot the previous day, which she believes may be related to the chills.  Her medical history includes a kidney stone and urinary tract infection that occurred simultaneously, necessitating hospitalization. She was unable to pass the kidney stone, and surgical intervention was postponed due to the urinary tract infection, as there was concern about exacerbating the infection.  She denies fever and blood in the urine. No flank pain, weakness, or confusion. No vaginal symptoms.      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  Resting comfortably at home.  Head is normocephalic, atraumatic.  No labored breathing.  Speech is clear and coherent with logical content.  Patient is alert and oriented at baseline.    Assessment and Plan:   Krista Henderson was seen today for urinary frequency.  Diagnoses and all orders for this visit:  Urinary frequency -     Urine Culture -     Urinalysis, Routine w reflex microscopic      Urinary frequency Increased urinary frequency with urinalysis showing no signs of infection, including absence of leukocytes, nitrites, and bacteria. No hematuria. Recent chills possibly related to flu vaccination. Differential diagnosis includes urinary tract infection, but current urinalysis does not support this. - Ordered urine culture to rule out infection - If urine culture is positive, will initiate treatment for infection        Return if symptoms worsen or fail to improve.  Rosaline Bruns, FNP-C [facility] [address] [place], [place] [address] [phone]  09/23/2024  Time spent with the patient: 10 minutes, of which >50% was spent in obtaining information about symptoms, reviewing previous labs, evaluations, and treatments, counseling about condition (please see the discussed topics above), and developing a plan to further investigate it; had a number of questions which I addressed.      [1]  Current Outpatient Medications:    amoxicillin -clavulanate (AUGMENTIN ) 875-125  MG tablet, Take 1 tablet by mouth 2 (two) times daily., Disp: 14 tablet, Rfl: 0   Calcium  Citrate (CITRACAL PO), Take 1 capsule by mouth daily., Disp: , Rfl:    Calcium  Magnesium Zinc 333-133-5 MG TABS, Take 1 tablet by mouth daily., Disp: , Rfl:    cetirizine  (ZYRTEC  ALLERGY)  10 MG tablet, Take 1 tablet (10 mg total) by mouth daily., Disp: 90 tablet, Rfl: 1   fluticasone  (FLONASE ) 50 MCG/ACT nasal spray, Place 2 sprays into both nostrils daily., Disp: 16 g, Rfl: 6   ketorolac  (TORADOL ) 10 MG tablet, Take 1 tablet (10 mg total) by mouth every 6 (six) hours as needed., Disp: 20 tablet, Rfl: 0   meclizine  (ANTIVERT ) 25 MG tablet, Take 1 tablet (25 mg total) by mouth 3 (three) times daily as needed for dizziness. (Patient not taking: Reported on 08/05/2024), Disp: 30 tablet, Rfl: 0   multivitamin-lutein (OCUVITE-LUTEIN) CAPS capsule, Take 1 capsule by mouth daily. (Patient not taking: Reported on 08/16/2024), Disp: , Rfl:    ondansetron  (ZOFRAN -ODT) 4 MG disintegrating tablet, Take 1 tablet (4 mg total) by mouth every 8 (eight) hours as needed for nausea or vomiting., Disp: 20 tablet, Rfl: 0   Probiotic Product (PROBIOTIC DAILY PO), Take 1 capsule by mouth daily. Flogen, Disp: , Rfl:    tamsulosin  (FLOMAX ) 0.4 MG CAPS capsule, Take 1 capsule (0.4 mg total) by mouth daily., Disp: 15 capsule, Rfl: 0   traMADol  (ULTRAM ) 50 MG tablet, Take 1 tablet (50 mg total) by mouth every 6 (six) hours as needed., Disp: 20 tablet, Rfl: 0   triamcinolone  cream (KENALOG ) 0.1 %, Apply 1 Application topically 2 (two) times daily as needed (ear dermatitis). X7-10 days (Patient not taking: Reported on 08/05/2024), Disp: 30 g, Rfl: 0   Vitamin D , Ergocalciferol , (DRISDOL ) 1.25 MG (50000 UNIT) CAPS capsule, Take 1 capsule (50,000 Units total) by mouth every 7 (seven) days., Disp: 12 capsule, Rfl: 4 [2]  Allergies Allergen Reactions   Codeine     REACTION: rash/nausea/vomiting   Influenza Vaccines     injections site turned black color and had a hen size egg lump at site. Per pt she said not sure if the reactions was from a spider bite or flu shot.     Sulfa  Antibiotics Nausea And Vomiting   "

## 2024-09-23 NOTE — Telephone Encounter (Signed)
 Patient called the office and asked to come in and leave a urine sample and do a virtual visit with whoever is available. Patient scheduled to do virtual visit with Rosaline Bruns, NP.

## 2024-09-23 NOTE — Telephone Encounter (Signed)
 Copied from CRM #[id number]. Topic: General - Other >> Sep 23, 2024 12:38 PM Carrielelia G wrote: Reason for CRM: Patient stated she was returning kims call.

## 2024-09-23 NOTE — Telephone Encounter (Signed)
 Sadly, we have nothing open currently but I can get her in on the same day on Monday.  My concern is is that if she is having UTI symptoms she would likely need to be seen prior to that time

## 2024-09-23 NOTE — Telephone Encounter (Signed)
 Nothing need. Pt already had video visit today and concerns were addressed. LS

## 2024-09-24 ENCOUNTER — Ambulatory Visit: Payer: Self-pay | Admitting: Family Medicine

## 2024-09-25 LAB — URINE CULTURE

## 2024-09-29 ENCOUNTER — Ambulatory Visit (INDEPENDENT_AMBULATORY_CARE_PROVIDER_SITE_OTHER): Payer: Medicare HMO | Admitting: Obstetrics & Gynecology

## 2024-09-29 ENCOUNTER — Encounter (HOSPITAL_BASED_OUTPATIENT_CLINIC_OR_DEPARTMENT_OTHER): Payer: Self-pay | Admitting: Obstetrics & Gynecology

## 2024-09-29 VITALS — BP 125/77 | HR 83 | Ht 65.0 in | Wt 153.4 lb

## 2024-09-29 DIAGNOSIS — E559 Vitamin D deficiency, unspecified: Secondary | ICD-10-CM

## 2024-09-29 DIAGNOSIS — N952 Postmenopausal atrophic vaginitis: Secondary | ICD-10-CM

## 2024-09-29 DIAGNOSIS — Z90710 Acquired absence of both cervix and uterus: Secondary | ICD-10-CM | POA: Diagnosis not present

## 2024-09-29 DIAGNOSIS — Z9189 Other specified personal risk factors, not elsewhere classified: Secondary | ICD-10-CM

## 2024-09-29 DIAGNOSIS — F430 Acute stress reaction: Secondary | ICD-10-CM

## 2024-09-29 DIAGNOSIS — Z01419 Encounter for gynecological examination (general) (routine) without abnormal findings: Secondary | ICD-10-CM

## 2024-09-29 DIAGNOSIS — Z87440 Personal history of urinary (tract) infections: Secondary | ICD-10-CM | POA: Diagnosis not present

## 2024-09-29 DIAGNOSIS — N390 Urinary tract infection, site not specified: Secondary | ICD-10-CM

## 2024-09-29 LAB — POCT URINALYSIS DIP (CLINITEK)
Bilirubin, UA: NEGATIVE
Glucose, UA: NEGATIVE mg/dL
Leukocytes, UA: NEGATIVE
Nitrite, UA: NEGATIVE
POC PROTEIN,UA: NEGATIVE
Spec Grav, UA: >=1.03 — AB
Urobilinogen, UA: 0.2 U/dL
pH, UA: 5.5

## 2024-09-29 MED ORDER — ESTRADIOL 0.01 % VA CREA: TOPICAL_CREAM | VAGINAL | 3 refills | Status: AC

## 2024-09-29 MED ORDER — ALPRAZOLAM 0.25 MG PO TABS: 0.2500 mg | ORAL_TABLET | Freq: Every day | ORAL | 0 refills | Status: AC | PRN

## 2024-09-29 NOTE — Progress Notes (Unsigned)
 "  Bresat and Pelvic Exam Patient name: Krista Henderson MRN [id number]  Date of birth: 11-21-1950 Chief Complaint:   Gynecologic Exam  History of Present Illness:   Krista Henderson is a 74 y.o. G65P2002 Caucasian female being seen today for breast and pelvic exam.  Denies vaginal bleeding.  Had UTI and then kidney stone right after Thanksgiving.  She is having a lot of dryness.    Situational stressors with spouse.  Feels she needs some thing to take intermittently right now while working through some medical issues.    Patient's last menstrual period was 08/18/1989.  Last pap: Hysterectomy. H/O abnormal pap: no Last mammogram: 10/15/2023. Results were: normal. Patient scheduled for next one on 10/17/2023. Family h/o breast cancer: yes paternal aunt Last colonoscopy: 04/18/2022. Results were: abnormal 3 polyps. Family h/o colorectal cancer: yes father.  Follow up 5 years.       08/05/2024    2:38 PM 06/23/2024    8:38 AM 12/18/2023   10:24 AM 09/10/2023   11:14 AM 12/12/2022    1:19 PM  Depression screen PHQ 2/9  Decreased Interest 0 0 0 0 0  Down, Depressed, Hopeless 0 0 0 0 0  PHQ - 2 Score 0 0 0 0 0  Altered sleeping 0 0 0  0  Tired, decreased energy 0 0 0  0  Change in appetite 0 0 0  0  Feeling bad or failure about yourself  0 0 0  0  Trouble concentrating 0 0 0  0  Moving slowly or fidgety/restless 0 0 0  0  Suicidal thoughts 0 0 0  0  PHQ-9 Score 0 0  0   0   Difficult doing work/chores Not difficult at all Not difficult at all Not difficult at all  Not difficult at all     Data saved with a previous flowsheet row definition        08/05/2024    2:38 PM 06/23/2024    8:39 AM 12/18/2023   10:24 AM 06/19/2023   11:09 AM  GAD 7 : Generalized Anxiety Score  Nervous, Anxious, on Edge 0 0 0 0  Control/stop worrying 0 0 0 0  Worry too much - different things 0 0 0   Trouble relaxing 0 0 0   Restless 0 0 0   Easily annoyed or irritable 0 0 0   Afraid - awful might happen 0 0  0   Total GAD 7 Score 0 0 0   Anxiety Difficulty Not difficult at all Not difficult at all Not difficult at all      Review of Systems:   Pertinent items are noted in HPI Denies any headaches, blurred vision, fatigue, shortness of breath, chest pain, abdominal pain, abnormal vaginal discharge/itching/odor/irritation, problems with periods, bowel movements, urination, or intercourse unless otherwise stated above. Pertinent History Reviewed:  Reviewed past medical,surgical, social and family history.  Reviewed problem list, medications and allergies. Physical Assessment:   Vitals:   09/29/24 1501  BP: 125/77  Pulse: 83  SpO2: 100%  Weight: 153 lb 6.4 oz (69.6 kg)  Height: 5' 5 (1.651 m)  Body mass index is 25.53 kg/m.        Physical Examination:   General appearance - well appearing, and in no distress  Mental status - alert, oriented to person, place, and time  Psych:  She has a normal mood and affect  Skin - warm and dry, normal color, no suspicious lesions noted  Chest - effort normal, all lung fields clear to auscultation bilaterally  Heart - normal rate and regular rhythm  Neck:  midline trachea, no thyromegaly or nodules  Breasts - breasts appear normal, no suspicious masses, no skin or nipple changes or  axillary nodes  Abdomen - soft, nontender, nondistended, no masses or organomegaly  Pelvic - VULVA: normal appearing vulva with no masses, tenderness or lesions   VAGINA: normal appearing vagina with normal color and discharge, no lesions   CERVIX: surgically absent  Thin prep pap is not indicated  UTERUS: surgically absent  ADNEXA: No adnexal masses or tenderness noted.  Rectal - normal rectal, good sphincter tone, no masses felt  Extremities:  No swelling or varicosities noted  Chaperone present for exam  No results found for this or any previous visit (from the past 24 hours).   Assessment & Plan:  1. GYN exam for high-risk Medicare patient (Primary) - Pap  smear not indicated - Mammogram 10/15/2023 - Colonoscopy 04/18/2022 - Bone mineral density 12/19/2023 - lab work done with PCP, Dr. Jolinda - vaccines reviewed/updated  2. History of UTI - POCT URINALYSIS DIP (CLINITEK) - Urine Culture  3. H/O: hysterectomy  4. Vitamin D  deficiency -taking 50K every week  5. Vaginal dryness  - topical vaginal estradiol  cream discussed.  Rx sent to pharmacy on file to use 1 gram pv 1-2 times weekly  6. 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  Dispense: 20 tablet; Refill: 0 - pt knows to use sparingly    Orders Placed This Encounter  Procedures   Urine Culture   POCT URINALYSIS DIP (CLINITEK)    Meds:  Meds ordered this encounter  Medications   estradiol  (ESTRACE ) 0.01 % CREA vaginal cream    Sig: 1 gram vaginally twice weekly    Dispense:  42.5 g    Refill:  3   ALPRAZolam  (XANAX ) 0.25 MG tablet    Sig: Take 1 tablet (0.25 mg total) by mouth daily as needed for sleep.    Dispense:  20 tablet    Refill:  0    Follow-up: Return for 1-2 years.  Ronal GORMAN [place], MD 10/02/2024 10:46 PM "

## 2024-09-30 ENCOUNTER — Ambulatory Visit: Admitting: Family Medicine

## 2024-10-02 ENCOUNTER — Ambulatory Visit (HOSPITAL_BASED_OUTPATIENT_CLINIC_OR_DEPARTMENT_OTHER): Payer: Self-pay | Admitting: Obstetrics & Gynecology

## 2024-10-02 LAB — URINE CULTURE

## 2024-10-16 ENCOUNTER — Ambulatory Visit
Admission: RE | Admit: 2024-10-16 | Discharge: 2024-10-16 | Disposition: A | Discharge status: Home / Self Care | Source: Ambulatory Visit | Attending: Obstetrics & Gynecology | Admitting: Obstetrics & Gynecology

## 2024-10-16 DIAGNOSIS — Z1231 Encounter for screening mammogram for malignant neoplasm of breast: Secondary | ICD-10-CM

## 2024-12-18 ENCOUNTER — Ambulatory Visit: Payer: Self-pay

## 2025-01-14 ENCOUNTER — Ambulatory Visit: Admitting: Cardiology
